# Patient Record
Sex: Male | Born: 1935 | Race: Black or African American | Hispanic: No | Smoking: Former smoker
Health system: Southern US, Community
[De-identification: ages and names within clinical notes are randomized; demographics above are authoritative.]

## PROBLEM LIST (undated history)

## (undated) DIAGNOSIS — I5022 Chronic systolic (congestive) heart failure: Secondary | ICD-10-CM

## (undated) DIAGNOSIS — K279 Peptic ulcer, site unspecified, unspecified as acute or chronic, without hemorrhage or perforation: Secondary | ICD-10-CM

## (undated) DIAGNOSIS — I2510 Atherosclerotic heart disease of native coronary artery without angina pectoris: Secondary | ICD-10-CM

## (undated) DIAGNOSIS — I10 Essential (primary) hypertension: Secondary | ICD-10-CM

## (undated) DIAGNOSIS — E785 Hyperlipidemia, unspecified: Secondary | ICD-10-CM

## (undated) DIAGNOSIS — I509 Heart failure, unspecified: Secondary | ICD-10-CM

## (undated) DIAGNOSIS — T827XXA Infection and inflammatory reaction due to other cardiac and vascular devices, implants and grafts, initial encounter: Secondary | ICD-10-CM

## (undated) DIAGNOSIS — N400 Enlarged prostate without lower urinary tract symptoms: Secondary | ICD-10-CM

## (undated) DIAGNOSIS — N189 Chronic kidney disease, unspecified: Secondary | ICD-10-CM

## (undated) DIAGNOSIS — K922 Gastrointestinal hemorrhage, unspecified: Secondary | ICD-10-CM

## (undated) DIAGNOSIS — I428 Other cardiomyopathies: Secondary | ICD-10-CM

## (undated) DIAGNOSIS — I219 Acute myocardial infarction, unspecified: Secondary | ICD-10-CM

## (undated) DIAGNOSIS — E119 Type 2 diabetes mellitus without complications: Secondary | ICD-10-CM

## (undated) DIAGNOSIS — R0602 Shortness of breath: Secondary | ICD-10-CM

## (undated) HISTORY — DX: Other cardiomyopathies: I42.8

## (undated) HISTORY — PX: ADENOIDECTOMY: SHX5191

## (undated) HISTORY — PX: DOPPLER ECHOCARDIOGRAPHY: SHX263

## (undated) HISTORY — PX: EYE SURGERY: SHX253

## (undated) HISTORY — DX: Hyperlipidemia, unspecified: E78.5

## (undated) HISTORY — PX: LACERATION REPAIR: SHX5168

## (undated) HISTORY — DX: Benign prostatic hyperplasia without lower urinary tract symptoms: N40.0

## (undated) HISTORY — DX: Type 2 diabetes mellitus without complications: E11.9

## (undated) HISTORY — PX: CATARACT EXTRACTION: SUR2

## (undated) HISTORY — PX: TONSILLECTOMY: SUR1361

## (undated) HISTORY — DX: Infection and inflammatory reaction due to other cardiac and vascular devices, implants and grafts, initial encounter: T82.7XXA

## (undated) HISTORY — DX: Chronic systolic (congestive) heart failure: I50.22

---

## 1988-09-20 DIAGNOSIS — I219 Acute myocardial infarction, unspecified: Secondary | ICD-10-CM

## 1988-09-20 HISTORY — PX: CERVICAL DISC SURGERY: SHX588

## 1988-09-20 HISTORY — DX: Acute myocardial infarction, unspecified: I21.9

## 1997-08-30 ENCOUNTER — Ambulatory Visit (HOSPITAL_COMMUNITY): Admission: RE | Admit: 1997-08-30 | Discharge: 1997-08-30 | Payer: Self-pay

## 1999-04-12 ENCOUNTER — Ambulatory Visit (HOSPITAL_COMMUNITY): Admission: RE | Admit: 1999-04-12 | Discharge: 1999-04-12 | Payer: Self-pay | Admitting: Gastroenterology

## 1999-04-12 ENCOUNTER — Encounter (INDEPENDENT_AMBULATORY_CARE_PROVIDER_SITE_OTHER): Payer: Self-pay | Admitting: Specialist

## 1999-11-07 ENCOUNTER — Inpatient Hospital Stay (HOSPITAL_COMMUNITY): Admission: EM | Admit: 1999-11-07 | Discharge: 1999-11-08 | Payer: Self-pay | Admitting: Emergency Medicine

## 1999-11-07 ENCOUNTER — Encounter: Payer: Self-pay | Admitting: Emergency Medicine

## 1999-11-08 ENCOUNTER — Encounter: Payer: Self-pay | Admitting: Internal Medicine

## 2001-02-17 ENCOUNTER — Encounter: Payer: Self-pay | Admitting: Family Medicine

## 2001-02-17 ENCOUNTER — Ambulatory Visit (HOSPITAL_COMMUNITY): Admission: RE | Admit: 2001-02-17 | Discharge: 2001-02-17 | Payer: Self-pay | Admitting: Family Medicine

## 2001-02-19 ENCOUNTER — Encounter: Payer: Self-pay | Admitting: Internal Medicine

## 2001-02-19 ENCOUNTER — Inpatient Hospital Stay (HOSPITAL_COMMUNITY): Admission: EM | Admit: 2001-02-19 | Discharge: 2001-03-01 | Payer: Self-pay | Admitting: Internal Medicine

## 2001-02-20 ENCOUNTER — Encounter: Payer: Self-pay | Admitting: Internal Medicine

## 2001-02-21 ENCOUNTER — Encounter: Payer: Self-pay | Admitting: Internal Medicine

## 2001-02-22 ENCOUNTER — Encounter: Payer: Self-pay | Admitting: Pulmonary Disease

## 2001-02-23 ENCOUNTER — Encounter: Payer: Self-pay | Admitting: Internal Medicine

## 2001-02-24 ENCOUNTER — Encounter: Payer: Self-pay | Admitting: Critical Care Medicine

## 2001-02-25 ENCOUNTER — Encounter: Payer: Self-pay | Admitting: Critical Care Medicine

## 2001-10-15 ENCOUNTER — Ambulatory Visit (HOSPITAL_BASED_OUTPATIENT_CLINIC_OR_DEPARTMENT_OTHER): Admission: RE | Admit: 2001-10-15 | Discharge: 2001-10-15 | Payer: Self-pay | Admitting: Critical Care Medicine

## 2001-10-16 ENCOUNTER — Encounter: Payer: Self-pay | Admitting: Pulmonary Disease

## 2002-01-31 ENCOUNTER — Encounter: Payer: Self-pay | Admitting: Emergency Medicine

## 2002-01-31 ENCOUNTER — Inpatient Hospital Stay (HOSPITAL_COMMUNITY): Admission: EM | Admit: 2002-01-31 | Discharge: 2002-02-03 | Payer: Self-pay | Admitting: Emergency Medicine

## 2002-05-12 ENCOUNTER — Emergency Department (HOSPITAL_COMMUNITY): Admission: EM | Admit: 2002-05-12 | Discharge: 2002-05-12 | Payer: Self-pay | Admitting: Emergency Medicine

## 2002-05-12 ENCOUNTER — Encounter: Payer: Self-pay | Admitting: Emergency Medicine

## 2002-05-15 ENCOUNTER — Encounter: Payer: Self-pay | Admitting: Emergency Medicine

## 2002-05-15 ENCOUNTER — Inpatient Hospital Stay (HOSPITAL_COMMUNITY): Admission: EM | Admit: 2002-05-15 | Discharge: 2002-05-16 | Payer: Self-pay | Admitting: Emergency Medicine

## 2002-05-16 ENCOUNTER — Encounter: Payer: Self-pay | Admitting: *Deleted

## 2002-06-22 ENCOUNTER — Ambulatory Visit (HOSPITAL_COMMUNITY): Admission: RE | Admit: 2002-06-22 | Discharge: 2002-06-22 | Payer: Self-pay | Admitting: Gastroenterology

## 2002-06-22 ENCOUNTER — Encounter (INDEPENDENT_AMBULATORY_CARE_PROVIDER_SITE_OTHER): Payer: Self-pay | Admitting: Specialist

## 2002-10-02 ENCOUNTER — Encounter: Payer: Self-pay | Admitting: Pulmonary Disease

## 2003-02-27 ENCOUNTER — Inpatient Hospital Stay (HOSPITAL_COMMUNITY): Admission: EM | Admit: 2003-02-27 | Discharge: 2003-03-02 | Payer: Self-pay | Admitting: Emergency Medicine

## 2003-07-17 ENCOUNTER — Inpatient Hospital Stay (HOSPITAL_COMMUNITY): Admission: EM | Admit: 2003-07-17 | Discharge: 2003-07-22 | Payer: Self-pay | Admitting: Emergency Medicine

## 2003-07-18 ENCOUNTER — Encounter (INDEPENDENT_AMBULATORY_CARE_PROVIDER_SITE_OTHER): Payer: Self-pay | Admitting: Interventional Cardiology

## 2003-10-10 ENCOUNTER — Ambulatory Visit: Payer: Self-pay | Admitting: Nurse Practitioner

## 2003-10-11 ENCOUNTER — Ambulatory Visit: Payer: Self-pay | Admitting: *Deleted

## 2003-11-28 ENCOUNTER — Ambulatory Visit: Payer: Self-pay | Admitting: Nurse Practitioner

## 2003-11-29 ENCOUNTER — Encounter (INDEPENDENT_AMBULATORY_CARE_PROVIDER_SITE_OTHER): Payer: Self-pay | Admitting: Interventional Cardiology

## 2003-11-30 ENCOUNTER — Inpatient Hospital Stay (HOSPITAL_COMMUNITY): Admission: EM | Admit: 2003-11-30 | Discharge: 2003-12-02 | Payer: Self-pay | Admitting: Emergency Medicine

## 2003-12-06 ENCOUNTER — Ambulatory Visit: Payer: Self-pay | Admitting: Nurse Practitioner

## 2003-12-20 ENCOUNTER — Ambulatory Visit: Payer: Self-pay | Admitting: Internal Medicine

## 2003-12-29 ENCOUNTER — Ambulatory Visit: Payer: Self-pay | Admitting: Internal Medicine

## 2004-01-04 ENCOUNTER — Ambulatory Visit: Payer: Self-pay | Admitting: Internal Medicine

## 2004-01-05 ENCOUNTER — Inpatient Hospital Stay (HOSPITAL_COMMUNITY): Admission: RE | Admit: 2004-01-05 | Discharge: 2004-01-08 | Payer: Self-pay | Admitting: Internal Medicine

## 2004-01-10 ENCOUNTER — Ambulatory Visit: Payer: Self-pay | Admitting: Internal Medicine

## 2004-01-17 ENCOUNTER — Ambulatory Visit: Payer: Self-pay | Admitting: Internal Medicine

## 2004-01-17 ENCOUNTER — Ambulatory Visit: Payer: Self-pay

## 2004-01-18 ENCOUNTER — Ambulatory Visit: Payer: Self-pay | Admitting: Nurse Practitioner

## 2004-05-27 ENCOUNTER — Ambulatory Visit: Payer: Self-pay | Admitting: Internal Medicine

## 2004-05-28 IMAGING — CR DG CHEST 2V
2 series · 2 of 2 positions shown · non-contrast
Comparison: none

CLINICAL DATA: Cough, shortness of breath.
 CHEST TWO VIEW
 Comparison 02/27/03.  
 There has been some increase in central peribronchial thickening, with increasing in perihilar interstitial infiltrates or edema.  There is no effusion.  Heart size mildly enlarged, stable.
 IMPRESSION 
 Increase in peribronchial disease and perihilar interstitial infiltrates or edema.

[view not recorded (1 of 2)]
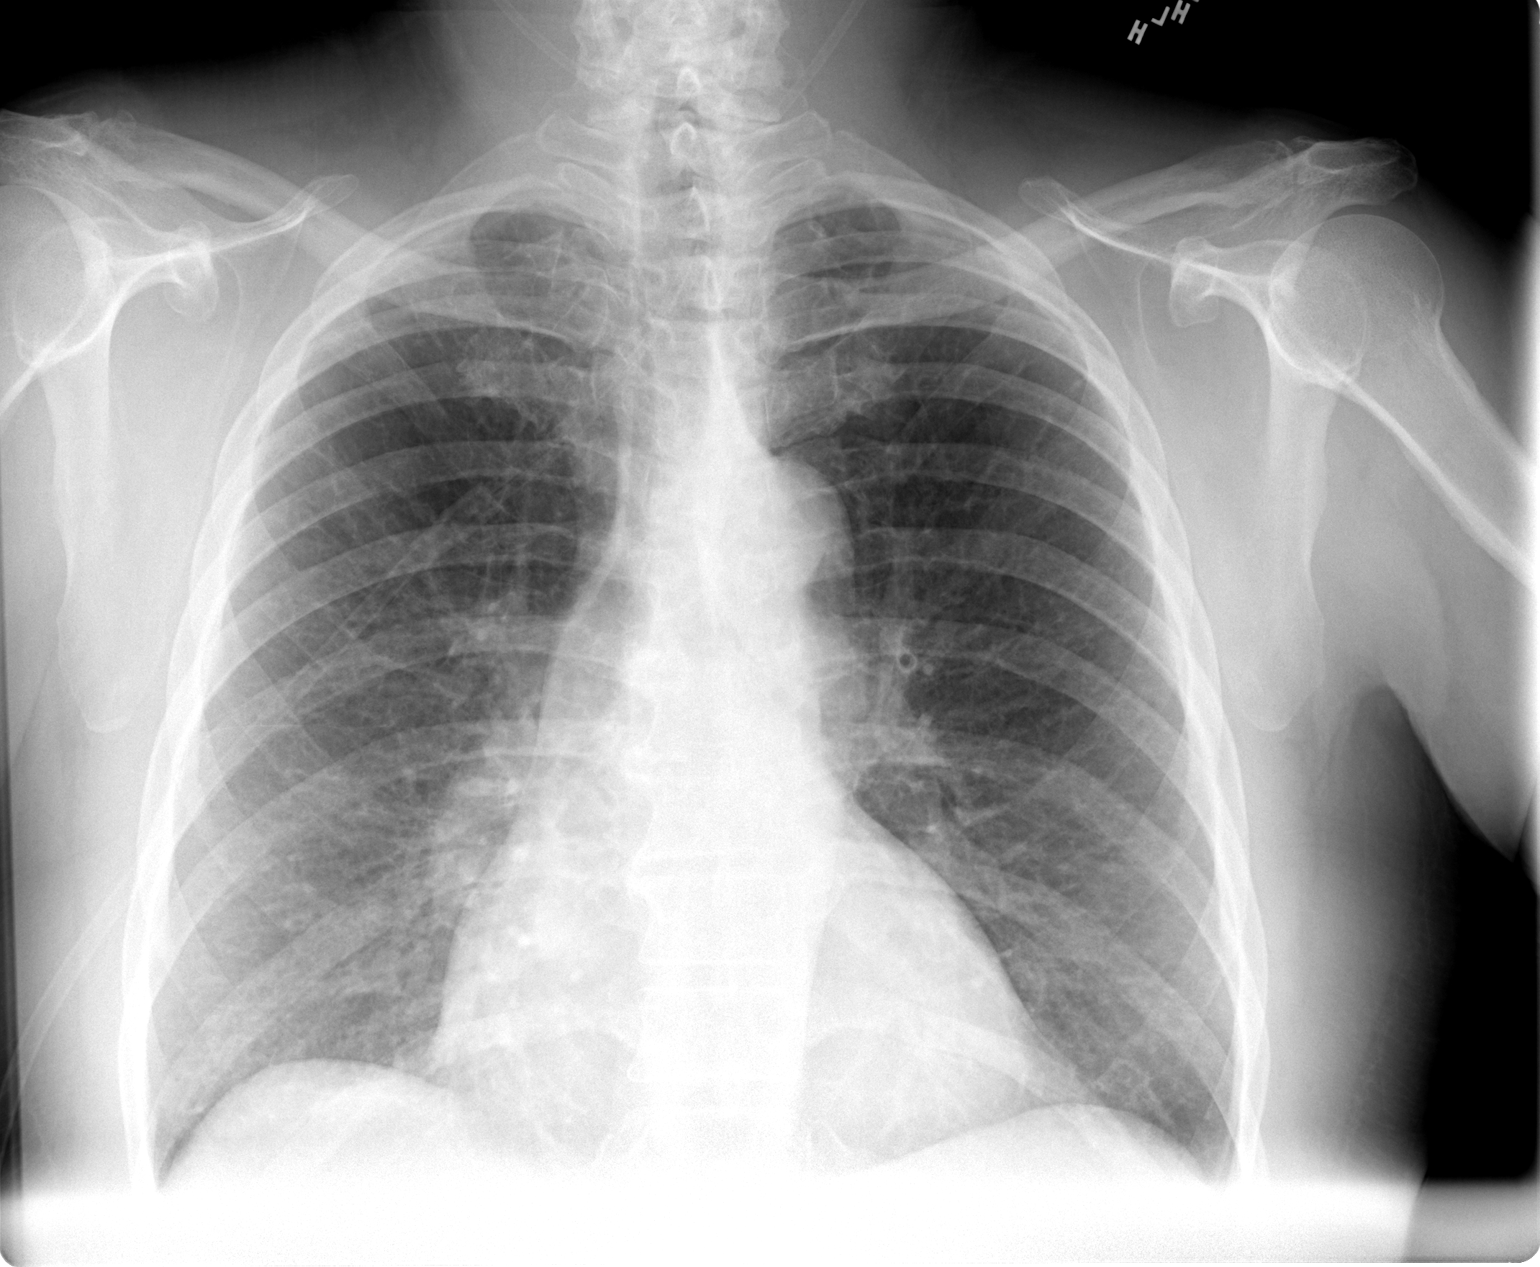

[view not recorded (2 of 2)]
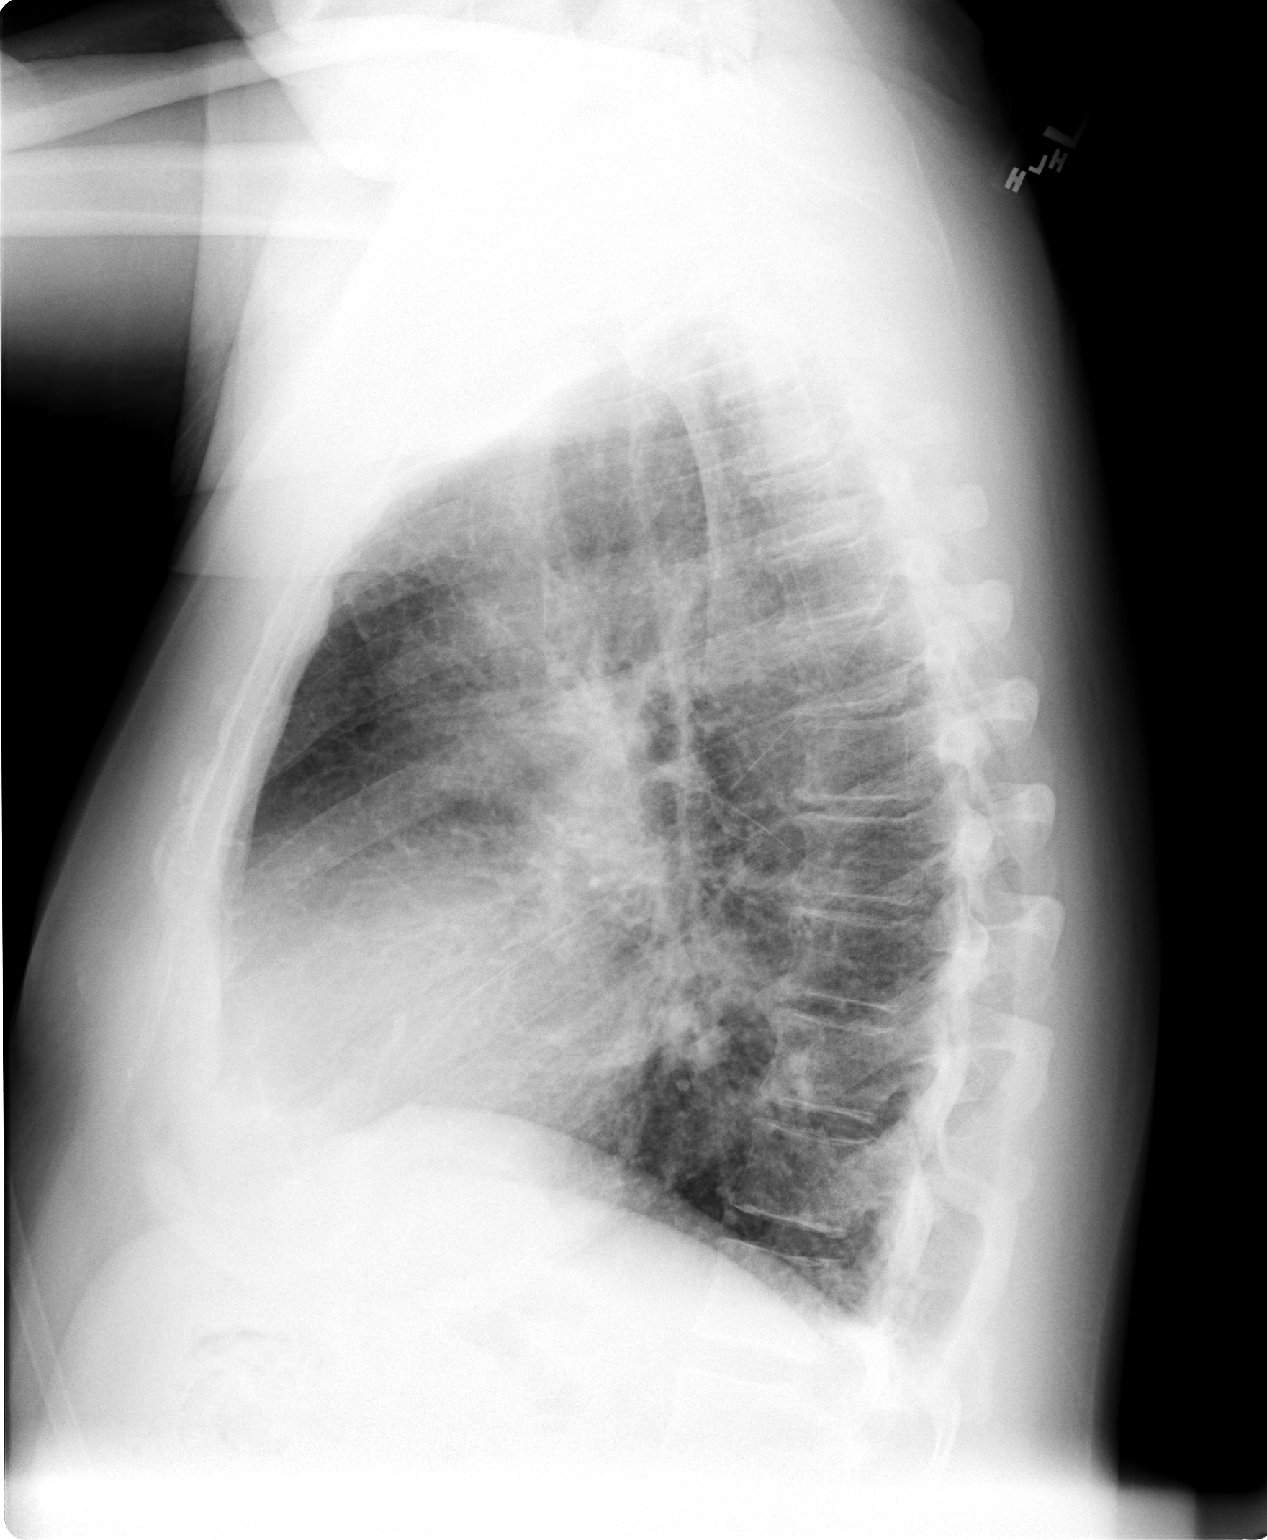

[2 of 2 positions shown; findings below may reference images not displayed]

## 2004-12-10 ENCOUNTER — Inpatient Hospital Stay (HOSPITAL_COMMUNITY): Admission: EM | Admit: 2004-12-10 | Discharge: 2004-12-14 | Payer: Self-pay | Admitting: Emergency Medicine

## 2005-03-21 ENCOUNTER — Ambulatory Visit: Payer: Self-pay | Admitting: Internal Medicine

## 2005-06-03 ENCOUNTER — Emergency Department (HOSPITAL_COMMUNITY): Admission: EM | Admit: 2005-06-03 | Discharge: 2005-06-03 | Payer: Self-pay | Admitting: Emergency Medicine

## 2005-09-11 ENCOUNTER — Ambulatory Visit: Payer: Self-pay | Admitting: Internal Medicine

## 2005-10-02 ENCOUNTER — Ambulatory Visit: Payer: Self-pay

## 2005-12-11 ENCOUNTER — Ambulatory Visit: Payer: Self-pay | Admitting: Internal Medicine

## 2006-02-13 ENCOUNTER — Ambulatory Visit: Payer: Self-pay | Admitting: Internal Medicine

## 2006-03-12 ENCOUNTER — Ambulatory Visit: Payer: Self-pay | Admitting: Internal Medicine

## 2006-06-11 ENCOUNTER — Ambulatory Visit: Payer: Self-pay | Admitting: Internal Medicine

## 2006-10-20 ENCOUNTER — Ambulatory Visit: Payer: Self-pay | Admitting: Internal Medicine

## 2007-01-28 ENCOUNTER — Ambulatory Visit: Payer: Self-pay | Admitting: Internal Medicine

## 2007-02-22 ENCOUNTER — Ambulatory Visit: Payer: Self-pay | Admitting: Internal Medicine

## 2007-05-10 ENCOUNTER — Ambulatory Visit: Payer: Self-pay | Admitting: Internal Medicine

## 2007-06-24 ENCOUNTER — Ambulatory Visit: Payer: Self-pay | Admitting: Internal Medicine

## 2007-06-29 ENCOUNTER — Ambulatory Visit: Payer: Self-pay | Admitting: Internal Medicine

## 2007-07-26 ENCOUNTER — Ambulatory Visit: Payer: Self-pay | Admitting: Internal Medicine

## 2007-07-26 LAB — CONVERTED CEMR LAB
BUN: 15 mg/dL (ref 6–23)
Basophils Absolute: 0 10*3/uL (ref 0.0–0.1)
Basophils Relative: 0.1 % (ref 0.0–1.0)
CO2: 33 meq/L — ABNORMAL HIGH (ref 19–32)
Calcium: 8.2 mg/dL — ABNORMAL LOW (ref 8.4–10.5)
Chloride: 104 meq/L (ref 96–112)
Creatinine, Ser: 1.3 mg/dL (ref 0.4–1.5)
Eosinophils Absolute: 0.2 10*3/uL (ref 0.0–0.7)
Eosinophils Relative: 2.6 % (ref 0.0–5.0)
GFR calc Af Amer: 69 mL/min
GFR calc non Af Amer: 57 mL/min
Glucose, Bld: 204 mg/dL — ABNORMAL HIGH (ref 70–99)
HCT: 34.1 % — ABNORMAL LOW (ref 39.0–52.0)
Hemoglobin: 11.3 g/dL — ABNORMAL LOW (ref 13.0–17.0)
INR: 1.7 — ABNORMAL HIGH (ref 0.8–1.0)
Lymphocytes Relative: 15 % (ref 12.0–46.0)
MCHC: 33.2 g/dL (ref 30.0–36.0)
MCV: 80 fL (ref 78.0–100.0)
Monocytes Absolute: 0.5 10*3/uL (ref 0.1–1.0)
Monocytes Relative: 6.8 % (ref 3.0–12.0)
Neutro Abs: 5.5 10*3/uL (ref 1.4–7.7)
Neutrophils Relative %: 75.5 % (ref 43.0–77.0)
Platelets: 225 10*3/uL (ref 150–400)
Potassium: 4.1 meq/L (ref 3.5–5.1)
Prothrombin Time: 18.7 s — ABNORMAL HIGH (ref 10.9–13.3)
RBC: 4.26 M/uL (ref 4.22–5.81)
RDW: 19.5 % — ABNORMAL HIGH (ref 11.5–14.6)
Sodium: 143 meq/L (ref 135–145)
WBC: 7.3 10*3/uL (ref 4.5–10.5)
aPTT: 33.6 s — ABNORMAL HIGH (ref 21.7–29.8)

## 2007-08-02 ENCOUNTER — Inpatient Hospital Stay (HOSPITAL_COMMUNITY): Admission: RE | Admit: 2007-08-02 | Discharge: 2007-08-02 | Payer: Self-pay | Admitting: Internal Medicine

## 2007-08-02 ENCOUNTER — Ambulatory Visit: Payer: Self-pay | Admitting: Internal Medicine

## 2007-08-18 ENCOUNTER — Ambulatory Visit: Payer: Self-pay

## 2007-08-24 ENCOUNTER — Ambulatory Visit: Payer: Self-pay | Admitting: Internal Medicine

## 2007-09-01 ENCOUNTER — Ambulatory Visit: Payer: Self-pay | Admitting: Internal Medicine

## 2007-10-26 ENCOUNTER — Ambulatory Visit: Payer: Self-pay | Admitting: Internal Medicine

## 2007-11-02 ENCOUNTER — Ambulatory Visit (HOSPITAL_COMMUNITY): Admission: RE | Admit: 2007-11-02 | Discharge: 2007-11-02 | Payer: Self-pay | Admitting: Gastroenterology

## 2008-01-24 ENCOUNTER — Ambulatory Visit: Payer: Self-pay | Admitting: Internal Medicine

## 2008-04-04 ENCOUNTER — Encounter: Payer: Self-pay | Admitting: Internal Medicine

## 2008-04-24 ENCOUNTER — Ambulatory Visit: Payer: Self-pay | Admitting: Internal Medicine

## 2008-06-07 DIAGNOSIS — I5022 Chronic systolic (congestive) heart failure: Secondary | ICD-10-CM

## 2008-06-07 DIAGNOSIS — I429 Cardiomyopathy, unspecified: Secondary | ICD-10-CM | POA: Insufficient documentation

## 2008-06-07 DIAGNOSIS — J449 Chronic obstructive pulmonary disease, unspecified: Secondary | ICD-10-CM

## 2008-06-07 DIAGNOSIS — J4489 Other specified chronic obstructive pulmonary disease: Secondary | ICD-10-CM | POA: Insufficient documentation

## 2008-09-19 ENCOUNTER — Ambulatory Visit: Payer: Self-pay | Admitting: Internal Medicine

## 2008-12-17 ENCOUNTER — Encounter: Payer: Self-pay | Admitting: Internal Medicine

## 2008-12-18 ENCOUNTER — Ambulatory Visit: Payer: Self-pay | Admitting: Internal Medicine

## 2008-12-27 ENCOUNTER — Encounter: Payer: Self-pay | Admitting: Internal Medicine

## 2009-01-12 ENCOUNTER — Emergency Department (HOSPITAL_COMMUNITY): Admission: EM | Admit: 2009-01-12 | Discharge: 2009-01-12 | Payer: Self-pay | Admitting: Emergency Medicine

## 2009-02-28 ENCOUNTER — Inpatient Hospital Stay (HOSPITAL_COMMUNITY): Admission: EM | Admit: 2009-02-28 | Discharge: 2009-03-02 | Payer: Self-pay | Admitting: Emergency Medicine

## 2009-03-01 ENCOUNTER — Encounter (INDEPENDENT_AMBULATORY_CARE_PROVIDER_SITE_OTHER): Payer: Self-pay | Admitting: Internal Medicine

## 2009-03-19 ENCOUNTER — Ambulatory Visit: Payer: Self-pay | Admitting: Internal Medicine

## 2009-03-27 ENCOUNTER — Encounter: Payer: Self-pay | Admitting: Internal Medicine

## 2009-06-18 ENCOUNTER — Ambulatory Visit: Payer: Self-pay | Admitting: Internal Medicine

## 2009-07-19 ENCOUNTER — Encounter: Payer: Self-pay | Admitting: Internal Medicine

## 2009-10-09 ENCOUNTER — Ambulatory Visit: Payer: Self-pay | Admitting: Internal Medicine

## 2009-10-25 ENCOUNTER — Telehealth: Payer: Self-pay | Admitting: Pulmonary Disease

## 2009-11-19 ENCOUNTER — Ambulatory Visit: Payer: Self-pay | Admitting: Pulmonary Disease

## 2009-11-19 DIAGNOSIS — J45909 Unspecified asthma, uncomplicated: Secondary | ICD-10-CM | POA: Insufficient documentation

## 2009-11-19 DIAGNOSIS — J439 Emphysema, unspecified: Secondary | ICD-10-CM

## 2009-11-19 DIAGNOSIS — J9610 Chronic respiratory failure, unspecified whether with hypoxia or hypercapnia: Secondary | ICD-10-CM | POA: Insufficient documentation

## 2009-11-19 DIAGNOSIS — E785 Hyperlipidemia, unspecified: Secondary | ICD-10-CM | POA: Insufficient documentation

## 2009-11-19 DIAGNOSIS — I6789 Other cerebrovascular disease: Secondary | ICD-10-CM

## 2009-11-29 ENCOUNTER — Encounter: Payer: Self-pay | Admitting: Pulmonary Disease

## 2009-12-25 ENCOUNTER — Encounter (HOSPITAL_COMMUNITY)
Admission: RE | Admit: 2009-12-25 | Discharge: 2010-02-19 | Payer: Self-pay | Source: Home / Self Care | Attending: Pulmonary Disease | Admitting: Pulmonary Disease

## 2010-01-10 ENCOUNTER — Ambulatory Visit: Payer: Self-pay | Admitting: Internal Medicine

## 2010-01-11 ENCOUNTER — Encounter: Payer: Self-pay | Admitting: Internal Medicine

## 2010-02-17 ENCOUNTER — Encounter (INDEPENDENT_AMBULATORY_CARE_PROVIDER_SITE_OTHER): Payer: Self-pay | Admitting: *Deleted

## 2010-02-18 ENCOUNTER — Ambulatory Visit
Admission: RE | Admit: 2010-02-18 | Discharge: 2010-02-18 | Payer: Self-pay | Source: Home / Self Care | Attending: Pulmonary Disease | Admitting: Pulmonary Disease

## 2010-02-21 ENCOUNTER — Encounter (HOSPITAL_COMMUNITY): Payer: Medicare Other | Attending: Pulmonary Disease

## 2010-02-21 DIAGNOSIS — J449 Chronic obstructive pulmonary disease, unspecified: Secondary | ICD-10-CM | POA: Insufficient documentation

## 2010-02-21 DIAGNOSIS — Z8679 Personal history of other diseases of the circulatory system: Secondary | ICD-10-CM | POA: Insufficient documentation

## 2010-02-21 DIAGNOSIS — I252 Old myocardial infarction: Secondary | ICD-10-CM | POA: Insufficient documentation

## 2010-02-21 DIAGNOSIS — J438 Other emphysema: Secondary | ICD-10-CM | POA: Insufficient documentation

## 2010-02-21 DIAGNOSIS — E785 Hyperlipidemia, unspecified: Secondary | ICD-10-CM | POA: Insufficient documentation

## 2010-02-21 DIAGNOSIS — Z5189 Encounter for other specified aftercare: Secondary | ICD-10-CM | POA: Insufficient documentation

## 2010-02-21 DIAGNOSIS — I5022 Chronic systolic (congestive) heart failure: Secondary | ICD-10-CM | POA: Insufficient documentation

## 2010-02-21 DIAGNOSIS — Z95810 Presence of automatic (implantable) cardiac defibrillator: Secondary | ICD-10-CM | POA: Insufficient documentation

## 2010-02-21 DIAGNOSIS — I429 Cardiomyopathy, unspecified: Secondary | ICD-10-CM | POA: Insufficient documentation

## 2010-02-21 DIAGNOSIS — J9610 Chronic respiratory failure, unspecified whether with hypoxia or hypercapnia: Secondary | ICD-10-CM | POA: Insufficient documentation

## 2010-02-21 DIAGNOSIS — J4489 Other specified chronic obstructive pulmonary disease: Secondary | ICD-10-CM | POA: Insufficient documentation

## 2010-02-21 NOTE — Cardiovascular Report (Signed)
Summary: Office Visit Remote   Office Visit Remote   Imported By: Roderic Ovens 07/20/2009 16:30:06  _____________________________________________________________________  External Attachment:    Type:   Image     Comment:   External Document

## 2010-02-21 NOTE — Miscellaneous (Signed)
  Clinical Lists Changes  Orders: Added new Referral order of DME Referral (DME) - Signed ONO on 3 lpm shows sats less than 88% for 4 1/2 hours.  Will need to have oxygen increased at hs

## 2010-02-21 NOTE — Assessment & Plan Note (Signed)
Summary: [facility]   Visit Type:  Follow-up  CC:  Pt did not bring current med list and is  not sure what medications he is on.  History of Present Illness: Robert Davies is seen in followup for CHF in the setting of nonischemic cardiomyopathy;  He is s/p CRT-D implantation.  He also has O2 dependent COPD  He is havig increasing shornesss of breath    Allergies (verified): No Known Drug Allergies  Past History:  Past Medical History: Last updated: 06/07/2008 ICD - IN SITU (ICD-V45.02) SYSTOLIC HEART FAILURE, CHRONIC (ICD-428.22) CARDIOMYOPATHY, SECONDARY (ICD-425.9) COPD (ICD-496)    Vital Signs:  Patient profile:   77 year old male Height:      67 inches Weight:      200 pounds BMI:     31.44 O2 Sat:      90 % Pulse rate:   90 / minute BP sitting:   128 / 70  (left arm)  Vitals Entered By: Laurance Flatten CMA (October 09, 2009 12:32 PM) CC: Pt did not bring current med list and is  not sure what medications he is on   Physical Exam  General:  The patient was alert and oriented in mod respiratory distress using O2 HEENT Normal.  Neck veins were flat, carotids were brisk.  Lungs markedly decresed beeaths sounds Heart sounds were regular without murmurs or gallops.  Abdomen was soft with active bowel sounds. There is no clubbing cyanosis or edema. Skin Warm and dry     ICD Specifications Following MD:  Sherryl Manges, MD     ICD Vendor:  [employer] Jude     ICD Model Number:  [id number]     ICD Serial Number:  [id number] ICD DOI:  08/02/2007     ICD Implanting MD:  Sherryl Manges, MD  Lead 1:    Location: RA     DOI: 08/02/2007     Model #: [id number]     Serial #: [id number]     Status: active Lead 2:    Location: RV     DOI: 08/02/2007     Model #: [id number]     Serial #: [id number]     Status: active Lead 3:    Location: LV     DOI: 08/02/2007     Model #: [id number]     Serial #: [id number]     Status: active  Indications::  NICM, CHF   ICD Follow Up Battery Voltage:  3.11 V     Charge Time:  11.6  seconds     Battery Est. Longevity:  4.2-5.1 YRS Underlying rhythm:  SR ICD Dependent:  No       ICD Device Measurements Atrium:  Amplitude: 5.0 mV, Impedance: 490 ohms, Threshold: 0.75 V at 0.5 msec Right Ventricle:  Amplitude: 11.0 mV, Impedance: 410 ohms, Threshold: 1.0 V at 0.5 msec Left Ventricle:  Impedance: 840 ohms, Threshold: 0.75 V at 0.40 msec  Episodes MS Episodes:  0     Coumadin:  Yes Shock:  0     ATP:  0     Nonsustained:  0     Atrial Therapies:  0 Atrial Pacing:  <1%     Ventricular Pacing:  99%  Brady Parameters Mode DDD     Lower Rate Limit:  40     Upper Rate Limit 110 PAV 90     Sensed AV Delay:  90  Tachy Zones VF:  240     VT:  200  VT1:  181     Next Remote Date:  01/10/2010     Next Cardiology Appt Due:  09/23/2010 Tech Comments:  NORMAL DEVICE FUNCTION.  NO EPISODES SINCE LAST CHECK.  TURNED RATE RESPONSE ON DURING MODE SWITCH.  CHANGED RV OUTPUT FROM 2.0 TO 2.5 AND LV PULSE WIDTH FROM 0.8 TO 1.72ms.  MERLIN TRANSMISSION 01-10-10. ROV IN 12 MTHS W/SK.  Vella Kohler  October 09, 2009 12:43 PM  Impression & Recommendations:  Problem # 1:  COPD (ICD-496) Have made a refreral to Pulm for their expertise His updated medication list for this problem includes:    Advair Diskus 250-50 Mcg/dose Aepb (Fluticasone-salmeterol) .Marland Kitchen... 2 puffs two times a day    Spiriva Handihaler 18 Mcg Caps (Tiotropium bromide monohydrate) ..... Once a day    Albuterol Sulfate (2.5 Mg/53ml) 0.083% Nebu (Albuterol sulfate) .Marland Kitchen..Marland Kitchen Two times a day  Problem # 2:  SYSTOLIC HEART FAILURE, CHRONIC (ICD-428.22) stable on meds which are inferredd but not confirmed His updated medication list for this problem includes:    Aspirin 81 Mg Tbec (Aspirin) .Marland Kitchen... Take one tablet by mouth daily    Furosemide 40 Mg Tabs (Furosemide) .Marland Kitchen... Take one tablet by mouth daily.    Metoprolol Succinate 50 Mg Xr24h-tab (Metoprolol succinate) .Marland Kitchen... Take one tablet by mouth daily    Enalapril Maleate 10 Mg  Tabs (Enalapril maleate) .Marland Kitchen... Take one tablet by mouth daily    Warfarin Sodium 5 Mg Tabs (Warfarin sodium) ..... Use as directed by anticoagulation clinic  Problem # 3:  ICD-CRT ST J (ICD-V45.02)  Device parameters and data were reviewed and no changes were made  Problem # 4:  CARDIOMYOPATHY, SECONDARY (ICD-425.9) continue current meds   Followup with Dr Gwendolyn Fill as scheduled His updated medication list for this problem includes:    Aspirin 81 Mg Tbec (Aspirin) .Marland Kitchen... Take one tablet by mouth daily    Furosemide 40 Mg Tabs (Furosemide) .Marland Kitchen... Take one tablet by mouth daily.    Metoprolol Succinate 50 Mg Xr24h-tab (Metoprolol succinate) .Marland Kitchen... Take one tablet by mouth daily    Enalapril Maleate 10 Mg Tabs (Enalapril maleate) .Marland Kitchen... Take one tablet by mouth daily    Warfarin Sodium 5 Mg Tabs (Warfarin sodium) ..... Use as directed by anticoagulation clinic  Patient Instructions: 1)  Your physician recommends that you continue on your current medications as directed. Please refer to the Current Medication list given to you today. 2)  Your physician wants you to follow-up in: 1 year  You will receive a reminder letter in the mail two months in advance. If you don't receive a letter, please call our office to schedule the follow-up appointment. 3)  You have been referred to Pulmonary for COPD.

## 2010-02-21 NOTE — Assessment & Plan Note (Signed)
Summary: consult for dyspnea   Copy to:  Jarrett Soho Primary Yonah Tangeman/Referring Kylani Wires:  Georgann Housekeeper MD  CC:  pt states he gets short winded with any actiivity and at rest. pt c/o cough with clear phlem.Marland Kitchen  History of Present Illness: the pt is a 77y/o male who I have been asked to see for dyspnea.  He has a long h/o smoking, and currently is smoking a ppd.  He had pfts in 2004 which showed an FEV1 of 1.32 with ratio of 54%, c/w moderat airflow obstruction.  He also had significant airtrapping and a DLCO of 51% at that time.  He currently describes less than one block doe at a moderate pace on flat ground, and some days gets winded just walking thru his house.  He has cough with white foamy mucus, but is better when he smokes less.  He has been having LE edema.  He currently uses advair and spiriva, and oxygen as needed.    Preventive Screening-Counseling & Management  Alcohol-Tobacco     Smoking Status: current  Current Medications (verified): 1)  Simvastatin 20 Mg Tabs (Simvastatin) .... Take One Tablet By Mouth Daily At Bedtime 2)  Potassium Chloride Cr 10 Meq Cr-Caps (Potassium Chloride) .... Take One Tablet By Mouth Twice A Day 3)  Furosemide 40 Mg Tabs (Furosemide) .... Take One Tablet By Mouth Daily. 4)  Metoprolol Succinate 50 Mg Xr24h-Tab (Metoprolol Succinate) .... Take One Tablet By Mouth Daily 5)  Glimepiride 2 Mg Tabs (Glimepiride) .... Take 1 Tablet By Mouth Two Times A Day 6)  Warfarin Sodium 5 Mg Tabs (Warfarin Sodium) .... Use As Directed By Anticoagulation Clinic 7)  Advair Diskus 250-50 Mcg/dose Aepb (Fluticasone-Salmeterol) .... One Puff  Two Times A Day 8)  Spiriva Handihaler 18 Mcg Caps (Tiotropium Bromide Monohydrate) .... Once A Day 9)  Albuterol Sulfate (2.5 Mg/97ml) 0.083% Nebu (Albuterol Sulfate) .... Two Times A Day 10)  Dorzolamide Hcl 2 % Soln (Dorzolamide Hcl) .... One Drop Each Eye Two Times A Day 11)  Oxygen 2.5 Litters .... As Needed 12)  Chantix 1  Mg Tabs (Varenicline Tartrate) .... One Tablet Two Times A Day 13)  Ferrex 150 150 Mg Caps (Polysaccharide Iron Complex) .... Once Daily 14)  Bisacodyl Ec 5 Mg Tbec (Bisacodyl) .... Once Daily 15)  Pepcid 20 Mg Tabs (Famotidine) .... Once Daily At Bedtime 16)  Flonase 50 Mcg/act Susp (Fluticasone Propionate) .Marland Kitchen.. 1 Spray Each Nostrile Once Daily 17)  Flomax 0.4 Mg Caps (Tamsulosin Hcl) .... Once Daily  Allergies (verified): 1)  ! Adhesive Tape  Past History:  Past Medical History: Emphysema C V A / STROKE (ICD-436) HYPERLIPIDEMIA (ICD-272.4 Hx of MYOCARDIAL INFARCTION (ICD-410.90) ICD-CRT ST J (ICD-V45.02) SYSTOLIC HEART FAILURE, CHRONIC (ICD-428.22) CARDIOMYOPATHY, SECONDARY (ICD-425.9)     Past Surgical History: Cervical diskectomy Tonsillectomy  adenoidectomy pacemaker x2  Family History: Reviewed history from 06/07/2008 and no changes required. Family History of Cancer: mother, father, daughter  Social History: Reviewed history from 06/07/2008 and no changes required. Divorced  occupation--retired truck driver Tobacco Use - Yes.  Alcohol Use - no  started age 68. 1-2 1/2 ppd Patient is a current smoker.   Review of Systems       The patient complains of shortness of breath with activity, shortness of breath at rest, productive cough, chest pain, difficulty swallowing, sore throat, tooth/dental problems, headaches, nasal congestion/difficulty breathing through nose, hand/feet swelling, joint stiffness or pain, and rash.  The patient denies non-productive cough, coughing up blood,  irregular heartbeats, acid heartburn, indigestion, loss of appetite, weight change, abdominal pain, sneezing, itching, ear ache, anxiety, depression, change in color of mucus, and fever.    Vital Signs:  Patient profile:   77 year old male Height:      67 inches Weight:      204.38 pounds O2 Sat:      87 % on 2.5 L/min pulsed Temp:     98.1 degrees F oral Pulse rate:   85 / minute BP  sitting:   122 / 68  (left arm) Cuff size:   regular  Vitals Entered By: Carver Fila (November 19, 2009 2:32 PM)  O2 Flow:  2.5 L/min pulsed CC: pt states he gets short winded with any actiivity and at rest. pt c/o cough with clear phlem. Comments pt went up to 91% 2.5 liters pulsed and heart rate 85 meds and allergies updated Phone number updated Carver Fila  November 19, 2009 2:33 PM    Physical Exam  General:  obese male in nad Eyes:  PERRLA and EOMI.   Nose:  patent without discharge Mouth:  clear, no exudates or lesions Neck:  no jvd, tmg, LN Lungs:  decreased bs throughout, no wheezing or rhonchi Heart:  rrr, no mrg Abdomen:  soft and nontender, bs+ Extremities:  2+ edema, no cyanosis Neurologic:  alert and oriented, moves all 4.   Impression & Recommendations:  Problem # 1:  EMPHYSEMA (ICD-492.8) The pt has known moderate emphysema from pfts 2004, and likely is much worse with his ongoing smoking since that time.  He also has a severe CM with contributes to his signfiicant doe.  He is on an excellent bronchodilator regimen, and I have stressed to him the need to totally quit smoking.  That will help him more than any other medication or intervention.  He is on chantix and trying.  He will benefit from adjustments to his oxygen, and will also refer to pulmonary rehab to help with his endurance.    Problem # 2:  CHRONIC RESPIRATORY FAILURE (ICD-518.83) secondary to his emphysema and known cardiomyopathy  Medications Added to Medication List This Visit: 1)  Advair Diskus 250-50 Mcg/dose Aepb (Fluticasone-salmeterol) .... One puff  two times a day 2)  Oxygen 2.5 Litters  .... As needed 3)  Chantix 1 Mg Tabs (Varenicline tartrate) .... One tablet two times a day 4)  Ferrex 150 150 Mg Caps (Polysaccharide iron complex) .... Once daily 5)  Bisacodyl Ec 5 Mg Tbec (Bisacodyl) .... Once daily 6)  Pepcid 20 Mg Tabs (Famotidine) .... Once daily at bedtime 7)  Flonase 50 Mcg/act  Susp (Fluticasone propionate) .Marland Kitchen.. 1 spray each nostrile once daily 8)  Flomax 0.4 Mg Caps (Tamsulosin hcl) .... Once daily  Other Orders: Consultation Level IV ([address]) DME Referral (DME) Rehabilitation Referral (Rehab) T-2 View CXR (71020TC)  Patient Instructions: 1)  make sure you only take ONE puff of advair two times a day 2)  stay on spiriva one each am 3)  continue with chantix and stop smoking. 4)  increase oxygen to 3 liters when walking around rather than your current 2-2.5 liters. 5)  will check cxr today, and call you with results. 6)  will check oxygen level overnight to make sure you are getting enough.  Will call with results. 7)  will refer you to pulmonary rehab at [facility] 8)  followup with me in 3mos.   Immunization History:  Influenza Immunization History:    Influenza:  historical (  09/20/2009)  

## 2010-02-21 NOTE — Cardiovascular Report (Signed)
Summary: Office Visit Remote   Office Visit Remote   Imported By: Roderic Ovens 03/28/2009 11:08:25  _____________________________________________________________________  External Attachment:    Type:   Image     Comment:   External Document

## 2010-02-21 NOTE — Cardiovascular Report (Signed)
Summary: Office Visit   Office Visit   Imported By: Roderic Ovens 10/16/2009 15:08:32  _____________________________________________________________________  External Attachment:    Type:   Image     Comment:   External Document

## 2010-02-21 NOTE — Letter (Signed)
Summary: Remote Device Check  [facility], Main Office  [address]   [place], [place] [address]   Phone: [phone]  Fax: [phone]     July 19, 2009 MRN: [id number]   [facility] [address] [place], [place]  [address]   Dear Mr. PFEFFERLE,   Your remote transmission was recieved and reviewed by your physician.  All diagnostics were within normal limits for you.   ___X___Your next office visit is scheduled for:   September 2011 with Dr Graciela Husbands.                    Please call our office to schedule an appointment.    Sincerely,  Vella Kohler

## 2010-02-21 NOTE — Progress Notes (Signed)
Summary: [facility]   Imported By: Sherian Rein 11/21/2009 07:06:45  _____________________________________________________________________  External Attachment:    Type:   Image     Comment:   External Document

## 2010-02-21 NOTE — Progress Notes (Signed)
Summary: nos appt  Phone Note Call from Patient   Caller: [email or web address]  Call For: clance Summary of Call: Rsc nos from 10/5 to 10/31. Initial call taken by: Darletta Moll,  October 25, 2009 9:11 AM

## 2010-02-21 NOTE — Letter (Signed)
Summary: Remote Device Check  [facility], Main Office  [address]   [place], [place] [address]   Phone: [phone]  Fax: [phone]     March 27, 2009 MRN: [id number]   [facility] [address] [place], [place]  [address]   Dear Mr. DOCKEN,   Your remote transmission was recieved and reviewed by your physician.  All diagnostics were within normal limits for you.  __X___Your next transmission is scheduled for: Jun 18, 2009.  Please transmit at any time this day.  If you have a wireless device your transmission will be sent automatically.     Sincerely,  Proofreader

## 2010-02-26 ENCOUNTER — Encounter (HOSPITAL_COMMUNITY): Payer: Medicare Other

## 2010-02-27 NOTE — Cardiovascular Report (Signed)
Summary: Office Visit Remote   Office Visit Remote   Imported By: Roderic Ovens 02/18/2010 12:08:58  _____________________________________________________________________  External Attachment:    Type:   Image     Comment:   External Document

## 2010-02-27 NOTE — Letter (Signed)
Summary: Remote Device Check  [facility], Main Office  [address]   [place], [place] [address]   Phone: [phone]  Fax: [phone]     February 17, 2010 MRN: [id number]   [facility] [address] [place], [place]  [address]   Dear Mr. SEHGAL,   Your remote transmission was recieved and reviewed by your physician.  All diagnostics were within normal limits for you.  __X___Your next transmission is scheduled for: 04-11-2010.  Please transmit at any time this day.  If you have a wireless device your transmission will be sent automatically.    Sincerely,  Vella Kohler

## 2010-02-28 ENCOUNTER — Encounter (HOSPITAL_COMMUNITY): Payer: Medicare Other

## 2010-03-04 ENCOUNTER — Telehealth: Payer: Self-pay | Admitting: Pulmonary Disease

## 2010-03-05 ENCOUNTER — Encounter (HOSPITAL_COMMUNITY): Payer: Medicare Other

## 2010-03-07 ENCOUNTER — Encounter (HOSPITAL_COMMUNITY): Payer: Medicare Other

## 2010-03-07 NOTE — Assessment & Plan Note (Signed)
Summary: rov for emphysema   Copy to:  Jarrett Soho Primary Provider/Referring Provider:  Georgann Housekeeper MD  CC:  3 month f/u appt Emphysema.  Pt states he has quit smoking.  Pt c/o sob with exertion and a non-productive cough.  Pt states he is going to [employer]. Marland Kitchen  History of Present Illness: the pt comes in today for f/u of his known emphysema.  He has quit smoking since the last visit, and is participating in pulmonary rehab.  He is staying compliant with his bronchodilator regimen.  He has persistent doe as expected with his underlying medical issues, but feels he is improving.  He has a mild dry cough with minimal nonpurulent mucus.  Current Medications (verified): 1)  Simvastatin 20 Mg Tabs (Simvastatin) .... Take One Tablet By Mouth Daily At Bedtime 2)  Potassium Chloride Cr 10 Meq Cr-Caps (Potassium Chloride) .... Take One Tablet By Mouth Twice A Day 3)  Furosemide 40 Mg Tabs (Furosemide) .... Take One Tablet By Mouth Daily. 4)  Metoprolol Succinate 50 Mg Xr24h-Tab (Metoprolol Succinate) .... Take One Tablet By Mouth Daily 5)  Glimepiride 2 Mg Tabs (Glimepiride) .... Take 1 Tablet By Mouth Two Times A Day 6)  Warfarin Sodium 5 Mg Tabs (Warfarin Sodium) .... Use As Directed By Anticoagulation Clinic 7)  Advair Diskus 250-50 Mcg/dose Aepb (Fluticasone-Salmeterol) .... One Puff  Two Times A Day 8)  Spiriva Handihaler 18 Mcg Caps (Tiotropium Bromide Monohydrate) .... Once A Day 9)  Albuterol Sulfate (2.5 Mg/51ml) 0.083% Nebu (Albuterol Sulfate) .... Two Times A Day 10)  Dorzolamide Hcl 2 % Soln (Dorzolamide Hcl) .... One Drop Each Eye Two Times A Day 11)  Oxygen 12)  Ferrex 150 150 Mg Caps (Polysaccharide Iron Complex) .... Once Daily 13)  Bisacodyl Ec 5 Mg Tbec (Bisacodyl) .... Once Daily 14)  Pepcid 20 Mg Tabs (Famotidine) .... Once Daily At Bedtime 15)  Flonase 50 Mcg/act Susp (Fluticasone Propionate) .Marland Kitchen.. 1 Spray Each Nostrile Once Daily As Needed 16)  Flomax 0.4 Mg Caps  (Tamsulosin Hcl) .... Once Daily  Allergies (verified): 1)  ! Adhesive Tape  Social History: Divorced  occupation--retired truck driver Tobacco Use - Yes.  Alcohol Use - no  started age 77. 1-2 1/2 ppd.  Quit smoking Nov 2011.  Patient is a Former smoker.   Review of Systems       The patient complains of shortness of breath with activity, non-productive cough, loss of appetite, weight change, abdominal pain, nasal congestion/difficulty breathing through nose, sneezing, and itching.  The patient denies shortness of breath at rest, productive cough, coughing up blood, chest pain, irregular heartbeats, acid heartburn, indigestion, difficulty swallowing, sore throat, tooth/dental problems, headaches, ear ache, anxiety, depression, hand/feet swelling, joint stiffness or pain, rash, change in color of mucus, and fever.    Vital Signs:  Patient profile:   77 year old male Height:      67 inches Weight:      201.13 pounds BMI:     31.62 O2 Sat:      91 % on 4 LPM pulsed Temp:     97.5 degrees F oral Pulse rate:   93 / minute BP sitting:   126 / 68  (left arm) Cuff size:   regular  Vitals Entered By: Arman Filter LPN (February 18, 2010 9:29 AM)  O2 Flow:  4 LPM pulsed CC: 3 month f/u appt Emphysema.  Pt states he has quit smoking.  Pt c/o sob with exertion and a  non-productive cough.  Pt states he is going to [employer].  Comments Medications reviewed with patient Arman Filter LPN  February 18, 2010 9:29 AM    Physical Exam  General:  ow male in nad Nose:  no purulence or discharge noted.  Lungs:  very decreased bs throughout, no wheezing or rhonchi Heart:  sounds regular, but distant Extremities:  no significant edema noted no cyanosis  Neurologic:  alert, oriented, moves all 4.   Impression & Recommendations:  Problem # 1:  EMPHYSEMA (ICD-492.8) the pt is doing well on his current regimen.  He has not had a recent acute exacerbation or pulmonary infection by his  history.  He has quit smoking and is participating in pulmonary rehab.  I have asked him to continue with this, and to work on weight loss  Problem # 2:  CHRONIC RESPIRATORY FAILURE (ICD-518.83) due to his emphysema and known CM.  The pt is not weighing daily, and I have stressed to him the importance of this, with feedback to his cardiologist if it is increasing.  We need to work on optimizing his oxygen therapy.  He clearly needs 4 lpm while sleeping based on his ONO, and will probably do better with continuous oxygen while ambulating.  Will change his helios over to the marathon, and get advanced to test his levels with various activities.  Medications Added to Medication List This Visit: 1)  Oxygen  2)  Flonase 50 Mcg/act Susp (Fluticasone propionate) .Marland Kitchen.. 1 spray each nostrile once daily as needed  Other Orders: Est. Patient Level III ([address]) DME Referral (DME)  Patient Instructions: 1)  congratulations on quitting smoking and attending rehab!! 2)  will have advanced change your portable oxygen to the "marathon", and check your oxygen levels at various liter flows to get you adjusted. 3)  no change in medications 4)  remember to weight yourself daily, and to let you cardiologist know if your weight is going up. 5)  followup with me in 4mos.

## 2010-03-12 ENCOUNTER — Encounter (HOSPITAL_COMMUNITY): Payer: Medicare Other

## 2010-03-13 NOTE — Progress Notes (Signed)
Summary: out of albuterol>rx sent to [facility]  Phone Note Call from Patient Call back at [facility] Phone [phone]   Caller: Patient Call For: Ashanty Coltrane Summary of Call: pt needs rx for albuterol- he is out. adv home care.  Initial call taken by: Tivis Ringer, CNA,  March 04, 2010 10:26 AM  Follow-up for Phone Call        The pt receives his albuterol neb solution through [facility] and a new RX needs to be faxed to them. The pt is out of this medication. RX printed and given to Aundra Millet so KC may sign.Michel Bickers [facility]  March 04, 2010 2:34 PM  Additional Follow-up for Phone Call Additional follow up Details #1::        signed and put in triage box. Additional Follow-up by: Barbaraann Share MD,  March 04, 2010 4:11 PM    Additional Follow-up for Phone Call Additional follow up Details #2::    rx faxed to [facility]. pt aware.Carron Curie CMA  March 04, 2010 4:17 PM   Prescriptions: ALBUTEROL SULFATE (2.5 MG/3ML) 0.083% NEBU (ALBUTEROL SULFATE) two times a day  #60 vials x 6   Entered by:   Michel Bickers CMA   Authorized by:   Barbaraann Share MD   Signed by:   Michel Bickers CMA on 03/04/2010   Method used:   Print then Give to Patient   RxID:   [id number]

## 2010-03-14 ENCOUNTER — Encounter (HOSPITAL_COMMUNITY): Payer: Medicare Other

## 2010-03-19 ENCOUNTER — Encounter (HOSPITAL_COMMUNITY): Payer: Medicare Other

## 2010-03-21 ENCOUNTER — Encounter (HOSPITAL_COMMUNITY): Payer: Medicare Other | Attending: Pulmonary Disease

## 2010-03-21 DIAGNOSIS — I5022 Chronic systolic (congestive) heart failure: Secondary | ICD-10-CM | POA: Insufficient documentation

## 2010-03-21 DIAGNOSIS — E785 Hyperlipidemia, unspecified: Secondary | ICD-10-CM | POA: Insufficient documentation

## 2010-03-21 DIAGNOSIS — Z95810 Presence of automatic (implantable) cardiac defibrillator: Secondary | ICD-10-CM | POA: Insufficient documentation

## 2010-03-21 DIAGNOSIS — I429 Cardiomyopathy, unspecified: Secondary | ICD-10-CM | POA: Insufficient documentation

## 2010-03-21 DIAGNOSIS — J4489 Other specified chronic obstructive pulmonary disease: Secondary | ICD-10-CM | POA: Insufficient documentation

## 2010-03-21 DIAGNOSIS — Z8679 Personal history of other diseases of the circulatory system: Secondary | ICD-10-CM | POA: Insufficient documentation

## 2010-03-21 DIAGNOSIS — J449 Chronic obstructive pulmonary disease, unspecified: Secondary | ICD-10-CM | POA: Insufficient documentation

## 2010-03-21 DIAGNOSIS — I252 Old myocardial infarction: Secondary | ICD-10-CM | POA: Insufficient documentation

## 2010-03-21 DIAGNOSIS — J9610 Chronic respiratory failure, unspecified whether with hypoxia or hypercapnia: Secondary | ICD-10-CM | POA: Insufficient documentation

## 2010-03-21 DIAGNOSIS — J438 Other emphysema: Secondary | ICD-10-CM | POA: Insufficient documentation

## 2010-03-21 DIAGNOSIS — Z5189 Encounter for other specified aftercare: Secondary | ICD-10-CM | POA: Insufficient documentation

## 2010-03-26 ENCOUNTER — Encounter (HOSPITAL_COMMUNITY): Payer: Medicare Other

## 2010-03-28 ENCOUNTER — Encounter (HOSPITAL_COMMUNITY): Payer: Medicare Other

## 2010-04-01 LAB — GLUCOSE, CAPILLARY
Glucose-Capillary: 159 mg/dL — ABNORMAL HIGH (ref 70–99)
Glucose-Capillary: 103 mg/dL — ABNORMAL HIGH (ref 70–99)
Glucose-Capillary: 145 mg/dL — ABNORMAL HIGH (ref 70–99)

## 2010-04-02 ENCOUNTER — Encounter (HOSPITAL_COMMUNITY): Payer: Medicare Other

## 2010-04-04 ENCOUNTER — Encounter (HOSPITAL_COMMUNITY): Payer: Medicare Other

## 2010-04-09 ENCOUNTER — Encounter (HOSPITAL_COMMUNITY): Payer: Medicare Other

## 2010-04-10 LAB — PREPARE RBC (CROSSMATCH)

## 2010-04-10 LAB — COMPREHENSIVE METABOLIC PANEL
ALT: 22 U/L (ref 0–53)
AST: 22 U/L (ref 0–37)
Albumin: 3.3 g/dL — ABNORMAL LOW (ref 3.5–5.2)
Alkaline Phosphatase: 49 U/L (ref 39–117)
BUN: 37 mg/dL — ABNORMAL HIGH (ref 6–23)
CO2: 26 mEq/L (ref 19–32)
Calcium: 8.2 mg/dL — ABNORMAL LOW (ref 8.4–10.5)
Chloride: 104 mEq/L (ref 96–112)
Creatinine, Ser: 1.45 mg/dL (ref 0.4–1.5)
GFR calc Af Amer: 57 mL/min — ABNORMAL LOW (ref >60)
GFR calc non Af Amer: 47 mL/min — ABNORMAL LOW (ref >60)
Glucose, Bld: 157 mg/dL — ABNORMAL HIGH (ref 70–99)
Potassium: 4.8 mEq/L (ref 3.5–5.1)
Sodium: 135 mEq/L (ref 135–145)
Total Bilirubin: 0.3 mg/dL (ref 0.3–1.2)
Total Protein: 6.3 g/dL (ref 6.0–8.3)

## 2010-04-10 LAB — URINALYSIS, MICROSCOPIC ONLY
Bilirubin Urine: NEGATIVE
Glucose, UA: NEGATIVE mg/dL
Ketones, ur: NEGATIVE mg/dL
pH: 7.5 (ref 5.0–8.0)

## 2010-04-10 LAB — CROSSMATCH

## 2010-04-10 LAB — DIFFERENTIAL
Basophils Absolute: 0.1 10*3/uL (ref 0.0–0.1)
Basophils Relative: 0 % (ref 0–1)
Eosinophils Absolute: 0.3 10*3/uL (ref 0.0–0.7)
Eosinophils Relative: 2 % (ref 0–5)
Lymphocytes Relative: 10 % — ABNORMAL LOW (ref 12–46)
Lymphs Abs: 1.3 10*3/uL (ref 0.7–4.0)
Monocytes Absolute: 1 10*3/uL (ref 0.1–1.0)
Monocytes Relative: 7 % (ref 3–12)
Neutro Abs: 11.1 10*3/uL — ABNORMAL HIGH (ref 1.7–7.7)
Neutrophils Relative %: 81 % — ABNORMAL HIGH (ref 43–77)

## 2010-04-10 LAB — CBC
HCT: 19.4 % — ABNORMAL LOW (ref 39.0–52.0)
HCT: 21.9 % — ABNORMAL LOW (ref 39.0–52.0)
HCT: 27.7 % — ABNORMAL LOW (ref 39.0–52.0)
Hemoglobin: 7 g/dL — ABNORMAL LOW (ref 13.0–17.0)
Hemoglobin: 9.4 g/dL — ABNORMAL LOW (ref 13.0–17.0)
MCHC: 32.1 g/dL (ref 30.0–36.0)
MCHC: 33.3 g/dL (ref 30.0–36.0)
MCV: 76 fL — ABNORMAL LOW (ref 78.0–100.0)
MCV: 78.4 fL (ref 78.0–100.0)
Platelets: 158 10*3/uL (ref 150–400)
Platelets: 242 10*3/uL (ref 150–400)
RBC: 2.48 MIL/uL — ABNORMAL LOW (ref 4.22–5.81)
RBC: 2.88 MIL/uL — ABNORMAL LOW (ref 4.22–5.81)
RBC: 3.29 MIL/uL — ABNORMAL LOW (ref 4.22–5.81)
RDW: 20.1 % — ABNORMAL HIGH (ref 11.5–15.5)
WBC: 10.4 10*3/uL (ref 4.0–10.5)
WBC: 13.7 10*3/uL — ABNORMAL HIGH (ref 4.0–10.5)
WBC: 7.5 10*3/uL (ref 4.0–10.5)

## 2010-04-10 LAB — GLUCOSE, CAPILLARY
Glucose-Capillary: 101 mg/dL — ABNORMAL HIGH (ref 70–99)
Glucose-Capillary: 111 mg/dL — ABNORMAL HIGH (ref 70–99)
Glucose-Capillary: 131 mg/dL — ABNORMAL HIGH (ref 70–99)
Glucose-Capillary: 140 mg/dL — ABNORMAL HIGH (ref 70–99)
Glucose-Capillary: 153 mg/dL — ABNORMAL HIGH (ref 70–99)

## 2010-04-10 LAB — URINE CULTURE

## 2010-04-10 LAB — PROTIME-INR
INR: 8.68 (ref 0.00–1.49)
Prothrombin Time: 70.9 seconds — ABNORMAL HIGH (ref 11.6–15.2)

## 2010-04-10 LAB — HEMOGLOBIN AND HEMATOCRIT, BLOOD: Hemoglobin: 9 g/dL — ABNORMAL LOW (ref 13.0–17.0)

## 2010-04-10 LAB — BASIC METABOLIC PANEL
BUN: 29 mg/dL — ABNORMAL HIGH (ref 6–23)
CO2: 27 mEq/L (ref 19–32)
Calcium: 8.7 mg/dL (ref 8.4–10.5)
Chloride: 106 mEq/L (ref 96–112)
Creatinine, Ser: 1.37 mg/dL (ref 0.4–1.5)
GFR calc Af Amer: >60 mL/min (ref >60)
GFR calc Af Amer: >60 mL/min (ref >60)
GFR calc non Af Amer: 50 mL/min — ABNORMAL LOW (ref >60)
Potassium: 4.3 mEq/L (ref 3.5–5.1)
Sodium: 135 mEq/L (ref 135–145)

## 2010-04-10 LAB — POCT I-STAT, CHEM 8
Chloride: 107 mEq/L (ref 96–112)
Creatinine, Ser: 1.6 mg/dL — ABNORMAL HIGH (ref 0.4–1.5)
Glucose, Bld: 154 mg/dL — ABNORMAL HIGH (ref 70–99)
Hemoglobin: 7.8 g/dL — ABNORMAL LOW (ref 13.0–17.0)
Potassium: 4.8 mEq/L (ref 3.5–5.1)

## 2010-04-10 LAB — PREPARE FRESH FROZEN PLASMA

## 2010-04-10 LAB — HEMOGLOBIN A1C: Hgb A1c MFr Bld: 7.2 % — ABNORMAL HIGH (ref 4.6–6.1)

## 2010-04-10 LAB — LIPASE, BLOOD: Lipase: 26 U/L (ref 11–59)

## 2010-04-10 LAB — POCT CARDIAC MARKERS
CKMB, poc: <1 ng/mL — ABNORMAL LOW (ref 1.0–8.0)
Troponin i, poc: <0.05 ng/mL (ref 0.00–0.09)

## 2010-04-10 LAB — TSH: TSH: 0.134 u[IU]/mL — ABNORMAL LOW (ref 0.350–4.500)

## 2010-04-10 LAB — HEMOCCULT GUIAC POC 1CARD (OFFICE): Fecal Occult Bld: POSITIVE

## 2010-04-11 ENCOUNTER — Ambulatory Visit (INDEPENDENT_AMBULATORY_CARE_PROVIDER_SITE_OTHER): Payer: Medicare Other | Admitting: *Deleted

## 2010-04-11 ENCOUNTER — Encounter (HOSPITAL_COMMUNITY): Payer: Medicare Other

## 2010-04-11 DIAGNOSIS — I428 Other cardiomyopathies: Secondary | ICD-10-CM

## 2010-04-11 DIAGNOSIS — I429 Cardiomyopathy, unspecified: Secondary | ICD-10-CM

## 2010-04-12 ENCOUNTER — Other Ambulatory Visit: Payer: Self-pay

## 2010-04-16 ENCOUNTER — Encounter (HOSPITAL_COMMUNITY): Payer: Medicare Other

## 2010-04-17 NOTE — Progress Notes (Signed)
Remote ICD check  

## 2010-04-18 ENCOUNTER — Encounter (HOSPITAL_COMMUNITY): Payer: Medicare Other

## 2010-04-21 ENCOUNTER — Encounter: Payer: Self-pay | Admitting: *Deleted

## 2010-04-23 ENCOUNTER — Encounter (HOSPITAL_COMMUNITY): Payer: Medicare Other | Attending: Pulmonary Disease

## 2010-04-23 DIAGNOSIS — I429 Cardiomyopathy, unspecified: Secondary | ICD-10-CM | POA: Insufficient documentation

## 2010-04-23 DIAGNOSIS — Z5189 Encounter for other specified aftercare: Secondary | ICD-10-CM | POA: Insufficient documentation

## 2010-04-23 DIAGNOSIS — J4489 Other specified chronic obstructive pulmonary disease: Secondary | ICD-10-CM | POA: Insufficient documentation

## 2010-04-23 DIAGNOSIS — Z95810 Presence of automatic (implantable) cardiac defibrillator: Secondary | ICD-10-CM | POA: Insufficient documentation

## 2010-04-23 DIAGNOSIS — J9610 Chronic respiratory failure, unspecified whether with hypoxia or hypercapnia: Secondary | ICD-10-CM | POA: Insufficient documentation

## 2010-04-23 DIAGNOSIS — J438 Other emphysema: Secondary | ICD-10-CM | POA: Insufficient documentation

## 2010-04-23 DIAGNOSIS — Z8679 Personal history of other diseases of the circulatory system: Secondary | ICD-10-CM | POA: Insufficient documentation

## 2010-04-23 DIAGNOSIS — E785 Hyperlipidemia, unspecified: Secondary | ICD-10-CM | POA: Insufficient documentation

## 2010-04-23 DIAGNOSIS — I252 Old myocardial infarction: Secondary | ICD-10-CM | POA: Insufficient documentation

## 2010-04-23 DIAGNOSIS — I5022 Chronic systolic (congestive) heart failure: Secondary | ICD-10-CM | POA: Insufficient documentation

## 2010-04-23 DIAGNOSIS — J449 Chronic obstructive pulmonary disease, unspecified: Secondary | ICD-10-CM | POA: Insufficient documentation

## 2010-04-25 ENCOUNTER — Encounter (HOSPITAL_COMMUNITY): Payer: Medicare Other

## 2010-04-30 ENCOUNTER — Encounter (HOSPITAL_COMMUNITY): Payer: Medicare Other

## 2010-05-02 ENCOUNTER — Encounter (HOSPITAL_COMMUNITY): Payer: Medicare Other

## 2010-05-07 ENCOUNTER — Encounter (HOSPITAL_COMMUNITY): Payer: Medicare Other

## 2010-05-09 ENCOUNTER — Encounter (HOSPITAL_COMMUNITY): Payer: Medicare Other

## 2010-05-14 ENCOUNTER — Encounter (HOSPITAL_COMMUNITY): Payer: Medicare Other

## 2010-05-16 ENCOUNTER — Encounter (HOSPITAL_COMMUNITY): Payer: Medicare Other

## 2010-05-21 ENCOUNTER — Encounter (HOSPITAL_COMMUNITY): Payer: Medicare Other

## 2010-05-23 ENCOUNTER — Encounter (HOSPITAL_COMMUNITY): Payer: Medicare Other

## 2010-05-28 ENCOUNTER — Encounter (HOSPITAL_COMMUNITY): Payer: Medicare Other

## 2010-05-30 ENCOUNTER — Encounter (HOSPITAL_COMMUNITY): Payer: Medicare Other

## 2010-06-04 ENCOUNTER — Encounter (HOSPITAL_COMMUNITY): Payer: Medicare Other

## 2010-06-04 NOTE — Letter (Signed)
October 20, 2006    Lyn Records, M.D.  [address]  [address]  [place] [address]   RE:  Robert, SINNING  MRN:  [id number]  /  DOB:  07/31/1929   Dear Erskine Emery,   Robert Davies is seen following CRT implantation for congestive heart  failure in the setting of nonischemic heart disease.  He does pretty  well from a breathing point of view, but he is extremely limited because  of back arthritis which sounds like spinal stenosis.  He is unable to  stand and walk for more than a couple of minutes at a time without  bending over or hyperextending his back.   He has also put on significant weight, probably upwards of 30 pounds in  the last couple of years since stopping smoking.   His current medications include Advair, glimepiride for his new  diagnosis of diabetes, Coumadin, furosemide, Toprol 50, aspirin 81,  enalapril 10, and Zocor.   EXAMINATION:  His blood pressure is 122/60, and his pulse is 76.  His  weight was 220 about 10% in the last 18 months.  LUNGS:  Clear.  His heart sounds were regular.  ABDOMEN:  Protuberant but soft.  EXTREMITIES:  Trace to 1+ edema.   Interrogation of his Guidant Contac H177 ICD demonstrates a P wave of 6  with impedance of 397, a threshold of 0.6. at 0.5.  The R wave was 8.8  with impedance of 417 and threshold of 0.8 at 0.5.  The L wave was 25  with impedance of 787, threshold of 1.2 at 0.5 variable to his 2.5.  A  heart rate excursion was adequate.   IMPRESSION:  1. Nonischemic cardiomyopathy.  2. Congestive heart failure - class III now class II.  3. Status post CRTD for the above.  4. Significant obesity.  5. Major limitations in exercise tolerance because of back problems.  6. New diagnosis of diabetes.   Robert Davies, Robert Davies, is doing okay from an arrhythmia point of view.  I  have tried to encourage him to work on weight loss.  At his residential  facility, there is a bike, and he thinks he may be able to exercise on a  bike, as he is able to sit.  I have encouraged him to start at 5 minutes  2-3 times a day and then try to add 5 minutes to that weekly.   He will follow up with you as previously scheduled.  We will monitor his  device via Latitude, and I will see him again in 1 year's time.    Sincerely,      Duke Salvia, MD, [facility]  Electronically Signed    SCK/MedQ  DD: 10/20/2006  DT: 10/20/2006  Job #: [id number]   CC:    Georgann Housekeeper, MD

## 2010-06-04 NOTE — Assessment & Plan Note (Signed)
[facility]                         ELECTROPHYSIOLOGY OFFICE NOTE   CLEMONS, SALVUCCI                     MRN:          [id number]  DATE:10/26/2007                            DOB:          07/31/1929    [name] is seen in followup for congestive heart failure in the  setting of nonischemic myopathy and congestive heart failure.  He also  has oxygen-dependent COPD.   He underwent device generator replacement in July.  He saw Dr. Ladona Ridgel in  August and me shortly thereafter for a hematoma, which seems now largely  to have resolved.  His breathing is better.   MEDICATIONS:  His medication list currently includes;  1. Zocor.  2. Enalapril.  3. Potassium.  4. Furosemide 40.  5. Toprol.  6. Coumadin.  7. Atenolol b.i.d.  8. Glimepiride 2 b.i.d.  9. Meloxicam.  10.Spiriva.   PHYSICAL EXAMINATION:  VITAL SIGNS:  His blood pressure is 117/70 with a  pulse of 58, weight is 216, which is stable.  LUNGS:  Clear.  NECK:  Surprisingly, his neck veins were 7-8 cm.  HEART:  Sounds were regular with 2/6 murmur heard at the apex.  EXTREMITIES:  Without edema.   Interrogation of St. Jude ICD demonstrates a P-wave of 5 with impedance  of 440, a threshold of 0.5 at 0.5, the R-wave was 11.6 with impedance of  450, a threshold of 0.75 at 0.5 and the LV impedance was 780 with  threshold of 1 volt at 0.5.  QuickOpt was done without any recommended  reprogrammings.   IMPRESSION:  1. Nonischemic cardiomyopathy.  2. Chronic systolic heart failure.  3. Chronic obstructive pulmonary disease - oxygen dependent.  4. Previously implanted CRT-D   Mr. Colavito is doing pretty well from a congestive heart failure point  of view.  He appears to be euvolemic.  He is to follow up with Dr. Katrinka Blazing  as previously scheduled.   I have also taken the liberty of scheduling him to come back and see Dr.  Shan Levans, whom he has not seen in the last 5 years, for  further  assistance with his oxygen-dependent COPD.   I will see him again as scheduled.     Duke Salvia, MD, [facility]  Electronically Signed    SCK/MedQ  DD: 10/26/2007  DT: 10/27/2007  Job #: [id number]   cc:   Lyn Records, M.D.

## 2010-06-04 NOTE — Assessment & Plan Note (Signed)
[facility]                         ELECTROPHYSIOLOGY OFFICE NOTE   DELTA, DESHMUKH                     MRN:          [id number]  DATE:08/24/2007                            DOB:          07/31/1929    HISTORY OF PRESENT ILLNESS:  [name] returns today for followup.  He  is a very pleasant male with a history of ischemic cardiomyopathy and  congestive heart failure, status post BiV ICD insertion.  His ICD was  changed out 2 weeks ago, and the patient was initially stable but has  developed pocket hematoma.  He has had no drainage, no fevers, and no  chills.  No particular tenderness.  The patient is here today because of  increased pocket swelling.  He has recently started back on his Coumadin  therapy.  He has had no fevers, chills, or other feelings of fatigue or  malaise.   MEDICATIONS:  Include:  1. Zocor 20 a day.  2. Enalapril 10 a day.  3. Aspirin 81 a day.  4. Potassium 10 twice daily  5. Toprol 50 a day.  6. Furosemide 40 a day.  7. Coumadin as directed.  8. Glyburide 2 twice a day.   PHYSICAL EXAMINATION:  GENERAL:  On exam, he is a pleasant well-  appearing man in no distress.  VITAL SIGNS:  Blood pressure is 95/62, the pulse 83 and regular,  respirations are 18, and weight is 217 pounds.  NECK:  Revealed no jugular venous distention.  LUNGS:  Clear bilaterally to auscultation.  CARDIOVASCULAR:  Reveals a regular rate and rhythm.  Normal S1 and S2.  ICD insertion site revealed a well-healing incision.  There is a  moderate-sized hematoma, which was not particularly tender with no  rounding erythema or drainage noted.  EXTREMITIES:  Demonstrated no edema.  He is well appearing.   IMPRESSION:  1. Ischemic cardiomyopathy.  2. Status post biventricular implantable cardioverter defibrillator      insertion.  3. Status post recent implantable cardioverter defibrillator generator      change with a pocket hematoma now  present.   DISCUSSION:  [name] is presently stable.  His defibrillator pocket  does have a hematoma, but there is no obvious evidence of infection this  time.  I have asked that the patient take half of  his usual dose of Coumadin and will have him see Dr. Graciela Husbands back in the  office in followup.  His INR today in the office was 1.0 despite having  recently started back on Coumadin.     Doylene Canning. Ladona Ridgel, MD  Electronically Signed    GWT/MedQ  DD: 08/24/2007  DT: 08/25/2007  Job #: [id number]

## 2010-06-04 NOTE — Discharge Summary (Signed)
NAME:  Robert Davies, Robert Davies NO.:  [id number]   MEDICAL RECORD NO.:  [id number]          PATIENT TYPE:  INP   LOCATION:  2899                         FACILITY:  [facility]   PHYSICIAN:  Duke Salvia, MD, FACCDATE OF BIRTH:  07/31/1929   DATE OF ADMISSION:  08/02/2007  DATE OF DISCHARGE:  08/02/2007                               DISCHARGE SUMMARY   PRIMARY CARE PHYSICIAN:  Georgann Housekeeper, MD   CARDIOLOGIST:  Duke Salvia, MD, [facility],  [facility].   FINAL DIAGNOSES:  1. Discharging the day of implantable cardioverter-defibrillator      generator change.      a.     Guidant model H177 explanted.      b.     St. Jude Promote RF dual-chamber cardioverter-defibrillator       implanted, Dr. Sherryl Manges.  2. His current cardioverter-defibrillator was ERI.   SECONDARY DIAGNOSES:  1. New York Heart Association Class III congestive heart failure.  2. Non-ischemic cardiomyopathy, ejection fraction 10-15% at left heart      catheterization, November 2005.  3. Large inferior scar.  4. Chronic obstructive pulmonary disease, home oxygen dependent.  5. Diabetes.  6. Hypertension.  7. Left bundle-branch block.  8. Obstructive sleep apnea.  9. Guidant Contak Renewal CRT-D implant, December 2005.  10.Glaucoma.   ALLERGIES:  This patient has allergy, but only to ADHESIVE TAPE.  No  known drug allergies.   PROCEDURES:  On August 02, 2007, explantation of existing Guidant [employer] cardioverter-defibrillator with left ventricular cardiac  resynchronization lead and implant of the St. Jude Promote RF CRT-D  system, Dr. Sherryl Manges.   HISTORY:  Robert Davies is a 77 year old man.  His defibrillator with left  cardiac resynchronization therapy is a ERI.  He has problems with  progressive congestive heart failure.  He has increasing dyspnea on  exertion.  He has peripheral edema and possibly nocturnal dyspnea.  He  has not been having chest pain.  At office  visit on June 29, 2007, the  patient's Lasix was increased from 40 mg daily to 40 mg b.i.d.  He is  scheduled for an elective outpatient procedure at [facility] for device  replacement.   HOSPITAL COURSE:  The patient presents electively on August 02, 2007.  He  has been chronically short of breath.  This is not an acute attack on  his presentation today.  He is in deed oxygen dependent and currently  uses 3 liters of nasal cannula.  He had a very strange device generator  change without any complications, discharging the day of the procedure.  He is asked to remove his bandage on the morning of Tuesday,  August 03, 2007, and leave the incision open to the air.  He is to keep the  incision dry for the next 7 days and to sponge bathe until Monday, August 09, 2007.   MEDICATIONS AT DISCHARGE:  1. Zocor 20 mg daily at bedtime.  2. Furosemide 40 mg daily.  3. Enteric-coated aspirin 81 mg daily.  4. Coumadin 5 mg daily.  5. Glimepiride 2 mg in the morning and 2 mg in the evening.  6. Enalapril 10 mg daily.  7. Metoprolol succinate 50 mg daily.  8. Dorzolamide (Cosopt) to both eyes twice daily.  9. Triamcinolone 0.1 mg twice daily.  10.Meloxicam 7.5 mg daily.  11.Advair 50/250 as needed.   He is following up with the [facility], [place], [address] on Wednesday, August 18, 2007 at 9:40.   LABORATORY STUDIES:  Pertinent to this admission were drawn on July 26, 2007.  White cells 7.3, hemoglobin 11.3, hematocrit 34.1, and platelets  are 225.  Protime 18.7 and INR 1.7.  Sodium 143, potassium 4.1, chloride  104, carbonate is 33, glucose 204, BUN is 15, and creatinine 1.3.   TIME FOR THIS DICTATION AND EXAM:  Greater than 25 minutes.      Maple Mirza, [place]      Duke Salvia, MD, [facility]  Electronically Signed    GM/MEDQ  D:  08/02/2007  T:  08/02/2007  Job:  [id number]   cc:   Georgann Housekeeper, MD  Lyn Records, M.D.  Duke Salvia, MD, [facility]

## 2010-06-04 NOTE — Op Note (Signed)
NAME:  Robert Davies, COWDREY NO.:  [id number]   MEDICAL RECORD NO.:  [id number]          PATIENT TYPE:  AMB   LOCATION:  ENDO                         FACILITY:  [facility]   PHYSICIAN:  John C. Madilyn Fireman, M.D.    DATE OF BIRTH:  07/31/1929   DATE OF PROCEDURE:  11/02/2007  DATE OF DISCHARGE:                               OPERATIVE REPORT   INDICATIONS FOR PROCEDURE:  History of adenomatous colon polyps.   PROCEDURE:  The patient was placed in the left lateral decubitus  position and placed on the pulse monitor with continuous low-flow oxygen  delivered by nasal cannula.  He was sedated with 55 mcg IV fentanyl and  4 mg IV Versed.  The Olympus video colonoscope was inserted into the  rectum and advanced as far as possible.  There was a lot of resistance  and looping and despite multiple position changes and abdominal pressure  with scope inserted its entire distance, I could not reach the cecum.  The point of furthest insertion was not certain but it was felt to be  somewhere probably in the proximal transverse colon.  The prep was good.  The most proximal portions of the visualized colon appeared normal as  did the descending, sigmoid and rectum with no masses, polyps,  diverticula or other mucosal abnormalities.  The rectum likewise  appeared normal.  Retroflexed view of the anus revealed no obvious  internal hemorrhoids.  The scope was then withdrawn and the patient  returned to the recovery room in stable condition.  He tolerated the  procedure well and there were no immediate complications.   IMPRESSION:  Normal study though incomplete to approximately the hepatic  flexure.   PLAN:  Will proceed with barium enema later today.           ______________________________  Everardo All. Madilyn Fireman, M.D.     JCH/MEDQ  D:  11/02/2007  T:  11/02/2007  Job:  [id number]   cc:   Georgann Housekeeper, MD  Fax: [phone]
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
## 2010-06-07 NOTE — Discharge Summary (Signed)
[facility]  Patient:    Robert Davies, Robert Davies Visit Number: [id number] MRN: [id number]          Service Type: MED Location: 3W 0345 01 Attending Physician:  Anastasio Auerbach Dictated by:   Anastasio Auerbach, M.D. Admit Date:  02/19/2001 Discharge Date: 03/01/2001   CC:         Duke Salvia, M.D., [facility]  Charlcie Cradle. Delford Field, M.D. [facility]   Discharge Summary  DATE OF BIRTH:  July 31, 1929  DISCHARGE DIAGNOSES:  77. Right upper lobe community-acquired pneumonia.     a. Respiratory failure.     b. Mechanical ventilation February 1 through February 5.     c. PPD negative, AFB smear negative, sputum culture normal flora.  2. Severe chronic obstructive pulmonary disease exacerbation secondary to #1.     a. No history of intubation prior to this.     b. Ongoing tobacco use.     c. Baseline PCO2 retention (54).  3. Right hilar and mediastinal adenopathy by CT.     a. Likely reactive but recommend followup in four months.  4. Steroid-induced hyperglycemia, resolved.     a. Glycohemoglobin 6.2% (upper limit of normal 6.5%).  5. Deconditioning, improved.     a. Home physical and occupational therapy ordered.  6. Constipation, resolved.  7. Benign prostatic hypertrophy.  8. Hypertension.  9. Osteoarthritis. 10. Glaucoma. 11. Questionable lactose intolerance. 12. History of herniated cervical disk.     a. Surgery in 1988. 13. No known drug allergies.  DISCHARGE MEDICATIONS: (The patient was on Combivent MDI, a blood pressure medicine, antibiotic, and eyedrops prior to admission).  1. Albuterol nebulizer 2.5 mg q.i.d.  2. Atrovent nebulizer 0.5 mg q.i.d.  3. Multivitamin daily.  4. Colace 100 mg p.o. b.i.d. to prevent constipation.  5. Pepcid 20 mg p.o. b.i.d.  6. Pulmicort nebulizer 0.062 twice a day.  7. Timolol 2 drops to the eyes b.i.d.  8. Prednisone 10 mg take 3 pills on February 11; take 2 pills x4 days, then 1     pill x4 days, then stop.  9.  Tylenol 325 one to two q.6h. p.r.n. pain.  *The patient was told to take only medications on this list.  CONDITION UPON DISCHARGE:  Stable.  Blood pressure 116/73, heart rate 71, respiratory rate 20, oxygen saturations 92% on room air, a.m. CBG 107, discharge weight 176 pounds.  DISPOSITION:  Home.  RECOMMENDED ACTIVITY:  As tolerated.  No strenuous.  Home physical and occupational therapy will come out to assess safety.  RECOMMENDED DIET:  As tolerated.  Drink plenty of water.  SPECIAL INSTRUCTIONS:  1. DO NOT SMOKE.  2. No Motrin or ibuprofen or aspirin-containing products.  They can aggravate     your stomach.  FOLLOWUP:  1. The patient has an appointment at [facility] with Dr. Duke Salvia     on Wednesday, February 12, at 10:15.  I spoke with her personally about     his condition and she will be happy to see him in followup.  2. The patient is to contact pulmonologist, Dr. Shan Levans, at [phone],     to schedule an appointment to be seen in 3-4 weeks.  He will be evaluating     pulmonary status and recommending chronic medications.  CONSULTANTS:  Dr. Mellody Dance Clance/Dr. Shan Levans, critical care medicine.  PROCEDURES:  1. Chest x-ray (January 31):  Limited due to the patients breathing motion.     Chronic obstructive pulmonary  disease.  No acute abnormality.  2. Spiral chest x-ray (January 31):  No pleural effusion.  No deep venous     thrombosis.  Right upper lobe pneumonia/air space disease.  Right hilar     and mediastinal adenopathy.  3. Chest x-ray (February 1):  Endotracheal tube in good position.     Atelectasis at the right base.  4. Chest x-ray (February 4):  No change.  5. Chest x-ray (February 6):  Stable bibasilar atelectasis.  No overt edema.     Heart size normal.  Stable.  6. Endotracheal intubation (February 1 through February 5).  HOSPITAL COURSE: #1 - RIGHT UPPER LOBE PNEUMONIA WITH RESPIRATORY FAILURE:  Mr. Mcgibbon is a 77 year old  gentleman with underlying COPD and ongoing tobacco use who presents with a one-week history of cough, fevers, and progressive shortness of breath.  His x-ray was unremarkable and, given the severity of his symptoms and an admission blood gas on room air showing a pH of 7.32, PCO2 51, and PO2 of 56, I opted to obtain a spiral chest CT to rule out pulmonary embolus, as well as look for potential occult infiltrate.  In deed, there was no evidence of PE or DVT but he did demonstrate a left lower lobe pneumonia.  This certainly accounted for the severity of his symptoms.  He was admitted to a telemetry bed with continuous pulse oximetry.  I started him on high-dose steroids as well as antibiotics, nebulizers, and Humibid.  He was moved to [facility] because there were no beds available at [facility].  On day #2 of his admission, he was symptomatically feeling better earlier in the day but subsequently decompensated.  He was intubated on February 1.  Pulmonary critical care was consulted to help with ventilator management.  He was ultimately extubated on February 5.  He stabilized and was transferred to the floor and I took over on February 8.  He completed a 10-day course of Tequin. At the time of discharge, he was stable on room air.  We will proceed with a slow prednisone taper over approximately 8-10 days.  He was instructed to stop smoking.  He will follow up with his primary care physician at [facility], Dr. Duke Salvia, and he will also follow up with Dr. Shan Levans, pulmonologist.  I suspect the adenopathy was reactive and I will let Dr. Delford Field decide about repeating CT scans as the radiologist recommended in his report.  Again, based on the symptoms, certainly seems reactive.  #2 - DECONDITIONING:  We did keep Mr. Noreen a couple extra days to work on conditioning.  He does live alone.  At the time of discharge, he was doing  much better and he will be seen at home by physical  and occupational therapy.  #3 - STEROID-INDUCED HYPERGLYCEMIA:  As we decreased his steroids, his CBGs normalized.  He did have a glycohemoglobin which was at the upper limits of normal.  His was 6.2% with the upper limits being 6.5%.  I would continue to watch him over the next several months and years for the development of diabetes.  #4 - CONSTIPATION:  We did work on this when he was in the hospital.  He will go home on Colace.  #5 - BENIGN PROSTATIC HYPERTROPHY:  PSA this admission was 3.33.  We did place him on some Flomax while he was in the hospital, as the patient had Foley catheters and did have some irritation and difficulty urinating.  At the  time of discharge, he was not on any alpha-blockers and he was urinating without difficulty.  #6 - HISTORY OF HYPERTENSION:  His blood pressures were actually quite low this admission and we did not send him home on any antihypertensives.  We will let Dr. Emeline Darling follow this up.  DISCHARGE LABORATORY DATA:  Hemoglobin 15.9, WBC 13,000 (steroid effect), platelet count 169.  Sodium 135, potassium 3.7, chloride 102, bicarb 30, BUN 21, creatinine 1.1, glucose 114.  Other pertinent labs this admission include respiratory culture normal flora, urine culture negative.  Acid-fast smear negative, final culture result pending.  I spent greater than 30 minutes arranging discharge and dictating. Dictated by:   Anastasio Auerbach, M.D. Attending Physician:  Anastasio Auerbach DD:  03/05/01 TD:  03/05/01 Job: [id number]

## 2010-06-07 NOTE — H&P (Signed)
NAME:  Robert Davies, Robert Davies NO.:  [id number]   MEDICAL RECORD NO.:  [id number]          PATIENT TYPE:  EMS   LOCATION:  MAJO                         FACILITY:  [facility]   PHYSICIAN:  Theressa Millard, M.D.    DATE OF BIRTH:  07/31/1929   DATE OF ADMISSION:  12/10/2004  DATE OF DISCHARGE:                                HISTORY & PHYSICAL   CHIEF COMPLAINT:  Robert Davies is a very nice 77 year old black male admitted  with chronic obstructive pulmonary disease exacerbation.   HISTORY OF PRESENT ILLNESS:  He has a history of oxygen-dependent chronic  obstructive pulmonary disease; however, after an AICD was placed in December  2005, oxygen was apparently stopped.  He is chronically on Spiriva one  inhaled b.i.d., Combivent very rarely, furosemide 40 mg daily, Toprol XL 50  mg daily, Zocor 20 mg daily, enalapril 10 mg daily and Coumadin 5 mg daily.   He was fine until last week when he began to feel poorly in a rather  nonspecific way.  He became very short of breath and began to cough a lot at  approximately 4 p.m. two days ago.  Over the next 30 hours he has had lots  of cough and shortness of breath.  He finally called me at approximately  midnight last night and was dragged into the emergency department.  Upon  arrival, oxygen saturation was 79% on room air with a respiratory rate of  26, with very little air movement.  He was given albuterol nebulizer and a  continuous albuterol and ipratropium nebulizer for one hour.  He was also  given Solu-Medrol 125 mg IV.  He has improved slightly and has some air  movement, but he is certainly not improved anywhere close to the point where  he could be discharged home.   He has congestive heart failure with an ejection fraction of 15% on  echocardiogram in December 2005.  His beta natriuretic peptide from this  admission was 210.   PAST MEDICAL HISTORY:  1.  Chronic obstructive pulmonary disease.  2.  Congestive heart failure.  3.  Hypertension.  4.  Dyslipidemia.  5.  Gastroesophageal reflux disease.  6.  Benign prostatic hypertrophy.   PAST SURGICAL HISTORY:  1.  Cervical diskectomy.  2.  Tonsillectomy and adenoidectomy.   ALLERGIES:  No known drug allergies.   MEDICATIONS:  See above.   SOCIAL HISTORY:  He is divorced twice.  He lives alone.  He continues to  smoke about one pack per day, but does use a nicotine patch intermittently.   FAMILY HISTORY:  The mother died of throat cancer.  A brother has had lung  cancer and his daughter has had lung cancer.   REVIEW OF SYSTEMS:  All other systems are negative.   PHYSICAL EXAMINATION:  GENERAL:  He is mildly dyspneic and coughs  intermittently.  VITAL SIGNS:  Blood pressure 108/61, pulse 91, respirations 25, oxygen  saturation 96% on 2 liters.  HEENT:  Eyes:  Pupils round, reactive to light.  Extraocular eye movements  are intact.  Ears/Nose/Throat:  Unremarkable.  NECK:  Supple.  Thyroid is not enlarged or tender.  CHEST:  Decreased breath sounds with moderate wheezing throughout.  HEART:  A normal S1 and S2 without an S3, S4 murmur, rub or click.  ABDOMEN:  Soft, nontender with normal bowel sounds without  hepatosplenomegaly or masses.  EXTREMITIES:  Without clubbing, cyanosis or edema.   A chest x-ray shows cardiomegaly with mild vascular congestion.   LABORATORY DATA:  Beta natriuretic peptide 210.  CK and troponin negative.  Creatinine 1.1, BUN 13, sodium 142, potassium 4.2, chloride 110, bicarbonate  27.  Hemoglobin 15.9, white count 12,400.   IMPRESSION:  1.  Chronic obstructive pulmonary disease exacerbation.  2.  Chronic obstructive pulmonary disease in a patient who continues to      smoke.  3.  Congestive heart failure, status post automatic implantable cardioverter      defibrillator.  4.  Hypertension.  5.  Dyslipidemia.   PLAN:  The patient will be admitted.  We will be giving him antibiotics,  steroids and  nebulizers.      Theressa Millard, M.D.  Electronically Signed     JO/MEDQ  D:  12/10/2004  T:  12/10/2004  Job:  [id number]

## 2010-06-07 NOTE — H&P (Signed)
NAME:  Robert Davies, Robert Davies                        ACCOUNT NO.:  [id number]   MEDICAL RECORD NO.:  [id number]                   PATIENT TYPE:  INP   LOCATION:  0473                                 FACILITY:  [facility]   PHYSICIAN:  Armstead Peaks, M.D.                   DATE OF BIRTH:  07/31/1929   DATE OF ADMISSION:  05/15/2002  DATE OF DISCHARGE:  05/16/2002                                HISTORY & PHYSICAL   CHIEF COMPLAINT:  Shortness of breath and chest tightness.   HISTORY OF PRESENT ILLNESS:  [age]-year-old black male with past medical  history significant for hypertension, COPD, and history of respiratory  failure in 2003.  He states he has had increasing shortness of breath over  his baseline dyspnea for the last several days with associated chest  tightness.  He presented to the emergency department on April 22nd with  similar complaints as well as a fluttering sensation in his chest.  Chest x-  ray at that time showed bronchitis.  An EKG showed normal sinus rhythm with  a left bundle branch block, not significantly different from January 2004.  The patient was released.  He states that he has had to use his nebulizer  and MDI treatments more frequently over the last couple of days.  The chest  tightness is associated with shortness of breath and lasts approximately 15  minutes.  It is not associated with exertional activity.  The shortness of  breath tends to be worst sometimes at  night when he lies down.  In the  emergency department, the patient required bronchodilator treatments, I.V.  steroids, and had an O2 saturation in the mid 80s on room air.  He is  admitted for further care.   PAST MEDICAL HISTORY:  1. COPD.  2. Hypertension.  3. Benign prostatic hypertrophy.  4. Osteoarthritis.  5. Glaucoma.  6. Pneumonia.  7. Tobacco abuse.   MEDICATIONS:  1. Combivent metered dose inhaler as needed.  2. Eye drops, (unknown dose or name).  3. Unknown blood pressure pill.   ALLERGIES:  No known drug allergies.   FAMILY HISTORY:  Family history is noncontributory.   SURGICAL HISTORY:  Cervical disk surgery.   SOCIAL HISTORY:  Single.  He smokes 2 packs per day.  No alcohol.   REVIEW OF SYSTEMS:  Per history of present illness, otherwise all of the  review of systems is negative.   PHYSICAL EXAMINATION:  VITAL SIGNS:  Blood pressure 116/75.  Pulse 88.  Respirations 28.  Saturations 94% on 3 L of nasal cannula with a low of 85%  saturations on room air.  Temperature 96.9.  GENERAL APPEARANCE:  Well-developed, well-nourished male in no acute  distress, alert and pleasant demeanor.  HEAD, EARS, EYES, NOSE AND THROAT:  Pupils are equally responsive and  reactive to light.  Nares are clear.  Oral cavity and oropharynx is  pertinent  for poor dentition.  No masses or lesions.  NECK:  Supple.  No  increased jugular venous distention.  No adenopathy or thyromegaly.  No  carotid bruit.  CARDIOVASCULAR:  Regular rate and rhythm, S1 and S2 without murmur, rub, or  gallop appreciated.  LUNGS:  Poor effort.  No wheezes appreciated.  No rales appreciated.  Fair  air movement.  ABDOMEN:  Bowel sounds are present, soft, nontender,  nondistended.  No hepatosplenomegaly or palpable masses appreciated.  RECTAL:  Deferred.  EXTREMITIES:  No clubbing, cyanosis, or edema.  Peripheral pulses 2+.  NEUROLOGIC:  Grossly intact.   LABORATORY DATA:  White blood cell count 9.5.  Hemoglobin 15.1.  Platelets  246.  Segs 71%.  Lymphocytes 17%.  Sodium 140.  Potassium 4.2.  Chloride  108.  Bicarbonate 26.  BUN 15.  Creatinine 1.2.  Glucose 120.  Calcium 9.5.  Chest x-ray shows bronchitic changes.  No acute infiltrates.   IMPRESSION:  [age]-year-old black male who was admitted with several day  history of increasing shortness of breath and chest tightness, likely  consistent with chronic obstructive pulmonary disease exacerbation.  Most  likely it is a cardiac source since he has no  history exertional chest  discomfort, although he generally has multiple risk factors of coronary  artery disease.  Upon further review of his old records, it appears that the  left bundle branch block noted on his electrocardiogram on April 22nd was  not new and is comparable to an electrocardiogram several months ago.   PLAN:  1. The patient will be admitted to a regular bed.  2. Pulmonary:  I.V. Solu-Medrol 60 mg q.6h.  Albuterol and Atrovent     nebulizers q.4h. scheduled and as needed.  Two to four liters of O2 nasal     cannula as needed.  Repeat chest x-ray in the morning.  3. Cardiovascular:  Electrocardiogram on admission.  Serial cardiac enzymes     q.8h.  4. DVT prophylaxis with subcu heparin.  5. Confirmation will be needed of his outpatient medications.                                               Armstead Peaks, M.D.    BJ/MEDQ  D:  05/15/2002  T:  05/17/2002  Job:  [id number]

## 2010-06-07 NOTE — H&P (Signed)
NAME:  Robert, Davies NO.:  [id number]   MEDICAL RECORD NO.:  [id number]                   PATIENT TYPE:  INP   LOCATION:  2011                                 FACILITY:  [facility]   PHYSICIAN:  Candyce Churn, M.D.          DATE OF BIRTH:  07/31/1929   DATE OF ADMISSION:  07/17/2003  DATE OF DISCHARGE:                                HISTORY & PHYSICAL   CHIEF COMPLAINT:  Chest pain.   HISTORY OF PRESENT ILLNESS:  Robert Davies is a pleasant, 77 year old  male with a history of:  1. Oxygen-dependent chronic obstructive pulmonary disease and asthma.  2. Hypertension.  3. Gastroesophageal reflux disease.  4. Benign prostatic hypertrophy.  5. Chronic tobacco use.   He presents with approximately 24 hours of chest pain with severe pressure  over his sternum like, three men sitting on my chest.  The pain radiated  to his left axilla and arm.  With this, he has had some shortness of breath,  but no diaphoresis and no nausea or vomiting.  The patient can usually go  off oxygen periodically, but over the last 24 hours, has had to wear it  continuously to not feel short of breath.  His chest pain is worse with  lying down apparently.  It had been apparently continuous over the last 24  hours.  He came to the emergency room and was given two sublingual  nitroglycerin with full relief of pain.  He is admitted now to rule out  unstable angina.   MEDICATIONS:  1. Recently stopped Lotrel secondary to low blood pressures.  2. Albuterol nebulizers.  3. Oxygen per nasal cannula, two liters.  4. Aspirin in the past but not taking currently.  5. Arthritis pill that has been given to him by Dr[facility] Duke Salvia at [facility] that he does not know the name of.  6. He used to take Protonix for reflux, but not currently.   OTHER MEDICATIONS:  Other medications in his regimen from note from February 27, 2003, are:  1. Clarinex.  2. Lotrel.  3. Cosopt, one  drop in each eye b.i.d.   PAST SURGICAL HISTORY:  1. Disk surgery in 1998 to his posterior neck.  2. T&A.   FAMILY HISTORY:  Cancer of the throat in his mother, and lung cancer in a  brother and daughter.   SOCIAL HISTORY:  The patient is divorced twice.  He has two daughters who  are living.  He lives alone in [place].  Habits:  He smokes one pack of tobacco daily for many years, but no alcohol  use.   REVIEW OF SYSTEMS:  He denies pleuritic chest pain or diaphoresis, denies  nausea or vomiting, denies abdominal pain.   PHYSICAL EXAMINATION:  GENERAL:  An elderly male without chest pain  currently.  VITAL SIGNS:  Some shortness of breath with lying flat.  Blood  pressure  101/63, O2 saturations 91% on two liters.  Pulse 60 and regular.  He is  afebrile.  HEENT:  Exam reveals him to be edentulous.  He has some question of mild  right facial droop, but not definite.  NECK:  Supple, no JVD.  CARDIAC:  Regular rhythm without murmur.  CHEST:  Diffusely-decreased breath sounds on auscultation, but no wheezes.  ABDOMEN:  Soft, nontender.  Bowel sounds are normal.  No obvious masses.  He  is obese.  GU:  Exam is grossly normal.  RECTAL:  Exam is not performed.  EXTREMITIES:  Trace pedal edema, but are warm distally with good capillary  refill.  NEUROLOGIC:  Reveals him to be oriented x3, nonfocal exam, questionable very  mild right facial droop, but this is difficult to distinguish.  SKIN:  Without rashes.  CHEST:  X-ray reveals cardiomegaly with vascular congestion and mild  pulmonary edema.   EKG:  Normal sinus rhythm with frequent PAC's.  Rate is approximately 80.  He has a left bundle-branch block.   White blood cell count 8900, hemoglobin 13.4, platelet count 241,000.  CK-MB  x3 in the emergency room is 1.7, 1.2 and 1.4 respectively over a three-  hours.  Sodium 139, potassium 3.7, chloride 108, bicarbonate 26, BUN 7,  creatinine 1.0.  Glucose is 84.   ASSESSMENT:  A  77 year old male with chronic obstructive pulmonary disease  which is O2 dependent with a history of gastroesophageal reflux disease with  chest pain relieved with sublingual nitroglycerin.  This pain is consistent  with either gastroesophageal reflux disease with esophageal spasm or  unstable angina.  His chest x-ray show a question of congestive heart  failure which is probable.   PLAN:  1. Event monitor.  2. Check serial cardiac enzymes.  3. Treat with Lovenox, nitroglycerin patch and O2.  4. Discontinue Lopressor secondary to severe chronic obstructive pulmonary     disease.  5. Resume Protonix.  6. Cardiology consultation if symptoms consistent with myocardial ischemia     instead of gastroesophageal reflux disease.  7. Check BNP and given one dose of Lasix IV, and schedule a 2-D     echocardiogram.                                                Candyce Churn, M.D.    RNG/MEDQ  D:  07/17/2003  T:  07/17/2003  Job:  [id number]   cc:   Lilla Shook, M.D.  [address], [address]  [place]  [place] [address]  Fax: [phone]   Shan Levans, M.D. [facility]

## 2010-06-07 NOTE — Cardiovascular Report (Signed)
NAME:  Robert Davies, Robert Davies NO.:  [id number]   MEDICAL RECORD NO.:  [id number]          PATIENT TYPE:  INP   LOCATION:  3701                         FACILITY:  [facility]   PHYSICIAN:  Lyn Records III, M.D.DATE OF BIRTH:  07/31/1929   DATE OF PROCEDURE:  DATE OF DISCHARGE:  12/02/2003                              CARDIAC CATHETERIZATION   INDICATION FOR THE PROCEDURE:  The patient was admitted on November 29, 2003  with sudden onset of chest pressure followed by shortness of breath.  Enzymes have been negative for infarction.  Previous Cardiolite study  demonstrated a possible large inferior infarction.  Echocardiogram suggests  the possibility of significantly more wall motion abnormality of the  inferobasal region than in other cardiac segments.  This study is being done  to investigate the possibility of coronary artery disease.   PROCEDURES PERFORMED:  1.  Left heart catheterization.  2  Selective coronary angiography.  1.  Left ventriculography.   DESCRIPTION:  After informed consent, a 6-French sheath was placed in the  right femoral artery using the modified Seldinger technique.  The 6-French A-  2 multipurpose catheter was then used for his complete study including  hemodynamic recordings, left ventriculography by power injection, and  selective left and right coronary angiography.  The patient tolerated the  procedure without complications, and following angiogram, a sheathogram was  performed in the right femoral demonstrating adequate sheath entry.  AngioSeal arteriotomy closure was then performed with good hemostasis.   RESULTS:  1.  Hemodynamic Data:      1.  Left ventricular pressure:  The left ventricle is 120/18.      2.  Aortic pressure:  119/65.  2.  Coronary angiography:  The patient has normal coronary arteries.      1.  Left main coronary:  Normal.      2.  Left anterior descending coronary:  The LAD is a large vessel that          gives origin  to a large trifurcating diagonal.  Minimal luminal          irregularities are noted in the diagonal and LAD vessel but no          significant obstructive lesions are seen.      3.  Circumflex artery:  Two obtuse marginal branches arise from it.  The          AV groove circumflex tapers to fine small branches in the AV groove.          No significant obstruction is noted in the circumflex territory.      4.  Right coronary:  The right coronary artery is a large vessel with a          large PDA and bifurcating left ventricular branches.  This is a          normal system.   CONCLUSIONS:  1.  Normal coronary arteries.  2.  Severe left ventricular enlargement with severe LV hypokinesis, EF in      the 10-15% range.  No mitral regurgitation is noted.  PLAN:  Aggressive heart failure therapy including beta blocker therapy,  consider BiDil therapy in this African-American gentleman.  Consider ACE  inhibitor therapy, aldosterone blockade, and will have EP evaluation for  consideration of biventricular resynchronization given the new development  of left bundle branch block on the patient's EKG during this admission.  His  QRS duration is 170 msec.       HWS/MEDQ  D:  12/01/2003  T:  12/02/2003  Job:  [id number]   cc:   Georgann Housekeeper, MD  [address]  [place]  [place] [address]  Fax: [phone]

## 2010-06-07 NOTE — Discharge Summary (Signed)
NAME:  CODI, KERTZ NO.:  [id number]   MEDICAL RECORD NO.:  [id number]          PATIENT TYPE:  INP   LOCATION:  6739                         FACILITY:  [facility]   PHYSICIAN:  Georgann Housekeeper, MD      DATE OF BIRTH:  07/31/1929   DATE OF ADMISSION:  12/10/2004  DATE OF DISCHARGE:  12/14/2004                                 DISCHARGE SUMMARY   DISCHARGE DIAGNOSIS:  1.  Chronic obstructive pulmonary disease exacerbation, bronchitis.  2.  History of congestive heart failure and coronary artery disease.   DISCHARGE MEDICATIONS:  Zocor 20 mg daily, Enalapril 2 mg daily, potassium  10 mEq 2 tablets daily, Lasix 40 mg daily, Toprol XL 50 mg daily, Coumadin 5  mg daily, aspirin 81 mg daily, Spiriva inhaler once daily, Atrovent and  Albuterol nebulizer t.i.d., prednisone taper from 60 mg down to 10 mg,  Avelox 400 mg daily for seven days, eyedrops for his glaucoma, Tussionex  cough syrup 1 tsp b.i.d. p.r.n., oxygen 2 liters nasal cannula, nicotine  patch.   DISCHARGE INSTRUCTIONS:  Follow up with Dr. Eula Listen in one week and Coumadin  clinic Monday.  He is encouraged to stop tobacco.   HOSPITAL COURSE:  The patient is a 77 year old male with a history of CHF,  COPD, tobacco use, BPH, dyslipidemia, admitted with shortness of breath and  wheezing.   Problem 1:  COPD exacerbation.  Chest x-ray did not show any infiltrate,  some mild cardiomegaly.  The patient was started on Solu-Medrol and IV  antibiotic, Rocephin, nebulizers, for treatment of bronchitis and COPD  exacerbation.  He continues to improve, as far as his steroid, was changed  to prednisone and antibiotic changed to Avelox p.o.  He had long-standing  history of tobacco use, started on nicotine patch.   Problem 2:  Coronary artery disease, CHF, EF 20%.  He ruled out for MI and  his BNP was less than 10.  Continues on current medications of Lasix and his  Coumadin.  He had AICD and he has remained in a paced  rhythm.   Problem 3:  Hypertension remained stable.   He is stable on Coumadin with INR therapeutic at 2.3 at the time of  discharge.      Georgann Housekeeper, MD  Electronically Signed     KH/MEDQ  D:  01/31/2005  T:  01/31/2005  Job:  [id number]

## 2010-06-07 NOTE — Consult Note (Signed)
NAME:  Robert, Davies NO.:  [id number]   MEDICAL RECORD NO.:  [id number]          PATIENT TYPE:  INP   LOCATION:  3701                         FACILITY:  [facility]   PHYSICIAN:  Robert Davies, M.D.   DATE OF BIRTH:  07/31/1929   DATE OF CONSULTATION:  DATE OF DISCHARGE:                                   CONSULTATION   PHYSICIANS:  Primary care physician:  Robert Housekeeper, MD  Pulmonologist:  Robert Davies, M.D.  Cardiologist:  Robert Davies, M.D.   ALLERGIES:  no known drug allergies.   PRESENTING CIRCUMSTANCE:  Consult requested to assess for ICD/CRT.   HISTORY OF PRESENT ILLNESS:  Robert Davies is a 77 year old male who  presented to [facility] on November 9 with acute onset of dyspnea  and chest pressures.  He was hospitalized back in June 2005 with  exacerbation of CHF. Ejection fraction at that time by echocardiogram was 10  to 20%.  His current chest pressure did not radiate and was not positional.  There was no nausea or vomiting, no diaphoresis.  He is also oxygen  dependent at times with concomitant COPD secondary to lifelong and ongoing  tobacco habituation, about one pack per day.  It was the need for increased  oxygen support and chest pressure which brought the patient to the hospital.  On admission, the patient was found to have new left bundle branch block on  electrocardiogram.  The electrocardiogram of June 2005 showed normal sinus  rhythm with QRS of 96 msec. QRS now is slightly greater than 166 msec.  The  patient on admission was ruled out for myocardial infarction by serial  cardiac enzymes.  He was started on IV heparin.  He has been diuresed to a  euvolemic status.   He had left heart catheterization today which showed global hypokinesis,  ejection fraction of less than 15%.  The patient states that he walks slowly  only one to two blocks before getting totally exhausted and having to rest.  The patient appears to have class  III/IV congestive failure symptoms.  Thromboembolic risk factors include hypertension, cardiomyopathy, congestive  heart failure.  He had adenosine Cardiolite study in July 2005 with ejection  fraction 21% and global hypokinesis with a fixed defect of the inferior wall  and septum.  Echocardiogram was done November 9 which shows the left  ventricle is dilated.  Ejection fraction 10 to 20%.  Diffuse left  ventricular hypokinesis.  No wall motion abnormalities.  Mild mitral  regurgitation.  No mitral vascular vegetation.  In the setting of severely  decreased left ventricular ejection fraction, wide QRS, and class III/IV  congestive heart failure symptoms, we are asked to consult.   MEDICATIONS:  1.  Enteric-coated aspirin 81 mg daily.  2.  Vasotec 10 mg daily.  3.  Toprol XL 50 mg daily.  4.  Zocor 20 mg daily at bedtime.  5.  Furosemide 20 mg daily.  6.  Combivent metered dose inhaler 2 puffs q.6h.  7.  Coumadin, dose to be determined.   SOCIAL HISTORY:  The  patient lives in [place] alone.  He continues to  smoke one pack per day, although he is enrolled in a smoking cessation  class.  He does not partake of alcoholic beverages.   FAMILY HISTORY:  Significant for mother dying of throat cancer, one brother  with lung cancer.   REVIEW OF SYSTEMS:  The patient is not declaring any fevers, chills, night  sweats, weight change, or adenopathy.  HEENT:  No epistaxis, No hoarseness,  no vertigo, no photophobia. INTEGUMENT:  No rashes, nonhealing lesions, or  ulcerations.  CARDIOPULMONARY:  The patient does have chest pain which  presents as chest tightness or heaviness.  He is short of breath more than  his baseline.  He does have dyspnea on exertion as a chronic feature.  He is  able to sleep on one pillow.  He does not have paroxysmal nocturnal dyspnea,  notes no edema.  Has had no history of syncope, palpitations.  URINARY/GENITAL:  Noncontributory.  NEUROPSYCHIATRIC:  No anxiety,   depression, or numbness. GI:  History of chronic heartburn.  ENDOCRINE:  Thyroid status currently unknown.  The patient has no prior history of  diabetes.   PHYSICAL EXAMINATION:  VITAL SIGNS:  Temperature 98.2, pulse 56 and regular,  blood pressure 102/70, respirations 20, oxygen saturation 95% on 3 liters.  GENERAL:  Alert and oriented x 3.  Speech is thick, sometimes hard to  understand.  HEENT:  Normocephalic and atraumatic.  Eyes:  Pupils equal, round, and  reactive to light.  Extraocular movements intact.  Sclerae clear.  Nares  without discharge.  NECK:  Supple.  No carotid bruits auscultated.  There was 2+ jugular venous  distention.  No cervical lymphadenopathy.  HEART:  Regular rate and rhythm with S1 and S2 auscultated, possible S4,  without murmur.  LUNGS:  Congested expiratory wheezes and distant bilaterally.  ABDOMEN:  Soft, nondistended.  Bowel sounds are present.  No  hepatosplenomegaly.  The abdominal aorta is non-pulsatile.  EXTREMITIES:  No evidence of clubbing, cyanosis, or edema.  He has 4/4  palpable dorsalis pedis pulses, 4/4 radial pulses.  MUSCULOSKELETAL:  No joint deformity or effusions.  NEUROLOGIC:  Alert and oriented x 3.  Cranial nerves II-XII grossly intact.  No focal neurologic deficit noted.   LABORATORY AND X-RAY DATA:  Chest x-ray taken November 8 shows cardiomegaly,  density in the right apex, likely vascular.  Radiology asking for followup  PA and lateral study.   Electrocardiogram July 18, 2003, rate 66, sinus rhythm, PR internal 163, QRS  96, QTC 433.  Electrocardiogram November 28, 2003, rate 87, sinus rhythm, new  left bundle branch block, PR 136, QRS 166, QTC 486.   Laboratory studies:  PT 16, INR 1.4.  Serum electrolytes:  Sodium 140,  potassium 4.7, chloride 106, carbonate 26, BUN 15, creatinine 1, glucose 85,  BNP 190 on November 9.  Complete blood count:  White cells 7.9, hemoglobin 14.1, hematocrit 41, platelets 225.  Cardiac enzymes:   Serial troponin  studies less than 0.5, then 0.01, then less than 0.01, then less than 0.01,  then less than 0.01.   IMPRESSION:  1.  Acute decompensated congestive heart failure.  2.  New left bundle branch block with QRS about 166 msec.  3.  Nonischemic cardiomyopathy with ejection fraction 10 to 20% by left      heart catheterization November 11.  4.  Chronic obstructive pulmonary disease with home oxygen which is      intermittent.  5.  Ongoing tobacco habituation.  6.  Benign prostatic hypertrophy.  7.  Hypertension.  8.  Gastroesophageal reflux disease.  9.  Osteoarthritis.   PAST SURGICAL HISTORY:  1.  Cervical disk surgery.  2.  Status post tonsillectomy and adenoidectomy.   PLAN:  The patient is seen in [facility] by Drs. Graciela Husbands and Ladona Ridgel,  both of whom are out of town.  Basically took granddaughter's and patient's  telephone numbers, discussed criteria for ICD/CRT (which seem valid here)  based on COMPANION study.  Will FAX all relevant materials to our office and  will call the patient with appointment at the earliest convenience.       GM/MEDQ  D:  12/01/2003  T:  12/01/2003  Job:  [id number]   cc:   Robert Housekeeper, MD  [address]  [place]  [place] [address]  Fax: [phone]   Robert Davies, M.D. [facility]   Robert Davies III, M.D.  [address]  [place]  [place] [address]  Fax: [phone]

## 2010-06-07 NOTE — Discharge Summary (Signed)
NAME:  Robert Davies, Robert Davies                        ACCOUNT NO.:  [id number]   MEDICAL RECORD NO.:  [id number]                   PATIENT TYPE:  INP   LOCATION:  0375                                 FACILITY:  [facility]   PHYSICIAN:  Sherin Quarry, MD                   DATE OF BIRTH:  07/31/1929   DATE OF ADMISSION:  01/31/2002  DATE OF DISCHARGE:                                 DISCHARGE SUMMARY   HISTORY OF PRESENT ILLNESS:  The patient is a 77 year old man with a long-  standing history of COPD who has been admitted on several occasions with  acute exacerbations of chronic bronchitis.  He presented to the [facility]  Emergency Room on January 13 with a five day history of cough productive of  greenish phlegm associated with fever, generalized chest congestion, and  body aches.  He was having difficulty sleeping secondary to wheezing and  breathing difficulty.  There was no associated nausea, vomiting, abdominal  pain, headache, or dysuria.  He has a history of cigarette smoking.   PHYSICAL EXAMINATION:  As described by Hortencia Pilar, M.D.  VITAL SIGNS:  Temperature 99.4, blood pressure 143/78, O2 saturation 94%,  pulse 94, respirations 20.  HEENT:  Within normal limits.  CHEST:  Diffuse rhonchi, particularly at the right base.  Mild diffuse  wheezing was noted.  CARDIOVASCULAR:  Normal S1 and S2 without rubs, murmurs, or gallops.  ABDOMEN:  Benign.  NEUROLOGIC:  Normal.  EXTREMITIES:  Normal.   LABORATORIES:  CBC revealed a white count of 8900, hemoglobin 13.9.  Glucose  144, sodium 142, potassium 3.6.  Blood cultures were obtained which were  negative.  Chest x-ray showed evidence of chronic interstitial and  peribronchial changes with active peribronchial infiltrates.  EKG showed  normal sinus rhythm with left axis deviation, nonspecific interventricular  conduction delay was noted.   On admission the patient was placed on Zithromax 500 mg IV daily and  Rocephin 1 g IV daily,  Solu-Medrol 120 mg IV q.6h. as instituted.  Nebulizer  treatments were given with albuterol and Atrovent q.4h.  The patient  experienced a mild increase in blood sugar, presumably secondary to steroid  therapy.  By January 15, although the patient did continue to have a  productive cough, wheezing and chest congestion were much improved.  It was  felt reasonable to discharge the patient at that time.  At the time of  discharge I had an extended discussion with the patient in which I told him  that I thought the key for the future in reducing the number of  hospitalizations was for him to have some type of ongoing medical care.  He  does have adequate insurance coverage.  I suggested Lilla Shook, M.D.  would be a good choice but I emphasized to him that all the doctors at [facility]  take [employer].  He indicated that he  would see Lilla Shook, M.D.  and we made an appointment for him.   DISCHARGE MEDICATIONS:  1. A tapering schedule of prednisone, i.e. 40 x4, 30 x4, 20 x4, 10 x4, then     stop.  2. Combivent metered dose inhaler three puffs q.i.d.  3. Zithromax 250 mg daily x5 days.  4. Ceftin 500 mg b.i.d. x5 days.  5. He has a nebulizer at home which he will continue to use p.r.n.   I advised him to return to see Lilla Shook, M.D. in seven days.  It  will be important at that time to reassess the patient's blood sugar which I  think is going to come down as prednisone is tapered but if this does not  occur he may require treatment.   DISCHARGE DIAGNOSES:  Acute exacerbation of chronic bronchitis in patient  with chronic obstructive pulmonary disease.   CONDITION ON DISCHARGE:  Fair.                                               Sherin Quarry, MD    SY/MEDQ  D:  02/03/2002  T:  02/03/2002  Job:  [id number]   cc:   Lilla Shook, M.D.  [address]  [place]  [place]  [address]  Fax: [phone]

## 2010-06-07 NOTE — Op Note (Signed)
NAME:  Robert Davies, Robert Davies NO.:  [id number]   MEDICAL RECORD NO.:  [id number]          PATIENT TYPE:  INP   LOCATION:  3714                         FACILITY:  [facility]   PHYSICIAN:  Duke Salvia, M.D.  DATE OF BIRTH:  07/31/1929   DATE OF PROCEDURE:  01/05/2004  DATE OF DISCHARGE:                                 OPERATIVE REPORT   PREOPERATIVE DIAGNOSES:  1.  Nonischemic cardiomyopathy.  2.  Congestive heart failure.  3.  Left bundle branch block.  4.  Chronic obstructive pulmonary disease.   POSTOPERATIVE DIAGNOSES:  1.  Nonischemic cardiomyopathy.  2.  Congestive heart failure.  3.  Left bundle branch block.  4.  Chronic obstructive pulmonary disease.   PROCEDURE:  Dual chamber defibrillator implantation with cardiac  resynchronization therapy.   Following the obtainment of informed consent, the patient was brought to the  electrophysiology laboratory and placed on the fluoroscopic table in the  supine position.  After routine prep and drape, lidocaine was infiltrated in  the prepectoral subclavicular region.  An incision was made and carried down  to the layer of the prepectoral fashion using electrocautery and sharp  dissection.  A pocket was formed similarly.  Hemostasis was obtained.   Thereafter, attention was turned to gaining access to the extrathoracic left  subclavian vein which was accomplished without difficulty without the  aspiration of air or puncture of the artery.  Three separate venipunctures  were accomplished and the guide wires were placed and retained.   Over the middle guide wire was a 9 French tearaway introducer sheath with  stay sutures.  Then passed a Guidant [id number] dual coiled defibrillator  lead.  It was moved to the right ventricular apex but was subsequently moved  to the right ventricular septum for reasons that are outlined below.  In its  final location, the bipolar R-wave was 9.6 mV with a pacing impedance of  1000  ohms with a threshold of 1.9 volts and 0.5 msec with __________  threshold of 2.6 mA.   Initially having been secured and subsequently moved as noted, a 9.5 [place]  sheath was placed over the most caudal guide wire and using an extended hook  coronary sinus cannulation catheter the coronary sinus was cannulated in a  variety of steps.  The first was the inferior medial branch which was really  quite small.  The body of the coronary sinus was then cannulated.  Contrast  venograms were obtained that demonstrated two small lateral branches, both  taking off at about 90 degrees in the midbody of the coronary sinus.  Using  a variety of techniques including initially just the guide wire then the  rapid O cannulation catheter, the larger of these two veins was cannulated  on a couple of occasions.  However, we were unable to pass the lead past the  junction of the vein with the body of the coronary sinus despite good  pushing availability, etc.  Multiple attempts were also made to identify a  branch over the anterior aspect of the heart and wires were able to be  placed onto the lateral wall, but again the tortuosity and size of the more  proximal veins fails to permit transaccess of these veins by the lead.  We  then tried to withdraw the sheath to see if there was any more proximal vein  branches.  At this point, we noted that there were two inferior branches,  one the dominant one that we had seen and one that was even more associated  with middle cardiac vein that was quite large.  It turned out to be quite  difficult to cannulate the latter and so we tried working with the former  and ultimately found a location where we could pass the wire to the lateral  wall at the junction of the third and fourth quarters in a basal-apical  orientation.  In this location the V to V-timing difference was 75-80 msec.  It was elected, thus, to move the RV lead up into the septum which was  accomplished and  the LV/RV time differential then became 110-120 msec.  In  this location the bipolar L-wave was 30 mV with a pacing impedance of 1491  ohms with a threshold of 1.8 v at 0.5 msec.  Over the last retained guide  wire a 7 French sheath was placed through which was passed a Medtronic  [id number] cm active fixation atrial lead, serial #[id number].  The bipolar P-  wave was 3.5 mV in the right atrial appendage, the impedance was 747 and  limb threshold was 0.7 v at 0.5 msec.  __________ threshold was 1.1 mA.  With these successful parameters recorded, the leads were then attached to a  Guidant RENEWAL 3 high-energy defibrillator model H177, serial [id number].  Through the device the bipolar R-wave was 6.6 with an impedance of 614 and a  threshold of .2 and .5 with an L-wave of 25, impedance of 1234, a threshold  of 2 v at .5 and an RA amplitude of 2.4 with an impedance of 610 and a  threshold of .2 and .5.  I do not have the high voltage impedance which were  recorded, but they were normal.   At this point defibrillation threshold testing was undertaken.  Ventricular  fibrillation was induced via the T-wave shock.  After a total duration of 8  sec and 26 joules, shock was delivered through a measured resistance of 37  ohms, terminating ventricular fibrillation and restoring sinus rhythm.  After a wait of 5-6 minutes, a 21 joule shock was delivered through induced  ventricular fibrillation, again restoring sinus rhythm after a total  duration of about 7 seconds.  Again, the impedance is not recorded on my  worksheet, but it was in the mid 30s again.  At this point the system was  implanted.  The pocket was copiously irrigated with antibiotic containing  saline solution.  Hemostasis was assured and the leads and the pulse  generator were placed in the pocket and secured to the prepectoral fascia.  The wound was closed in three layers in the normal fashion.  The wound was washed, dried and a Benzoin and  Steri-Strip dressing was applied.  Needle  counts, sponge counts and instrument counts were correct at the end of the  procedure according to the staff.  The patient tolerated the procedure  without apparent complication.       SCK/MEDQ  D:  01/05/2004  T:  01/07/2004  Job:  [id number]   cc:   Lesleigh Noe, M.D.  [address]  [address]  [address]  [place]  [place] [address]  Fax: [phone]   Electrophysiology Laboratory

## 2010-06-07 NOTE — Discharge Summary (Signed)
NAME:  AMAN, BONET NO.:  [id number]   MEDICAL RECORD NO.:  [id number]          PATIENT TYPE:  INP   LOCATION:  3714                         FACILITY:  [facility]   PHYSICIAN:  Duke Salvia, M.D.  DATE OF BIRTH:  07/31/1929   DATE OF ADMISSION:  01/05/2004  DATE OF DISCHARGE:  01/06/2004                                 DISCHARGE SUMMARY   DISCHARGE DIAGNOSIS:  Discharging post-procedure day #1 status post  implantation of guidance Contak space 3 renewal HD, Model H177 ICD with bi-  ventricular implant. The patient had no complications following this  procedure.   SECONDARY DIAGNOSES:  1.  Multiple hospitalizations for decompensated congestive heart failure.  2.  Class III congestive heart failure.  3.  Left heart catheterization December 01, 2003 showing normal coronary      anatomy, severe left ventricular dysfunction, ejection fraction less      than 15%, a non-ischemic cardiomyopathy.  4.  Ongoing tobacco.  5.  Left bundle branch block with QS about 166 milliseconds.  6.  Chronic obstructive pulmonary disease with intermittent home oxygen use.  7.  Benign prostatic hypertrophy.  8.  Hypertension.  9.  Gastroesophageal reflux disease.  10. Osteoarthritis.  11. Dyslipidemia.   PROCEDURE:  January 05, 2004 - Implantation of the Guidant Contak renewal  ICD with biventricular implant. The left ventricular access was limited to  the lateral apical segment. Therefore, there was subsequent repositioning of  the right ventricular lead to mid septum. Dr. Sherryl Manges,  Electrophysiologist.   DISPOSITION:  The patient ready for discharge post-procedure day 1. Incision  was looking good without erythema, ecchymosis, swelling, or drainage. The  patient is not having pain that is uncontrollable at the incision. The chest  x-ray will be inspected for lead placement appropriately and also for lead  integrity as well as pneumothorax. The bandage will be removed and  painted  with Betadine once. The patient is to keep the incision dry for the next  week. The ICD will be interrogated post-procedure day 1. The patient will  restart Coumadin 5 mg daily. The patient will have mobility of the left  upper extremity described.   DISCHARGE MEDICATIONS:  1.  Enteric coated aspirin 81 mg daily.  2.  Vasotec 10 mg daily.  3.  Toprol XL 50 mg daily.  4.  Zocor 20 mg daily at bedtime.  5.  Furosemide 40 mg daily.  6.  Combivent meter dose inhaler 2 puffs every 6 hours.  7.  Coumadin 5 mg daily except for Friday 7.5 mg.  8.  Klor-con 10 meq twice daily.  9.  Tylenol 325 mg 1 to 2 tabs every 4 to 6 hours as needed for pain      management.   ACTIVITY:  Is explained in the mobility sheet.   DIET:  Low-sodium, low-cholesterol diet.   SPECIAL INSTRUCTIONS:  He is to keep the incision dry for the next week.   FOLLOW UP:  1.  With [facility] at [address]  2.  ICD Clinic Friday, January 19, 2004 at  9:00.  3.  To see Dr. Graciela Husbands on April 14, 2004 at 3:45.   HISTORY OF PRESENT ILLNESS:  Mr. 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 He had a history of cardiomyopathy which dates back at  least some months ago. He was found in June to have an ejection fraction of  20%. Over the next 6 months, he re-presented with heart failure at least  once or twice, the most latest in early November of this year. He had a QRS  lengthening to about 166 milliseconds with left bundle branch block. He has  moderate dyspnea on exertion. He is able to walk less than 100 yards without  shortness of breath. He is not able to climb stairs. He does not have  nocturnal dyspnea or orthopnea or peripheral edema. He has had no syncope or  significant palpitations. He had chest pain on visitation in early November.  He underwent left heart catheterization, which showed an ejection fraction  of 20% but no obstructive coronary artery disease. He has a  history of  chronic obstructive pulmonary disease and had been taking oxygen  intermittently.   PAST MEDICAL HISTORY:  Is notable in addition to gastroesophageal reflux  disease, benign prostatic hypertrophy, hypertension, and dyslipidemia. The  patient meets the criteria for cardiac resynchronization with implantable  cardioverter defibrillator. The risks and benefits have been discussed with  Mr. Livsey and he wishes to proceed. Mr. Woolum will present January 05, 2004 for elective implantation.   HOSPITAL COURSE:  Mr. Santana presented January 05, 2004. A Guidant ICD  with biventricular implant was successfully placed. The patient had no  complications post procedure. He goes home with a followup and medications  as dictated.       GM/MEDQ  D:  01/05/2004  T:  01/07/2004  Job:  [id number]   cc:   Duke Salvia, M.D.   Georgann Housekeeper, MD  [address]  [place]  [place] [address]  Fax: [phone]   Lyn Records III, M.D.  [address]  [place]  [place] [address]  Fax: [phone]

## 2010-06-07 NOTE — Discharge Summary (Signed)
NAME:  Robert Davies, Robert Davies NO.:  [id number]   MEDICAL RECORD NO.:  [id number]          PATIENT TYPE:  INP   LOCATION:  3714                         FACILITY:  [facility]   PHYSICIAN:  Robert Davies, M.D.  DATE OF BIRTH:  07/31/1929   DATE OF ADMISSION:  01/05/2004  DATE OF DISCHARGE:  01/08/2004                                 DISCHARGE SUMMARY   ADDENDUM:   PRIMARY CARE PHYSICIAN:  Robert Davies, M.D.   CARDIOLOGIST:  Robert Davies, M.D.   [name] was slated for discharge on January 06, 2004, post procedure  day #1 after implantation of Guidant ICD with biventricular upgrade.  His  discharge was held for mild ecchymosis and swelling at the ICD pocket.  This  was watched carefully over the next 48 hours.  However, on January 08, 2004, the pocket seemed stable.  [name] INR had been slowly advancing  over the weekend.  On January 07, 2004, the INR was 1.1 and on January 08, 2004, 1.8.  The patient was discharged on January 08, 2004, with  instructions for Dr. Graciela Davies to hold his Coumadin until Friday, January 12, 2004, and then to restart it.  He will also apply to Dr. Michaelle Davies office to  make an appointment to get his Coumadin checked on January 19, 2004.  He  goes home on the medications and followup as dictated on January 05, 2004.       GM/MEDQ  D:  01/08/2004  T:  01/08/2004  Job:  [id number]   cc:   Robert Housekeeper, MD  [address]  [place]  [place] [address]  Fax: [phone]   Robert Davies III, M.D.  [address]  [place]  [place] [address]  Fax: [phone]

## 2010-06-07 NOTE — Consult Note (Signed)
NAME:  Robert Davies, Robert Davies NO.:  [id number]   MEDICAL RECORD NO.:  [id number]                   PATIENT TYPE:  INP   LOCATION:  2011                                 FACILITY:  [facility]   PHYSICIAN:  Lesleigh Noe, M.D.            DATE OF BIRTH:  07/31/1929   DATE OF CONSULTATION:  07/18/2003  DATE OF DISCHARGE:                                   CONSULTATION   CONCLUSIONS:  1. Significant dyspnea on admission secondary to a combination of congestive     heart failure and #2 blow.  2. Severe O2 requiring chronic obstructive pulmonary disease.  3. History of hypertension.  Hypertension medications recently stopped     (Lotrel).  4. Benign prostatic hypertrophy.  5. Continued cigarette abuse.  6. Esophageal reflux.   RECOMMENDATIONS:  1. 2-D Doppler echocardiogram to assess LV function.  2. Diuresis.  3. May need ischemic evaluation once more stable.  4. Encouraged to stop smoking.  5. Antithrombotic therapy, especially if we determine that the patient is in     CHF.  This should be continued while he is sedentary.   COMMENTS:  The patient is 77 and has a history of COPD.  He is followed by  Dr. Mayra Neer.  He was admitted to the hospital on 07/17/2003 with  shortness of breath and sharp left axillary chest pain.  Upon admission he  was found to have congestion on chest x-ray.  Cardiac markers were negative  for evidence of myocardial infarction, but the BNP was elevated consistent  with congestive heart failure.  He was admitted for further therapy and  evaluation.   ALLERGIES:  NONE KNOWN.   MEDICATIONS ON ADMISSION:  1. He used to be on Lotrel.  2. He takes Cosopt for his eyes.  3. Clarinex for seasonal allergies.  4. Albuterol nebulizer.  5. Home O2.  6. Protonix for reflux.   FAMILY HISTORY:  No history of heart disease.   REVIEW OF SYSTEMS:  Denies history of heart failure, myocardial infarction,  arrhythmias.   HABITS:  He  continues to smoke.  Denies ethanol consumption.   PHYSICAL EXAMINATION:  GENERAL:  The patient is laying comfortably in bed.  He has received some diuretic.  VITAL SIGNS:  His current blood pressure is 115/70, heart rate 70,  respiratory rate 24.  O2 saturation is around 93% on 3 L.  SKIN:  Warm and dry.  NECK:  There is mild JVD.  No carotid bruits.  LUNGS:  Decreased breath sounds and possibly faint rales at the bases  bilaterally.  Expiratory wheezes are heard.  CARDIAC:  Difficult because of wheezing.  No obvious gallop is heard.  ABDOMEN:  Soft.  No ascites.  Liver edge not palpable.  EXTREMITIES:  No edema.  NEUROLOGIC:  Unremarkable.   LABORATORY DATA:  Chest x-ray reveals increase in vascular congestion and  interstitial edema when compared to an  earlier x-ray in February.   BUN and creatinine are 7 and 1.0.  Potassium 3.7.  Cardiac enzymes negative  x2.  Hemoglobin 13.5.   DISCUSSION:  The patient recently had his blood pressure medications  discontinued.  He has no prior history of heart disease.  Chest x-ray and  BNP suggest the presence of CHF.  He needs to have diuresis to improve his  clinical condition.  2-D echocardiogram will help assess LV function.  We  may consider an ischemic evaluation depending upon LV function.                                               Lesleigh Noe, M.D.    HWS/MEDQ  D:  07/19/2003  T:  07/19/2003  Job:  [id number]   cc:   Lilla Shook, M.D.  [address], [address]  [place]  [place] [address]  Fax: [phone]

## 2010-06-07 NOTE — Discharge Summary (Signed)
NAME:  Robert Davies, Robert Davies                        ACCOUNT NO.:  [id number]   MEDICAL RECORD NO.:  [id number]                   PATIENT TYPE:  INP   LOCATION:  5725                                 FACILITY:  [facility]   PHYSICIAN:  Lilla Shook, M.D.            DATE OF BIRTH:  07/31/1929   DATE OF ADMISSION:  02/27/2003  DATE OF DISCHARGE:  03/02/2003                                 DISCHARGE SUMMARY   ADMISSION DIAGNOSES:  1. Chronic pulmonary obstructive disease exacerbation.  2. Hypertension.   DISCHARGE DIAGNOSES:  1. Chronic pulmonary obstructive disease exacerbation due to bronchitis.  2. Hypertension.   DISCHARGE MEDICATIONS:  1. Cefuroxime 500 mg b.i.d.  2. Zithromax 500 mg b.i.d. x4 days.  3. Prednisone taper in the following fashion:  40 mg daily x2 days, 30 mg     daily x2 days, 20 mg daily x2 days, 10 mg daily x 2 days, 5 mg daily x 2     days then stop.  4. Lotrel 5/10 mg daily.  5. Clarinex or Claritin daily.  6. Aspirin 325 mg daily.  7. Albuterol MDI 2 puffs q.4-6h p.r.n.  8. Tussionex 1 tsp q.h.s. for cough p.r.n.  9. Entex LA b.i.d. for cough.  10.      Use oxygen at 3 liters or more at h.s. and during the day if     needed.   DISCHARGE INSTRUCTIONS:  Robert Davies was discharged home where will follow a  low salt diet and use the above medication regimen.  He will call the office  with more coughing, shortness of breath, chest pain, etc.  He was to see me  within 4 days of discharge and actually had an appointment on March 07, 2003.   CHIEF COMPLAINT:  Cough.   HISTORY OF PRESENT ILLNESS:  Robert Davies is a 77 year old man who has  history of COPD and still actively smokes.  He had presented with a 24 hour history of a severe cough to the point that  he could not catch his breath.  He was producing clear to greenish sputum  that was associated with dyspnea on exertion and wheezing.  He continued to  smoke 1 pack per day. No fevers, chills or pain.   SIGNIFICANT EXAM:  VITAL SIGNS:  Temperature 95, blood pressure 117/71,  heart rate 96, respirations 32 with pulse oximetry 85% on room air.  LUNGS:  Were remarkable for decreased breath sounds bilaterally and  respiratory wheezes bilaterally as well.  HEART:  Regular with no murmurs, rubs, or gallops.   SIGNIFICANT DATA:  WBC:  13.7 thousand, hemoglobin 14.8, differential with  left shift.  ABG on 2.5 liters oxygen revealed pH 7.34, PCO2 51.6 and PO2  83.  Glucose 148.  Chest x-ray without infiltrate.   HOSPITAL COURSE:  Robert Davies was admitted and treated for his exacerbation  of chronic bronchitis with steroids, antibiotics and cough expectorants.  He  also received nebulizer treatments.  Initially he be discharged 2 days after  admission but he required one day longer of intravenous therapy.  He was  switched uneventfully from IV to oral therapy.  He required his usual  continuous oxygen but this was increased to 3 liters per minute instead of  2.5.  He was to go home with [facility], with [employer] supplying his  oxygen at 3 liters per minute.  He had decreased wheezes and a few rhonchi  on discharge.   DISCHARGE CONDITION:  Stable and improved.                                                Lilla Shook, M.D.    SEJ/MEDQ  D:  04/19/2003  T:  04/19/2003  Job:  [id number]

## 2010-06-07 NOTE — H&P (Signed)
NAME:  SAFWAN, TOMEI NO.:  [id number]   MEDICAL RECORD NO.:  [id number]          PATIENT TYPE:  EMS   LOCATION:  MAJO                         FACILITY:  [facility]   PHYSICIAN:  Quita Skye. Collman, MDDATE OF BIRTH:  07/31/1929   DATE OF ADMISSION:  11/28/2003  DATE OF DISCHARGE:                                HISTORY & PHYSICAL   Robert Davies is a 77 year old black man who was admitted to [facility] because of acute dyspnea.   The patient has a history of both chronic obstructive pulmonary disease and  congestive heart failure.  His chronic obstructive pulmonary disease is  oxygen dependent, and he continues to smoke cigarettes.  His congestive  heart failure was evaluated during an admission in June 2005.  At that time,  his ejection fraction was determined to be 21%.  Stress thallium  demonstrated no evidence of reversible ischemia.  Other medical problems  include hypertension, dyslipidemia, and gastroesophageal reflux.  The  patient presented to the emergency department after experiencing the acute  onset of dyspnea at approximately 7 p.m. this evening.  This was associated  with a heaviness and tightness in the center of his chest.  The chest  discomfort did not radiate.  It was not associated with nausea or  diaphoresis.  There were no exacerbating or ameliorating factors.  It  appeared not to be related to position, activity, meals, or respirations.  Upon arrival in the emergency department, he was found to be in marked  respiratory distress.  His respiratory rate was 32 with a pulse ox of 87%.  Treatment was initiated with oxygen, nitroglycerin, and albuterol.  He  reports that his dyspnea has significantly improved.  He was also treated  with medications for the hyperkalemia noted in the emergency department.   MEDICATIONS:  1.  Combivent inhaler.  2.  Furosemide 40 mg p.o. daily.  3.  Toprol XL 50 mg p.o. daily.  4.  Zocor 20 mg p.o.  daily.  5.  Enalapril 10 mg p.o. daily.  6.  Coumadin.   ALLERGIES:  None.   The patient lives alone.  He continues to smoke.  He does not drink alcohol.   PAST SURGICAL HISTORY:  1.  Cervical disk surgery.  2.  Tonsillectomy and adenoidectomy.   FAMILY HISTORY:  His mother died of cancer of the throat.  Both a brother  and daughter have lung cancer.   REVIEW OF SYSTEMS:  Reveals no new problems related to his head, eyes, ears,  nose, mouth, throat, gastrointestinal system, genitourinary system, or  extremities.  There is no history of neurologic or psychiatric disorder.  There is no history of fever, chills, or weight loss.   PHYSICAL EXAMINATION:  VITAL SIGNS:  Blood pressure 94/61, pulse 84 and  regular, respirations 25, temperature is 97.0.  Pulse ox 96% on 4 liters.  GENERAL:  The patient was an elderly black man in slight respiratory  discomfort.  He was alert and oriented, though not a good historian.  HEENT:  Unremarkable.  NECK:  Without thyromegaly or adenopathy.  Carotid pulses  were palpable  bilaterally without bruits.  CARDIAC:  Revealed a normal S1 and S2.  There was no S3, S4, murmur, rub, or  click.  Cardiac rhythm is regular.  No chest wall tenderness was noted.  LUNGS:  Revealed mild diffuse expiratory wheezes.  There were no rales.  ABDOMEN:  Soft and nontender.  There was no mass, hepatosplenomegaly, bruit,  distention, rebound, guarding, or rigidity.  Bowel sounds are normal.  RECTAL:  Not performed as they were not pertinent to the reason for acute  care hospitalization.  GENITAL:  Not performed as they were not pertinent to the reason for acute  care hospitalization.  EXTREMITIES:  Without edema, deviation, or deformity.  Radial and dorsalis  pedal pulses were palpable bilaterally.  NEURO:  Brief screening neurologic survey was unremarkable.   The electrocardiogram revealed a normal sinus rhythm, with a left bundle  branch block.   The chest  radiograph, according to the radiologist, demonstrated no evidence  of congestive heart failure.   The initial set of cardiac markers revealed a myoglobin of 95.3, CK-MB 1.6,  and troponin less than 0.05.  White count was 8.4 with a hemoglobin of 15.4  and hematocrit of 45.2.  INR was 1.4.  The initial biochemistry series  revealed a potassium of 6.2, BUN 11, and creatinine 1.3.  A repeat  biochemistry series later in his emergency department stay revealed a  potassium of 3.8 and BUN 11.  Remaining studies were pending at the time of  this dictation.   IMPRESSION:  1.  Chronic obstructive pulmonary disease exacerbation; oxygen dependent      chronic obstructive pulmonary disease.  2.  Chest pain rule out myocardial infarction, new left beta-blocker.  3.  Ischemic cardiomyopathy.  Ejection fraction 21% without reversible      ischemia by recent stress Cardiolite.  4.  Hypertension.  5.  Dyslipidemia.  6.  Gastroesophageal reflux.  7.  Hyperkalemia.   PLAN:  1.  Cardiac stepdown unit.  2.  Serial cardiac enzymes.  3.  Aspirin.  4.  Heparin IV (INR not therapeutic).  5.  Intravenous nitroglycerin.  6.  Oxygen.  7.  Nebulized mist treatments.  8.  Continue diuretics.  9.  Treat elevated potassium.  10. Discontinue smoking.  11. Daily weights and input and output.  12. Further measures per Dr. Katrinka Blazing.      [place]c   MSC/MEDQ  D:  11/29/2003  T:  11/29/2003  Job:  [id number]   cc:   Quita Skye. Waldon Reining, MD  [address]  [place], [place] [address]  Fax: [phone]   Lyn Records III, M.D.  [address]  [address]  [place]  [place] [address]  Fax: [phone]

## 2010-06-07 NOTE — Discharge Summary (Signed)
NAME:  Robert Davies, Robert Davies NO.:  [id number]   MEDICAL RECORD NO.:  [id number]                   PATIENT TYPE:  INP   LOCATION:  2011                                 FACILITY:  [facility]   PHYSICIAN:  Jackie Plum, M.D.             DATE OF BIRTH:  07/31/1929   DATE OF ADMISSION:  07/17/2003  DATE OF DISCHARGE:  07/22/2003                                 DISCHARGE SUMMARY   DISCHARGE DIAGNOSES:  1. Acute exacerbation of congestive heart failure, new-onset, resolved.  2. Chronic obstructive pulmonary disease exacerbation secondary to #1,     resolved.  3. History of severe chronic obstructive pulmonary disease on home oxygen.  4. History of hypertension.  5. History of benign prostatic hypertrophy.  6. History of cigarette smoking.  7. History of esophageal reflux.   DISCHARGE LABORATORY DATA:  WBC count 7.9, hemoglobin 13.2, hematocrit 39.4,  MCV 86.9, platelet count 240.  Sodium 139, potassium 3.9, chloride 102, CO2  31, glucose 107, BUN 14, creatinine 1.1, calcium 8.6.  Total cholesterol  166, triglycerides 116, HDL 36, LDL 107.   PERTINENT DIAGNOSTIC WORKUP IN THE HOSPITAL:  1. X-ray of the chest on admission:  Vascular congestion, interstitial     edema.  2. 2 D echo done on July 18, 2003:  Notable for severely reduced EF of 10-     20%.  Right atrial dilatation.  Inferior vena cava dilatation.  Left     ventricular dilatation.  3. Stress Cardiolite done July 21, 2003:  Notable for EF of 21%, no ischemia,     questionable inferior wall scar.   DISCHARGE MEDICATIONS:  1. Aspirin 81 mg p.o. daily.  2. Atenolol 25 mg p.o. daily.  3. Lotensin 10 mg p.o. daily.  4. Lasix 40 mg p.o. daily.  5. Combivent MDI two puffs q.i.d.  6. K-Dur 10 mEq p.o. daily.  7. Zocor 20 mg p.o. daily.   He is to continue his oxygen at 2.5 L/min. by nasal cannula as previously.  Activity as tolerated.  Diet should be cardiac diet.   SPECIAL INSTRUCTIONS:  The patient  is instructed to report to his primary  care physician if any problems, including fever, chills, shortness of  breath, or chest pain.  He is to follow up with Lilla Shook, M.D.,  patient's PCP, in 10-14 days.  He is to call for appointment.   CONSULTANTS:  Lyn Records, M.D., of [facility].   PROCEDURE:  Stress Cardiolite as noted above.   CONDITION ON DISCHARGE:  Improved and satisfactory.   REASON FOR HOSPITALIZATION:  Chest pain.  Please see admission H&P by Candyce Churn, M.D., for full details with regard to patient's presentation.  Basically, the patient is a 77 year old African-American gentleman with  history of oxygen-dependent COPD, hypertension, and chronic severe smoking.  He presented with a 24-hour history of severe pressure-like pain over his  sternum like  three men sitting on his chest.  Pain was radiating to his left  _________ and arm.  He also had some shortness of breath without  diaphoresis, nausea, or vomiting.  His pain was also noted to be worse with  lying down apparently.  At the ED the patient was given two sublingual  nitroglycerin for relief of his pain and the hospitalist service was asked  to evaluate for admission.   According to the H&P by Dr. Johnella Moloney, the patient was on aspirin at  home but has not been taking this medication and the other medicines he was  on at the time of admission was albuterol nebulizers and oxygen by nasal  cannula.   PHYSICAL EXAMINATION:  VITAL SIGNS:  Admitting vital signs notable for a BP  of 101/63, saturation of 91% on 2 L, pulse of 60.  He was said to be  afebrile.  NECK:  Supple without any JVD.  CARDIAC:  He had regular rhythm without any murmur.  CHEST:  He had diffusely decreased breath sounds on lung exam without any  wheezes.  ABDOMEN:  Said to be soft and nontender.  GENITOURINARY:  He did not have any GU abnormality.  EXTREMITIES:  Trace pedal edema was noted on exam.   Chest  x-ray was reviewed, cardiomegaly with vascular congestion as mentioned  above.  EKG revealed sinus rhythm with frequent PACs.  He had a left bundle  branch block.  CBC did not reveal any leukocytosis.  Point of care cardiac  enzymes were unremarkable.  He was therefore admitted to the hospitalist  service for management and evaluation of his chest pain.   HOSPITAL COURSE:  The patient was put on telemetry monitoring.  There were  no significant dysrhythmias.  Serial cardiac enzymes were obtained and were  negative for myocardial infarction.  He was continued on Lovenox with a  nitroglycerin patch and O2 supplementation as well as Protonix.  On rounds  by me this morning, the patient's examination revealed bilateral wheezes.  He was chest pain-free, and on review of the x-ray he had some edema and  therefore a BNP was obtained, which came out to be high at 893.5.  At this  time the patient was chest pain-free, and cardiac enzymes were negative.  Follow-up echocardiogram indicated severe systolic dysfunction.  The patient  already had been started on Lasix for diuresis.  We also started him on  nebulizations for his COPD exacerbation, the exacerbating factor being his  obvious CHF.  The patient was seen in consultation by Dr. Verdis Prime of  [facility] on July 18, 2003.  His impression was congestive heart  failure.  He agreed with a 2 D echo for assessment of his LV function and  noted that the patient should be evaluated with a stress test to rule out  any  ischemia after improvement of his pulmonary status.  Atenolol was added  to the patient's medication regimen without any deleterious pulmonary side  effects.  The patient tolerated his medication well.  He had a stress  Cardiolite as noted above and this showed an EF of 21% without any ischemia.  The patient was seen by Dr. Katrinka Blazing of cardiology yesterday, July 21, 2003. After review of the patient's Cardiolite, he recommended  optimization of the  patient's CHF with ACE inhibitors, a beta blocker, and diuretics and  monitoring of his electrolytes by his PCP at this time.   PHYSICAL EXAMINATION:  GENERAL:  On rounds  this morning, Mr. Wanke feels  fine, does not have any chest pain, and no shortness of breath, and he is  back to his baseline cardiopulmonary status, and he is ready for discharge.  VITAL SIGNS:  His telemetry monitoring did not reveal any dysrhythmias.  His  BP was 96/58, pulse rate of 70, temperature of 97.0 degrees Fahrenheit, O2  saturation of 94% on 2.5 L.  CHEST:  Lung exam notable for vesicular breath sounds without any wheezes.  CARDIAC:  Notable for regular rate and rhythm without any gallops or murmur.  ABDOMEN:  Soft, nontender.  Bowel sounds are present.  They were  normoactive.  EXTREMITIES:  Negative for any edema.  NEUROLOGIC:  He is alert and oriented x3.   I spent some time to discuss with patient the need to be compliant with all  his medications.  We discussed all the workup that was done for the patient  while he was in the hospital and the rationale behind this workup.  Also  explained to him his scheduled outpatient follow-up by his primary care  physician for continued optimization of his anti-ischemic, for continued CHF  treatment monitoring and optimization as deemed appropriate as well as  monitoring of his electrolytes.  The patient expressed understanding.  I  also took this opportunity to revisit the issue of cigarette smoking.  He  said that he wants to quit smoking but does not wish for any patches for  now.  After careful discussion with the patient, he understands that further  smoking will hurt his general health, including his cardiopulmonary status.  The patient was seen by smoking cessation on July 18, 2003, at which time he  was appropriately advised and educated.  We recommend continued outpatient  counseling and encouragement with his primary care  physician in this regard.   I spent more than 30 minutes preparing this patient for discharge today.                                                Jackie Plum, M.D.    GO/MEDQ  D:  07/22/2003  T:  07/22/2003  Job:  [id number]   cc:   Lilla Shook, M.D.  [address]  [place]  [place] [address]  Fax: [phone]   Lyn Records III, M.D.  [address]  [address]  [place]  [place] [address]  Fax: [phone]

## 2010-06-07 NOTE — H&P (Signed)
NAME:  Robert Davies, ALTHAUS NO.:  [id number]   MEDICAL RECORD NO.:  [id number]                   PATIENT TYPE:  EMS   LOCATION:  MAJO                                 FACILITY:  [facility]   PHYSICIAN:  Thora Lance, M.D.               DATE OF BIRTH:  07/31/1929   DATE OF ADMISSION:  02/27/2003  DATE OF DISCHARGE:                                HISTORY & PHYSICAL   CHIEF COMPLAINT:  Cough.   HISTORY OF PRESENT ILLNESS:  This is a 76 year old actively smoking black  male with a history of COPD and asthma who presents with a 24-hour history  of severe cough.  The cough was sudden in onset during the afternoon of  February 26, 2003.  It has been productive of clear, and now more greenish  sputum.  It has been associated with increasing shortness of breath on  exertion and no wheezing.  The patient continues to actively smoke 1 pack of  cigarettes per day.  He denies any fevers, chills, or chest pain.   PAST MEDICAL HISTORY:  1. COPD/asthma.  2. Hypertension.  3. GERD.  4. BPH.   SURGICAL HISTORY:  1. Tonsillectomy and adenoidectomy.  2. Cervical disk surgery in 1988.   ALLERGIES:  No known drug allergies.   CURRENT MEDICATIONS:  1. Lotrel 5/10 one p.o. daily.  2. Cosopt one OU b.i.d.  3. Aspirin 325 mg p.o. daily.  4. Protonix 40 mg p.o. daily.  5. Clarinex 5 mg p.o. daily.  6. Albuterol nebulizer 2.5 mg t.i.d.  7. Oxygen 2.5 liters nasal cannula p.m. only.   FAMILY HISTORY:  Mother died of throat cancer.  Father lived to late 47's.  Brother died of lung cancer.  Daughter died of lung cancer.   SOCIAL HISTORY:  The patient is divorced x2.  He has 2 daughters living.  He  lives alone in [place].  Actively smoking 1 pack of cigarettes a day.  No  alcohol.   REVIEW OF SYSTEMS:  As above.   PHYSICAL EXAMINATION:  GENERAL:  An elderly black male appearing comfortable  lying supine.  VITAL SIGNS:  Blood pressure 117/71, heart rate 96, respirations  32,  temperature 95.0.  Saturation 85% on room air.  HEENT:  Pupils equal, round and reactive to light.  Extraocular movements  intact.  Ears - TM's clear.  Oropharynx clear.  NECK:  Supple.  No lymphadenopathy.  No carotid bruits.  LUNGS:  Decreased breath sounds bilaterally with respiratory wheezes  bilaterally.  HEART:  Regular rate and rhythm without murmurs, gallops or rubs.  ABDOMEN:  Soft, nontender.  No masses or hepatosplenomegaly.  GU:  Bilaterally descended testicles without nodules.  RECTAL:  Deferred.  EXTREMITIES:  No edema.  Peripheral pulses normal.  NEUROLOGIC:  Nonfocal.   LABORATORY DATA:  CBC with WBC 13.7, hemoglobin 14.8, platelet count 307  with a left shift.  ABG on 2.5  liters revealed pH of 7.34, PCO2 of 51.6, PO2  83.0.  Chemistry - sodium 141, potassium 2.9, chloride 107, bicarb 28,  glucose 148, BUN 14, creatinine 1.2, calcium 8.8.  Chest x-ray shows no  infiltrate.   ASSESSMENT AND PLAN:  Chronic obstructive pulmonary disease exacerbation.  No evidence of definite pneumonia on chest x-ray.  The patient has mild  hypercapnia and hypoxemia.  Plan - admit to the medical floor.  Rocephin IV.  Solu-Medrol IV.  Albuterol and Atrovent nebulizers q.6h. and p.r.n.  Tussionex p.r.n.  IV maintenance fluids.  Follow blood sugars and  __________.                                                Thora Lance, M.D.    Delorse Limber  D:  02/27/2003  T:  02/28/2003  Job:  [id number]   cc:   Shan Levans, M.D. [facility]   Lilla Shook, M.D.  [address]  [place]  [place] [address]  Fax: [phone]

## 2010-06-07 NOTE — Op Note (Signed)
   NAME:  Robert Davies, Robert Davies                        ACCOUNT NO.:  [id number]   MEDICAL RECORD NO.:  [id number]                   PATIENT TYPE:  AMB   LOCATION:  ENDO                                 FACILITY:  [facility]   PHYSICIAN:  John C. Madilyn Fireman, M.D.                 DATE OF BIRTH:  07/31/1929   DATE OF PROCEDURE:  06/22/2002  DATE OF DISCHARGE:                                 OPERATIVE REPORT   PROCEDURE:  Colonoscopy with polypectomy.   INDICATIONS FOR PROCEDURE:  History of adenomatous colon polyps three years  ago.   DESCRIPTION OF PROCEDURE:  The patient was placed in the left lateral  decubitus position then placed on the pulse monitor with continuous low flow  oxygen delivered by nasal cannula. He was sedated with 62.5 mcg IV fentanyl  6 mg IV Versed. The Olympus video colonoscope was inserted into the rectum  and advanced to the cecum, confirmed by transillumination at McBurney's  point and visualization of the ileocecal valve and appendiceal orifice. The  prep was excellent. The cecum appeared normal with no masses, polyps,  diverticula or other mucosal abnormalities. Within the ascending colon,  there was seen a sessile 8 mm polyp which was fulgurated by hot biopsy. The  remainder of the ascending colon appeared normal. Within the transverse  colon, there was seen a small sessile polyp approximately 6 mm in diameter  and this was fulgurated by hot biopsy as well. The remainder of the  transverse, descending, sigmoid, and rectum appeared normal with no further  polyps, masses, diverticula or other mucosal abnormalities. Retroflexed view  of the anus revealed no obvious internal hemorrhoids.  The colonoscope was  then withdrawn and the patient returned to the recovery room in stable  condition. He tolerated the procedure well and there were no immediate  complications.   IMPRESSION:  Two small colon polyps.   PLAN:  Await histology to determine method and interval for future  colon  screening.                                               John C. Madilyn Fireman, M.D.    JCH/MEDQ  D:  06/22/2002  T:  06/22/2002  Job:  [id number]

## 2010-07-11 ENCOUNTER — Encounter: Payer: Medicare Other | Admitting: *Deleted

## 2010-07-11 ENCOUNTER — Ambulatory Visit (INDEPENDENT_AMBULATORY_CARE_PROVIDER_SITE_OTHER): Payer: Medicare Other | Admitting: *Deleted

## 2010-07-11 DIAGNOSIS — I429 Cardiomyopathy, unspecified: Secondary | ICD-10-CM

## 2010-07-15 ENCOUNTER — Other Ambulatory Visit: Payer: Self-pay

## 2010-07-23 NOTE — Progress Notes (Signed)
icd remote check  

## 2010-07-31 ENCOUNTER — Encounter: Payer: Self-pay | Admitting: *Deleted

## 2010-08-02 ENCOUNTER — Telehealth: Payer: Self-pay | Admitting: Internal Medicine

## 2010-08-02 NOTE — Telephone Encounter (Signed)
Patient calling C/O drainage from pacemaker site.

## 2010-08-02 NOTE — Telephone Encounter (Signed)
Pt calling stating pacer insertion site warm to touch,puffy, tender, and draining brown tinged fluid,no temperarure--advised to come in Monday to have nurse check--pt agrees--nt

## 2010-08-05 ENCOUNTER — Ambulatory Visit (INDEPENDENT_AMBULATORY_CARE_PROVIDER_SITE_OTHER): Payer: Medicare Other | Admitting: *Deleted

## 2010-08-05 DIAGNOSIS — B999 Unspecified infectious disease: Secondary | ICD-10-CM

## 2010-08-05 DIAGNOSIS — I428 Other cardiomyopathies: Secondary | ICD-10-CM

## 2010-08-05 MED ORDER — SULFAMETHOXAZOLE-TRIMETHOPRIM 800-160 MG PO TABS: 1.0000 | ORAL_TABLET | Freq: Two times a day (BID) | ORAL | Status: AC

## 2010-08-05 NOTE — Progress Notes (Signed)
Addended by: Alma Friendly on: 08/05/2010 12:23 PM   Modules accepted: Orders

## 2010-08-05 NOTE — Progress Notes (Signed)
Addended by: Vella Kohler on: 08/05/2010 12:12 PM   Modules accepted: Level of Service

## 2010-08-05 NOTE — Progress Notes (Signed)
Addended by: Alma Friendly on: 08/05/2010 12:10 PM   Modules accepted: Orders

## 2010-08-05 NOTE — Progress Notes (Signed)
Addended by: Alma Friendly on: 08/05/2010 12:07 PM   Modules accepted: Orders

## 2010-08-05 NOTE — Progress Notes (Signed)
Pt had wound check in device clinic/ pt having blood cultures x 2 as pre Dr. Johney Frame.

## 2010-08-06 LAB — CBC WITH DIFFERENTIAL/PLATELET
Basophils Absolute: 0 10*3/uL (ref 0.0–0.1)
Basophils Relative: 0 % (ref 0–1)
Eosinophils Relative: 4 % (ref 0–5)
HCT: 43.5 % (ref 39.0–52.0)
Lymphocytes Relative: 24 % (ref 12–46)
MCHC: 32 g/dL (ref 30.0–36.0)
MCV: 91.2 fL (ref 78.0–100.0)
Monocytes Absolute: 0.6 10*3/uL (ref 0.1–1.0)
Neutro Abs: 5.1 10*3/uL (ref 1.7–7.7)
Platelets: 154 10*3/uL (ref 150–400)
RDW: 15 % (ref 11.5–15.5)
WBC: 7.9 10*3/uL (ref 4.0–10.5)

## 2010-08-07 ENCOUNTER — Ambulatory Visit (HOSPITAL_BASED_OUTPATIENT_CLINIC_OR_DEPARTMENT_OTHER)
Admission: RE | Admit: 2010-08-07 | Discharge: 2010-08-07 | Disposition: A | Discharge status: Home / Self Care | Payer: Medicare Other | Source: Ambulatory Visit | Attending: Specialist | Admitting: Specialist

## 2010-08-07 DIAGNOSIS — Z01812 Encounter for preprocedural laboratory examination: Secondary | ICD-10-CM | POA: Insufficient documentation

## 2010-08-07 DIAGNOSIS — H269 Unspecified cataract: Secondary | ICD-10-CM | POA: Insufficient documentation

## 2010-08-07 LAB — POCT I-STAT 4, (NA,K, GLUC, HGB,HCT)
Hemoglobin: 15 g/dL (ref 13.0–17.0)
Potassium: 4 mEq/L (ref 3.5–5.1)

## 2010-08-07 LAB — GLUCOSE, CAPILLARY: Glucose-Capillary: 115 mg/dL — ABNORMAL HIGH (ref 70–99)

## 2010-08-09 ENCOUNTER — Encounter: Payer: Self-pay | Admitting: Internal Medicine

## 2010-08-11 LAB — CULTURE, BLOOD (SINGLE): Organism ID, Bacteria: NO GROWTH

## 2010-08-13 ENCOUNTER — Encounter: Payer: Self-pay | Admitting: Internal Medicine

## 2010-08-13 ENCOUNTER — Ambulatory Visit (INDEPENDENT_AMBULATORY_CARE_PROVIDER_SITE_OTHER): Payer: Medicare Other | Admitting: Internal Medicine

## 2010-08-13 DIAGNOSIS — T827XXA Infection and inflammatory reaction due to other cardiac and vascular devices, implants and grafts, initial encounter: Secondary | ICD-10-CM

## 2010-08-13 DIAGNOSIS — Z95810 Presence of automatic (implantable) cardiac defibrillator: Secondary | ICD-10-CM

## 2010-08-13 DIAGNOSIS — I5022 Chronic systolic (congestive) heart failure: Secondary | ICD-10-CM

## 2010-08-13 DIAGNOSIS — I429 Cardiomyopathy, unspecified: Secondary | ICD-10-CM

## 2010-08-13 DIAGNOSIS — I6789 Other cerebrovascular disease: Secondary | ICD-10-CM

## 2010-08-14 ENCOUNTER — Encounter: Payer: Self-pay | Admitting: Internal Medicine

## 2010-08-14 DIAGNOSIS — T827XXA Infection and inflammatory reaction due to other cardiac and vascular devices, implants and grafts, initial encounter: Secondary | ICD-10-CM | POA: Insufficient documentation

## 2010-08-14 DIAGNOSIS — Z95810 Presence of automatic (implantable) cardiac defibrillator: Secondary | ICD-10-CM | POA: Insufficient documentation

## 2010-08-14 NOTE — Telephone Encounter (Signed)
Pt was seen on yesterday, has question regarding appt on yesterday.

## 2010-08-14 NOTE — Assessment & Plan Note (Signed)
Stable

## 2010-08-14 NOTE — Assessment & Plan Note (Signed)
Issues are as outlined  Below

## 2010-08-14 NOTE — Progress Notes (Signed)
HPI  Robert Davies is a 77 y.o. male Seen in followup for CRT-D implanted for CHF and NICM  initially in     With generator replacement in 2009.   He presented a week or so ago with comlaints of drainage from  the lateral aspect of wound that was painless.  He was seen by Dr Johney Frame who put him on antibiotics. There has been no improvement.  The patient reports a prior ICD discharge. I cannot find a record of that in the available medical record  He has O2dependent COPD and stable chronic DOE;; no significant edema      Past Medical History  Diagnosis Date  . Emphysema   . Stroke   . HLD (hyperlipidemia)   . MI (myocardial infarction)     hx  . ICD (implantable cardiac defibrillator) in place     CRT St Jude; remote - yes  . Chronic systolic heart failure   . Cardiomyopathy secondary     Past Surgical History  Procedure Date  . Cervical discectomy   . Tonsillectomy   . Adenoidectomy   . Pacemaker insertion     x2    Current Outpatient Prescriptions  Medication Sig Dispense Refill  . albuterol (PROVENTIL) (2.5 MG/3ML) 0.083% nebulizer solution Take 2.5 mg by nebulization 2 (two) times daily.        . bisacodyl (DULCOLAX) 5 MG EC tablet Take 5 mg by mouth daily.        . dorzolamide (TRUSOPT) 2 % ophthalmic solution Place 1 drop into both eyes 2 (two) times daily.        . famotidine (PEPCID) 20 MG tablet Take 20 mg by mouth at bedtime.        . Fluticasone-Salmeterol (ADVAIR DISKUS) 250-50 MCG/DOSE AEPB Inhale 1 puff into the lungs 2 (two) times daily.        . furosemide (LASIX) 40 MG tablet Take 40 mg by mouth daily.        Marland Kitchen glimepiride (AMARYL) 2 MG tablet Take 2 mg by mouth 2 (two) times daily.        . iron polysaccharides (NIFEREX) 150 MG capsule Take 150 mg by mouth daily.        . metoprolol (TOPROL-XL) 50 MG 24 hr tablet Take 50 mg by mouth daily.        . NON FORMULARY Oxygen       . potassium chloride (KLOR-CON) 10 MEQ CR tablet Take 10 mEq by mouth 2  (two) times daily.        . simvastatin (ZOCOR) 20 MG tablet Take 20 mg by mouth at bedtime.        . Tamsulosin HCl (FLOMAX) 0.4 MG CAPS Take 0.4 mg by mouth daily.        Marland Kitchen tiotropium (SPIRIVA) 18 MCG inhalation capsule Place 18 mcg into inhaler and inhale daily.        Marland Kitchen warfarin (COUMADIN) 5 MG tablet Take 5 mg by mouth as directed.          Allergies not on file  Review of Systems negative except from HPI and PMH  Physical Exam Well developed and well nourished in no acute distress Wearing oxygen HENT normal Except poor dentition E scleral and icterus clear Neck Supple JVP flat; carotids brisk and full Clear to ausculation The lateral aspect of the device was explored. No obvious defibrillator contact was present. Dr. Ladona Ridgel looked at also. There is a small aspect of heme in  the center of the granulation pocket suggesting the site of the fistula leading from the pocket Regular rate and rhythm, no murmurs gallops or rub Soft with active bowel sounds No clubbing cyanosis and edema Alert and oriented, grossly normal motor and sensory function Skin Warm and Dry   Assessment and  Plan

## 2010-08-14 NOTE — Telephone Encounter (Signed)
I spoke with the patient. He states that he spoke with the eye doctor who was going to do his cataract surgery and they encouraged him to proceed with taking care of his device first. I explained I will forward this message to Dr. Ladona Ridgel and his nurse [name] and they should be in touch with him most likely by the end of the week about getting him set up for his device explant. He voices understanding.

## 2010-08-14 NOTE — Assessment & Plan Note (Addendum)
The patient has a draining sinus from the lateral aspect of the wound. Given the lack of erythema and warmth, it is almost certainly staph epi. The device will need to be removed. The patient was seen today in conjunction with Dr. Ladona Ridgel. We discussed this with the family. Dr. Ladona Ridgel will be in touch with them to schedule device extraction. The Patient will need subsequent implantation on the right side  The courtesy issue of his warfarin prior to explantation, specifically his indication and does he need bridging.

## 2010-08-16 ENCOUNTER — Telehealth: Payer: Self-pay | Admitting: Internal Medicine

## 2010-08-16 NOTE — Telephone Encounter (Signed)
Per [name] she needs to discuss w/Dr Ladona Ridgel when procedure can be done and he will not be in the office until Tue, she is looking at either week of 8/6 or 8/13, pt is aware and verbalized understanding that we will call him back on Tue after speaking w/Dr Ladona Ridgel

## 2010-08-16 NOTE — Op Note (Signed)
  NAME:  Robert Davies, Robert Davies NO.:  [id number]  MEDICAL RECORD NO.:  [id number]  LOCATION:                                 FACILITY:  PHYSICIAN:  Chucky May, M.D.  DATE OF BIRTH:  07/31/1929  DATE OF PROCEDURE: DATE OF DISCHARGE:                              OPERATIVE REPORT   PREOPERATIVE DIAGNOSIS:  Cataract, left eye.  POSTOPERATIVE DIAGNOSIS:  Cataract, left eye.  OPERATION PERFORMED:  Cataract extraction with intraocular lens implant, left eye.  SURGEON:  Chucky May, M.D.  ANESTHESIA:  MAC.  The outpatient setting is the appropriate setting for the procedure.  INDICATIONS FOR SURGERY:  The patient is a 77 year old male with painless progressive decrease in vision so he has difficulty seeing road signs and for watching television.  On examination, he was found to have a cataract consistent with decreasing visual acuity.  DESCRIPTION OF PROCEDURE:  The patient was brought to the main operating room and placed in the supine position.  Anesthesia was obtained by means of topical 4% lidocaine drops of tetracaine.  He was then prepped and draped in the usual manner.  A lid speculum was inserted and the cornea was entered with a 2.4 mm keratome temporally with an additional 1 mm port inferiorly.  Viscoat was instilled into the anterior chamber and an anterior capsulorrhexis was performed in a curvilinear fashion without difficulty.  The nucleus was hydrodissected with 1% nonpreserved lidocaine drops.  The nucleus was then mobilized and phacoemulsified without difficulty.  Residual cortical material was removed by irrigation-aspiration.  The posterior capsule was polished and a posterior chamber lens implant was placed in the bag without difficulty. Viscoelastic was removed and replaced with Balanced Salt Solution.  The wounds were hydrated with Balanced Salt Solution and checked for fluid leaks.  There was a small leak temporally despite hydration of  the wound, so a single 10-0 nylon stitch was placed over the wound temporally.  The wounds were again checked for fluid leaks and no leaks were noted.  The eye was dressed with topical Vigamox, Pred Forte and a Fox shield, and the patient was taken to the recovery area in excellent condition where he received written and verbal instructions for his postoperative care and was scheduled for followup in 24 hours.         ______________________________ Chucky May, M.D.    DJD/MEDQ  D:  08/07/2010  T:  08/07/2010  Job:  [id number]  Electronically Signed by Nelson Chimes M.D. on 08/16/2010 09:34:15 AM

## 2010-08-16 NOTE — Telephone Encounter (Signed)
Per pt call, pt wants to know if operations has been scheduled for pt pacemaker to be taken out. Please return call to pt to advise.

## 2010-08-16 NOTE — Telephone Encounter (Signed)
Pt has called again and has had no response from office regarding procedure.  He was told that he would be called today.  He is waiting on phone call.  [phone]

## 2010-08-16 NOTE — Telephone Encounter (Signed)
Patient was seen in Dr. Odessa Fleming clinic on 08/13/10. Dr. Graciela Husbands and Dr. Ladona Ridgel discussed with pt's family pt's pacemaker issue and   that Dr. Ladona Ridgel will be in touch with them to schedule device extraction. Pt states, it is urgent to have the pacemaker out. Patient is aware that Dr. Lubertha Basque nurse will in touch with Him soon, Pt would like to be call  today.

## 2010-08-20 ENCOUNTER — Ambulatory Visit (INDEPENDENT_AMBULATORY_CARE_PROVIDER_SITE_OTHER): Payer: Medicare Other | Admitting: *Deleted

## 2010-08-20 ENCOUNTER — Encounter: Payer: Self-pay | Admitting: *Deleted

## 2010-08-20 ENCOUNTER — Telehealth: Payer: Self-pay | Admitting: *Deleted

## 2010-08-20 DIAGNOSIS — T827XXA Infection and inflammatory reaction due to other cardiac and vascular devices, implants and grafts, initial encounter: Secondary | ICD-10-CM

## 2010-08-20 DIAGNOSIS — I6789 Other cerebrovascular disease: Secondary | ICD-10-CM

## 2010-08-20 DIAGNOSIS — I5022 Chronic systolic (congestive) heart failure: Secondary | ICD-10-CM

## 2010-08-20 DIAGNOSIS — E785 Hyperlipidemia, unspecified: Secondary | ICD-10-CM

## 2010-08-20 DIAGNOSIS — I429 Cardiomyopathy, unspecified: Secondary | ICD-10-CM

## 2010-08-20 LAB — PROTIME-INR: INR: 3.7 ratio — ABNORMAL HIGH (ref 0.8–1.0)

## 2010-08-20 LAB — CBC WITH DIFFERENTIAL/PLATELET
Basophils Absolute: 0.1 10*3/uL (ref 0.0–0.1)
Basophils Relative: 0.7 % (ref 0.0–3.0)
Eosinophils Relative: 2.8 % (ref 0.0–5.0)
HCT: 44 % (ref 39.0–52.0)
Hemoglobin: 14.1 g/dL (ref 13.0–17.0)
Lymphocytes Relative: 19.9 % (ref 12.0–46.0)
Lymphs Abs: 1.6 10*3/uL (ref 0.7–4.0)
Monocytes Relative: 8.5 % (ref 3.0–12.0)
Neutro Abs: 5.5 10*3/uL (ref 1.4–7.7)
RBC: 4.88 Mil/uL (ref 4.22–5.81)
WBC: 8.1 10*3/uL (ref 4.5–10.5)

## 2010-08-20 LAB — BASIC METABOLIC PANEL
Calcium: 9 mg/dL (ref 8.4–10.5)
GFR: 42.14 mL/min — ABNORMAL LOW (ref >60.00)
Potassium: 4.6 mEq/L (ref 3.5–5.1)
Sodium: 136 mEq/L (ref 135–145)

## 2010-08-20 NOTE — Telephone Encounter (Signed)
Patient will have device extracted 08/21/10 at 12:00  Patient aware

## 2010-08-20 NOTE — Telephone Encounter (Signed)
Spoke with patient and let him know that the lead extraction had been canceled and we will try and get this rescheduled on 09/05/10  Per Dr Ladona Ridgel we will have his labs done at the hospital

## 2010-09-03 NOTE — Telephone Encounter (Signed)
Pt procedure has been moved to 09/13/10  Will stop Coumadin 09/10/10

## 2010-09-10 ENCOUNTER — Encounter: Payer: Self-pay | Admitting: *Deleted

## 2010-09-13 ENCOUNTER — Inpatient Hospital Stay (HOSPITAL_COMMUNITY)
Admission: RE | Admit: 2010-09-13 | Discharge: 2010-09-15 | DRG: 261 | Disposition: A | Discharge status: Home / Self Care | Payer: Medicare Other | Source: Ambulatory Visit | Attending: Internal Medicine | Admitting: Internal Medicine

## 2010-09-13 ENCOUNTER — Ambulatory Visit (HOSPITAL_COMMUNITY): Payer: Medicare Other

## 2010-09-13 DIAGNOSIS — E119 Type 2 diabetes mellitus without complications: Secondary | ICD-10-CM | POA: Diagnosis present

## 2010-09-13 DIAGNOSIS — T82190A Other mechanical complication of cardiac electrode, initial encounter: Secondary | ICD-10-CM

## 2010-09-13 DIAGNOSIS — I428 Other cardiomyopathies: Secondary | ICD-10-CM | POA: Diagnosis present

## 2010-09-13 DIAGNOSIS — I509 Heart failure, unspecified: Secondary | ICD-10-CM | POA: Diagnosis present

## 2010-09-13 DIAGNOSIS — I5022 Chronic systolic (congestive) heart failure: Secondary | ICD-10-CM | POA: Diagnosis present

## 2010-09-13 DIAGNOSIS — B958 Unspecified staphylococcus as the cause of diseases classified elsewhere: Secondary | ICD-10-CM | POA: Diagnosis present

## 2010-09-13 DIAGNOSIS — Z9109 Other allergy status, other than to drugs and biological substances: Secondary | ICD-10-CM

## 2010-09-13 DIAGNOSIS — T827XXA Infection and inflammatory reaction due to other cardiac and vascular devices, implants and grafts, initial encounter: Principal | ICD-10-CM | POA: Diagnosis present

## 2010-09-13 DIAGNOSIS — Y838 Other surgical procedures as the cause of abnormal reaction of the patient, or of later complication, without mention of misadventure at the time of the procedure: Secondary | ICD-10-CM | POA: Diagnosis present

## 2010-09-13 DIAGNOSIS — E669 Obesity, unspecified: Secondary | ICD-10-CM | POA: Diagnosis present

## 2010-09-13 DIAGNOSIS — I252 Old myocardial infarction: Secondary | ICD-10-CM

## 2010-09-13 DIAGNOSIS — I10 Essential (primary) hypertension: Secondary | ICD-10-CM | POA: Diagnosis present

## 2010-09-13 DIAGNOSIS — J438 Other emphysema: Secondary | ICD-10-CM | POA: Diagnosis present

## 2010-09-13 DIAGNOSIS — Z7901 Long term (current) use of anticoagulants: Secondary | ICD-10-CM

## 2010-09-13 DIAGNOSIS — E785 Hyperlipidemia, unspecified: Secondary | ICD-10-CM | POA: Diagnosis present

## 2010-09-13 DIAGNOSIS — Z8673 Personal history of transient ischemic attack (TIA), and cerebral infarction without residual deficits: Secondary | ICD-10-CM

## 2010-09-13 LAB — BASIC METABOLIC PANEL
CO2: 28 mEq/L (ref 19–32)
Chloride: 104 mEq/L (ref 96–112)
Glucose, Bld: 128 mg/dL — ABNORMAL HIGH (ref 70–99)
Potassium: 4.3 mEq/L (ref 3.5–5.1)
Sodium: 139 mEq/L (ref 135–145)

## 2010-09-13 LAB — CBC
Hemoglobin: 14.5 g/dL (ref 13.0–17.0)
MCH: 29.6 pg (ref 26.0–34.0)
Platelets: 184 10*3/uL (ref 150–400)
RBC: 4.9 MIL/uL (ref 4.22–5.81)
WBC: 9.4 10*3/uL (ref 4.0–10.5)

## 2010-09-13 LAB — SURGICAL PCR SCREEN
MRSA, PCR: NEGATIVE
Staphylococcus aureus: NEGATIVE

## 2010-09-13 LAB — PROTIME-INR
INR: 1.56 — ABNORMAL HIGH (ref 0.00–1.49)
Prothrombin Time: 19 seconds — ABNORMAL HIGH (ref 11.6–15.2)

## 2010-09-14 ENCOUNTER — Inpatient Hospital Stay (HOSPITAL_COMMUNITY): Payer: Medicare Other

## 2010-09-14 DIAGNOSIS — I5022 Chronic systolic (congestive) heart failure: Secondary | ICD-10-CM

## 2010-09-15 LAB — CROSSMATCH
ABO/RH(D): O POS
Antibody Screen: NEGATIVE
Unit division: 0
Unit division: 0

## 2010-09-18 ENCOUNTER — Ambulatory Visit (INDEPENDENT_AMBULATORY_CARE_PROVIDER_SITE_OTHER): Payer: Medicare Other | Admitting: *Deleted

## 2010-09-18 DIAGNOSIS — I428 Other cardiomyopathies: Secondary | ICD-10-CM

## 2010-09-18 LAB — ICD DEVICE OBSERVATION: DEVICE MODEL ICD: 553295

## 2010-09-18 NOTE — Progress Notes (Signed)
Device explanted. Wound check

## 2010-09-20 ENCOUNTER — Ambulatory Visit (INDEPENDENT_AMBULATORY_CARE_PROVIDER_SITE_OTHER): Payer: Medicare Other | Admitting: *Deleted

## 2010-09-20 ENCOUNTER — Other Ambulatory Visit: Payer: Self-pay | Admitting: Internal Medicine

## 2010-09-20 DIAGNOSIS — R0989 Other specified symptoms and signs involving the circulatory and respiratory systems: Secondary | ICD-10-CM

## 2010-09-20 DIAGNOSIS — I428 Other cardiomyopathies: Secondary | ICD-10-CM

## 2010-09-20 LAB — ICD DEVICE OBSERVATION

## 2010-09-24 ENCOUNTER — Ambulatory Visit (INDEPENDENT_AMBULATORY_CARE_PROVIDER_SITE_OTHER): Payer: Medicare Other | Admitting: *Deleted

## 2010-09-24 ENCOUNTER — Other Ambulatory Visit: Payer: Self-pay | Admitting: Internal Medicine

## 2010-09-24 DIAGNOSIS — R0989 Other specified symptoms and signs involving the circulatory and respiratory systems: Secondary | ICD-10-CM

## 2010-09-24 DIAGNOSIS — I428 Other cardiomyopathies: Secondary | ICD-10-CM

## 2010-09-24 LAB — ICD DEVICE OBSERVATION

## 2010-09-25 ENCOUNTER — Other Ambulatory Visit: Payer: Self-pay | Admitting: Internal Medicine

## 2010-09-25 ENCOUNTER — Ambulatory Visit (INDEPENDENT_AMBULATORY_CARE_PROVIDER_SITE_OTHER): Payer: Medicare Other | Admitting: *Deleted

## 2010-09-25 DIAGNOSIS — I428 Other cardiomyopathies: Secondary | ICD-10-CM

## 2010-09-25 DIAGNOSIS — R0989 Other specified symptoms and signs involving the circulatory and respiratory systems: Secondary | ICD-10-CM

## 2010-09-30 ENCOUNTER — Encounter: Payer: Medicare Other | Admitting: *Deleted

## 2010-09-30 ENCOUNTER — Telehealth: Payer: Self-pay | Admitting: Internal Medicine

## 2010-09-30 NOTE — Telephone Encounter (Signed)
Pt's incision site swollen, having redness and warmth

## 2010-09-30 NOTE — Telephone Encounter (Addendum)
Pt called to report that he was in and saw Dr Ladona Ridgel on Wed.  His pacemaker explant sutures were removed at that time.  On Sat, he reports that his suture line "opened up and a lot of blood came out".  He states it was a very small area (one suture length), that opened up.  By Sunday, he states his swelling was about half of what it was on Wed.  He is also reporting less redness and states it is not warm to the touch.  No pus noted and no bleeding currently.  This information was reported to [facility], Dr Lubertha Basque nurse.   I called pt back and he states area is currently closed and will watch it closely.  If bleeding starts or he notices any signs of infection, he will call us back.  I also told him not to get the area wet or take a shower if there is any question of the incision line having any open area.

## 2010-10-09 ENCOUNTER — Other Ambulatory Visit: Payer: Self-pay | Admitting: Internal Medicine

## 2010-10-09 ENCOUNTER — Ambulatory Visit (INDEPENDENT_AMBULATORY_CARE_PROVIDER_SITE_OTHER): Payer: Medicare Other | Admitting: *Deleted

## 2010-10-09 DIAGNOSIS — R0989 Other specified symptoms and signs involving the circulatory and respiratory systems: Secondary | ICD-10-CM

## 2010-10-09 DIAGNOSIS — I428 Other cardiomyopathies: Secondary | ICD-10-CM

## 2010-10-09 NOTE — Progress Notes (Signed)
Wound re check. 

## 2010-10-16 ENCOUNTER — Encounter: Payer: Medicare Other | Admitting: Internal Medicine

## 2010-10-17 LAB — PROTIME-INR
INR: 1.9 — ABNORMAL HIGH
Prothrombin Time: 23 — ABNORMAL HIGH

## 2010-10-24 NOTE — Discharge Summary (Signed)
NAME:  Robert Davies, Robert Davies NO.:  [id number]  MEDICAL RECORD NO.:  [id number]  LOCATION:  3705                         FACILITY:  [facility]  PHYSICIAN:  Hillis Range, MD       DATE OF BIRTH:  07/31/1929  DATE OF ADMISSION:  09/13/2010 DATE OF DISCHARGE:  09/15/2010                              DISCHARGE SUMMARY   PRIMARY CARDIOLOGIST:  Duke Salvia, MD, [facility]  PRIMARY CARE PHYSICIAN:  Georgann Housekeeper, MD  PROCEDURES PERFORMED DURING HOSPITALIZATION:  Removal of an ICD system, which was infected on September 13, 2010, by Dr. Lewayne Bunting.  FINAL DISCHARGE DIAGNOSES: 1. Nonischemic cardiomyopathy.     a.     CRTD implanted for congestive heart failure and nonischemic      cardiomyopathy in 2009.     b.     Removal of same secondary to infection. 2. Emphysema. 3. Cerebrovascular accident. 4. Hyperlipidemia. 5. Myocardial infarction. 6. Chronic systolic heart failure. 7. Obesity.  HOSPITAL COURSE:  This is a 77 year old male patient with a history of nonischemic cardiomyopathy, chronic systolic CHF, BiV ICD insertion in 2005 and generator change in 2009 who initially had a pocket hematoma and subsequently noted over the last few months that his pocket was draining fluid.  He was seen by Dr. Sherryl Manges in the office and it was felt that the patient would need to have the ICD pacemaker removed secondary to staph infection.  The patient was on Coumadin and this was discontinued prior to his surgery.  The patient was admitted and was seen by Dr. Lewayne Bunting during hospitalization where he did remove the ICD system.  The patient was treated with antibiotics and a pressure dressing was placed.  The patient was doing well postoperatively and followed closely by Dr. Johney Frame over the weekend for postoperative management.  The patient did well.  Medications were adjusted to include addition of lisinopril and metoprolol 25 mg daily.  He is going to follow up in the Device  Clinic for evaluation of his wound and removal of sutures.  He will discuss the possibility of reimplantation of ICD pacemaker with Dr. Graciela Husbands on a followup appointment to be made.  LABS ON DISCHARGE:  He is MRSA negative.  Sodium 139, potassium 4.3, chloride 104, CO2 of 28, glucose 128, BUN 14, creatinine 1.29. Hemoglobin 14.5, hematocrit 42.5, white blood cells 9.4, platelets 184. PTT 33, PT 19.0, INR 1.56.  RADIOLOGY: 1. Chest x-ray on admission, stable mild cardiomegaly with pacemaker,     stable mild chronic interstitial changes. 2. Chest x-ray on discharge, stable borderline cardiomegaly with     chronic interstitial lung disease.  No acute abnormality.  VITAL SIGNS ON DISCHARGE:  Blood pressure 121/66, pulse 65, respirations 18, temperature 99.1, O2 sat 96% on 3 L.  DISCHARGE MEDICATIONS: 1. Ramipril 10 mg one by mouth daily. 2. Hydrocodone/acetaminophen 5/325 one to two tablets by mouth q.6 h.     p.r.n. 3. Metoprolol 50 mg XL 1 tablet by mouth daily. 4. Pantoprazole 40 mg 1 tablet by mouth daily. 5. Albuterol nebulizer treatment 2.5 mg inhaled b.i.d. 6. Advair Diskus 1 puff inhaled b.i.d. 7. Chantix 1 mg by mouth b.i.d.  8. Cosopt 1 drop to both eyes b.i.d. 9. Ranitidine 20 mg 1 tablet by mouth b.i.d. 10.Furosemide 40 mg by mouth daily. 11.Glimepiride 2 mg 1 tablet by mouth b.i.d. 12.Iron dextran complex 1 cap by mouth b.i.d. 13.Potassium chloride 10 mEq 1 tablet by mouth b.i.d. 14.Simvastatin 20 mg 1 tablet by mouth every evening. 15.Spiriva 18 mcg 1 tablet by mouth daily. 16.Tamsulosin 0.4 mg capsule by mouth daily. 17.Warfarin 5 mg 1 tablet by mouth daily.  ALLERGIES:  ADHESIVES.  FOLLOWUP PLANS AND APPOINTMENTS: 1. The patient will follow up in the Pacemaker Device Clinic in 1 week     to have wound check and suture removal. 2. The patient will follow up with Dr. Sherryl Manges on a followup     appointment to be made concerning need for reimplantation.  3.   The     patient will follow up with Dr. Donette Larry for continued PT/INR     monitoring on Coumadin. 3. The patient has been advised on keeping site of pacemaker     extraction clean and dry. 4. The patient has been advised on medications, which are new     prescriptions for him for home and he is to bring all medications     to followup appointments.  Time spent with the patient to include physician time 35 minutes.    Bettey Mare. Lyman Bishop, NP   ______________________________ Hillis Range, MD   KML/MEDQ  D:  09/15/2010  T:  09/15/2010  Job:  [id number]  cc:   Georgann Housekeeper, MD  Electronically Signed by Joni Reining NP on 10/08/2010 04:46:09 PM Electronically Signed by Hillis Range MD on 10/24/2010 08:39:49 AM

## 2010-10-31 NOTE — Op Note (Signed)
NAME:  Robert Davies, Robert Davies NO.:  [id number]  MEDICAL RECORD NO.:  [id number]  LOCATION:  3705                         FACILITY:  [facility]  PHYSICIAN:  Doylene Canning. Ladona Ridgel, MD    DATE OF BIRTH:  07/31/1929  DATE OF PROCEDURE:  09/13/2010 DATE OF DISCHARGE:                              OPERATIVE REPORT   PROCEDURE PERFORMED:  Removal of an ICD system which was infected.  INTRODUCTION:  The patient is a very pleasant 77 year old male with a nonischemic cardiomyopathy, chronic systolic heart failure status post Bi-V ICD insertion initially in 2005 with generator change in 2009.  He initially had a pocket hematoma and subsequently noted in the last few months that his pocket was draining fluid.  He has had no fevers and chills.  Examination demonstrates a sinus connected to the old site with continuous drainage.  He is now referred for ICD extraction.  DESCRIPTION OF PROCEDURE:  After informed consent was obtained, the patient was taken to the operating room in a fasting state.  After usual preparation and draping, the general anesthesia was utilized by the Anesthesia Service.  30 mL of lidocaine was infiltrated over the old ICD insertion site and a 9-cm incision was carried out over this region, and electrocautery utilized to dissect down to the fascial plane.  The pocket was very densely scarred down.  There was chronic fibrotic tissue throughout the pocket approximately 4-5 cm thick.  The capsule of the including the device and leads were removed in total.  At this point, the capsule was dissected free of the device and the leads were extensively dissected.  It should be noted that it took nearly an hour to do all this.  At this point after the leads were completely freed up, the LV lead was chosen for initial removal.  An 0.014 Mailman angioplasty guidewire was advanced into the LV lead, and with gentle traction, the LV lead was removed in total.  Next, attention was  turned to the atrial lead.  A 52-cm stylet was advanced into the right atrial lead and the helix of the lead was retracted.  The body of the lead was also counterclockwise retracted and gentle traction was utilized again removing the lead in total.  At this point, attention was turned to the defibrillator lead.  A 60-cm stylet was advanced into the lead and the helix retracted.  Gentle traction.  However, failed to remove the lead. At this point, the lead was cut and a liberator locking stylet was advanced down into the tip of the lead.  It was fixed to the lead itself.  A silk suture was attached to the insulation at the proximal portion of the lead.  The Cook helical extraction sheath (Shortie) was advanced over the lead and used to enter the subclavian vein.  This was removed and the 13-French [facility] electrosurgical dissection sheath was advanced over the locking stylet and lead and utilizing a combination of pressure, counter pressure, traction, and counter traction.  The lead was removed in total.  It should be noted that there were dense fibrous adhesions around the proximal coil of lead, which were ultimately traversed without complication.  Pressure was  then held.  The patient's hemodynamics were stable.  A silk suture was placed to assure hemostasis.  Electrocautery was utilized to assure hemostasis in the pocket.  The pocket was irrigated and the incision was closed with 2-0 Prolene suture.  A pressure dressing was placed and the patient was returned to the recovery room for extubation and was in stable condition.  COMPLICATIONS:  There were no immediate procedure complications.  RESULTS:  This demonstrates successful ICD system extraction in a patient with a chronic ICD pocket infection.     Doylene Canning. Ladona Ridgel, MD     GWT/MEDQ  D:  09/13/2010  T:  09/13/2010  Job:  [id number]  cc:   Duke Salvia, MD, [facility] Georgann Housekeeper, MD  Electronically Signed by Lewayne Bunting MD on  10/31/2010 06:40:56 PM

## 2010-11-13 ENCOUNTER — Encounter: Payer: Medicare Other | Admitting: Internal Medicine

## 2010-11-20 ENCOUNTER — Encounter: Payer: Self-pay | Admitting: Internal Medicine

## 2010-11-20 ENCOUNTER — Ambulatory Visit (INDEPENDENT_AMBULATORY_CARE_PROVIDER_SITE_OTHER): Payer: Medicare Other | Admitting: Internal Medicine

## 2010-11-20 ENCOUNTER — Encounter: Payer: Self-pay | Admitting: *Deleted

## 2010-11-20 VITALS — BP 124/70 | HR 73 | Ht 66.0 in | Wt 204.0 lb

## 2010-11-20 DIAGNOSIS — Z95810 Presence of automatic (implantable) cardiac defibrillator: Secondary | ICD-10-CM

## 2010-11-20 DIAGNOSIS — I5022 Chronic systolic (congestive) heart failure: Secondary | ICD-10-CM

## 2010-11-20 LAB — CBC WITH DIFFERENTIAL/PLATELET
Basophils Relative: 0.3 % (ref 0.0–3.0)
Eosinophils Relative: 4.1 % (ref 0.0–5.0)
HCT: 41.7 % (ref 39.0–52.0)
Lymphs Abs: 1.9 10*3/uL (ref 0.7–4.0)
MCHC: 33.2 g/dL (ref 30.0–36.0)
MCV: 89.7 fl (ref 78.0–100.0)
Monocytes Absolute: 0.8 10*3/uL (ref 0.1–1.0)
Neutro Abs: 4.9 10*3/uL (ref 1.4–7.7)
RBC: 4.65 Mil/uL (ref 4.22–5.81)
WBC: 8 10*3/uL (ref 4.5–10.5)

## 2010-11-20 LAB — BASIC METABOLIC PANEL
CO2: 27 mEq/L (ref 19–32)
Chloride: 103 mEq/L (ref 96–112)
Potassium: 4.6 mEq/L (ref 3.5–5.1)

## 2010-11-20 NOTE — Assessment & Plan Note (Signed)
His symptoms have gone from class II to class III. He denies sodium indiscretion. He denies medical noncompliance. I have discussed the treatment options with the patient and the risks, goals, and affects, and expectations of biventricular pacemaker insertion had been discussed with the patient and he wishes to proceed. This will be scheduled the earliest possible convenience time

## 2010-11-20 NOTE — Progress Notes (Signed)
HPI Mr. Mees returns today for followup. He is a very pleasant 77-year-old man with a history of a nonischemic cardiomyopathy, chronic systolic heart failure ejection fraction 15%, status post BIV ICD insertion. The patient develop a chronic infected draining sinus in his ICD insertion site and underwent extraction of his entire system approximately 2 months ago. The procedure was complicated by a hematoma which has subsequently resolved. He denies fevers or chills. Overall he his only complaint is that of worsening fatigue and dyspnea with exertion. His heart failure which initially gone from class II is now class III. The patient's ejection fraction had initially improved after biventricular ICD implant. Now he is no longer has any BiV pacing. The patient denies syncope or chest pain. He denies peripheral edema. Allergies  Allergen Reactions  . Adhesive (Tape)      Current Outpatient Prescriptions  Medication Sig Dispense Refill  . bisacodyl (DULCOLAX) 5 MG EC tablet Take 5 mg by mouth daily.        . dorzolamide (TRUSOPT) 2 % ophthalmic solution Place 1 drop into both eyes 2 (two) times daily.        . enalapril (VASOTEC) 10 MG tablet Take 10 mg by mouth daily.        . famotidine (PEPCID) 20 MG tablet Take 20 mg by mouth at bedtime.        . Fluticasone-Salmeterol (ADVAIR DISKUS) 250-50 MCG/DOSE AEPB Inhale 1 puff into the lungs 2 (two) times daily.        . furosemide (LASIX) 40 MG tablet Take 40 mg by mouth daily.        . glimepiride (AMARYL) 2 MG tablet Take 2 mg by mouth 2 (two) times daily.        . iron polysaccharides (NIFEREX) 150 MG capsule Take 150 mg by mouth 2 (two) times daily.       . metoprolol (TOPROL-XL) 50 MG 24 hr tablet Take 50 mg by mouth daily.        . NON FORMULARY Oxygen       . potassium chloride (KLOR-CON) 10 MEQ CR tablet Take 10 mEq by mouth 2 (two) times daily.        . simvastatin (ZOCOR) 20 MG tablet Take 20 mg by mouth at bedtime.        . Tamsulosin HCl  (FLOMAX) 0.4 MG CAPS Take 0.4 mg by mouth daily.        . warfarin (COUMADIN) 5 MG tablet Take 5 mg by mouth as directed.           Past Medical History  Diagnosis Date  . Emphysema     Oxygen-dependent  . Stroke   . Chronic systolic heart failure   . Implantable cardiac defibrillator infection     Drainage identified July 2012  . ICD (implantable cardiac defibrillator) in place     CRT St Jude; remote - yes  . Cardiomyopathy secondary     Catheterization 2005 no obstructive coronary disease  . HLD (hyperlipidemia)     ROS:   All systems reviewed and negative except as noted in the HPI.   Past Surgical History  Procedure Date  . Cervical discectomy   . Tonsillectomy   . Adenoidectomy   . Pacemaker insertion     x2  . Doppler echocardiography 2005, 2011     Family History  Problem Relation Age of Onset  . Cancer Father   . Cancer Mother   . Cancer Daughter        History   Social History  . Marital Status: Widowed    Spouse Name: N/A    Number of Children: N/A  . Years of Education: N/A   Occupational History  . Not on file.   Social History Main Topics  . Smoking status: Former Smoker  . Smokeless tobacco: Not on file   Comment: started at age 14; smoked 1-2.5 ppd; quit 11/11  . Alcohol Use: No  . Drug Use: Not on file  . Sexually Active: Not on file   Other Topics Concern  . Not on file   Social History Narrative   Divorced, retired truck driver.      BP 124/70  Pulse 73  Ht 5' 6" (1.676 m)  Wt 204 lb (92.534 kg)  BMI 32.93 kg/m2  Physical Exam:  Well appearing NAD HEENT: Unremarkable Neck:  No JVD, no thyromegally Lymphatics:  No adenopathy Back:  No CVA tenderness Lungs:  Clear with no wheezes, rhonchi or increased work of breathing. Scattered basilar rales are present bilaterally. His old ICD insertion site is healing well. HEART:  Regular rate rhythm, no murmurs, no rubs, no clicks. A soft S3 gallop is present and S2 is  split Abd:  soft, positive bowel sounds, no organomegally, no rebound, no guarding Ext:  2 plus pulses, no edema, no cyanosis, no clubbing Skin:  No rashes no nodules Neuro:  CN II through XII intact, motor grossly intact  EKG Normal sinus rhythm with left bundle branch block.  Assess/Plan:   

## 2010-11-20 NOTE — Patient Instructions (Addendum)
Your physician has recommended that you have a Bi-Ventricular pacemaker inserted. A pacemaker is a small device that is placed under the skin of your chest or abdomen to help control abnormal heart rhythms. This device uses electrical pulses to prompt the heart to beat at a normal rate. Pacemakers are used to treat heart rhythms that are too slow. Wire (leads) are attached to the pacemaker that goes into the chambers of you heart. This is done in the hospital and usually requires and overnight stay. Please see the instruction sheet given to you today for more information.

## 2010-11-20 NOTE — Assessment & Plan Note (Signed)
He is status post extraction and appears to be stable. I have discussed the treatment options with the patient and have recommended insertion of a biventricular pacemaker to help improve his heart failure symptoms.

## 2010-11-21 ENCOUNTER — Telehealth: Payer: Self-pay | Admitting: Internal Medicine

## 2010-11-21 NOTE — Telephone Encounter (Signed)
Pt returning call to [facility] from yesterday to discus blood work. Please return pt call to discuss further.

## 2010-11-21 NOTE — Telephone Encounter (Signed)
Returned call to patient and let him know I have sent his blood over to Dr Venita Sheffield office

## 2010-11-26 ENCOUNTER — Encounter (HOSPITAL_COMMUNITY): Payer: Self-pay | Admitting: Pharmacy Technician

## 2010-11-26 MED ORDER — CEFAZOLIN SODIUM-DEXTROSE 2-3 GM-% IV SOLR
2.0000 g | INTRAVENOUS | Status: DC
  Filled 2010-11-26: qty 50

## 2010-11-26 MED ORDER — MUPIROCIN 2 % EX OINT
TOPICAL_OINTMENT | Freq: Once | CUTANEOUS | Status: AC
  Administered 2010-11-27: 1 via NASAL
  Filled 2010-11-26: qty 22

## 2010-11-26 MED ORDER — SODIUM CHLORIDE 0.9 % IV SOLN: INTRAVENOUS | Status: DC

## 2010-11-26 MED ORDER — SODIUM CHLORIDE 0.9 % IV SOLN
INTRAVENOUS | Status: DC
  Administered 2010-11-27: 11:00:00 via INTRAVENOUS

## 2010-11-26 MED ORDER — CHLORHEXIDINE GLUCONATE 4 % EX LIQD
60.0000 mL | Freq: Once | CUTANEOUS | Status: DC
  Filled 2010-11-26: qty 118

## 2010-11-26 MED ORDER — SODIUM CHLORIDE 0.9 % IR SOLN
Status: DC
  Filled 2010-11-26: qty 2

## 2010-11-27 ENCOUNTER — Encounter (HOSPITAL_COMMUNITY)
Admission: RE | Disposition: A | Discharge status: Home / Self Care | Payer: Self-pay | Source: Ambulatory Visit | Attending: Internal Medicine

## 2010-11-27 ENCOUNTER — Ambulatory Visit (HOSPITAL_COMMUNITY)
Admission: RE | Admit: 2010-11-27 | Discharge: 2010-11-28 | Disposition: A | Discharge status: Home / Self Care | Payer: Medicare Other | Source: Ambulatory Visit | Attending: Internal Medicine | Admitting: Internal Medicine

## 2010-11-27 ENCOUNTER — Encounter (HOSPITAL_COMMUNITY): Payer: Self-pay | Admitting: General Practice

## 2010-11-27 DIAGNOSIS — T827XXA Infection and inflammatory reaction due to other cardiac and vascular devices, implants and grafts, initial encounter: Secondary | ICD-10-CM

## 2010-11-27 DIAGNOSIS — I447 Left bundle-branch block, unspecified: Secondary | ICD-10-CM

## 2010-11-27 DIAGNOSIS — J449 Chronic obstructive pulmonary disease, unspecified: Secondary | ICD-10-CM

## 2010-11-27 DIAGNOSIS — I5032 Chronic diastolic (congestive) heart failure: Secondary | ICD-10-CM

## 2010-11-27 DIAGNOSIS — I6789 Other cerebrovascular disease: Secondary | ICD-10-CM

## 2010-11-27 DIAGNOSIS — J438 Other emphysema: Secondary | ICD-10-CM

## 2010-11-27 DIAGNOSIS — Z23 Encounter for immunization: Secondary | ICD-10-CM | POA: Insufficient documentation

## 2010-11-27 DIAGNOSIS — I509 Heart failure, unspecified: Secondary | ICD-10-CM | POA: Insufficient documentation

## 2010-11-27 DIAGNOSIS — E785 Hyperlipidemia, unspecified: Secondary | ICD-10-CM

## 2010-11-27 DIAGNOSIS — I2589 Other forms of chronic ischemic heart disease: Secondary | ICD-10-CM | POA: Insufficient documentation

## 2010-11-27 DIAGNOSIS — Z9981 Dependence on supplemental oxygen: Secondary | ICD-10-CM | POA: Insufficient documentation

## 2010-11-27 DIAGNOSIS — Z95810 Presence of automatic (implantable) cardiac defibrillator: Secondary | ICD-10-CM

## 2010-11-27 DIAGNOSIS — J45909 Unspecified asthma, uncomplicated: Secondary | ICD-10-CM

## 2010-11-27 DIAGNOSIS — I429 Cardiomyopathy, unspecified: Secondary | ICD-10-CM

## 2010-11-27 DIAGNOSIS — Z8673 Personal history of transient ischemic attack (TIA), and cerebral infarction without residual deficits: Secondary | ICD-10-CM | POA: Insufficient documentation

## 2010-11-27 DIAGNOSIS — J4489 Other specified chronic obstructive pulmonary disease: Secondary | ICD-10-CM

## 2010-11-27 DIAGNOSIS — J9610 Chronic respiratory failure, unspecified whether with hypoxia or hypercapnia: Secondary | ICD-10-CM

## 2010-11-27 DIAGNOSIS — I5022 Chronic systolic (congestive) heart failure: Secondary | ICD-10-CM

## 2010-11-27 HISTORY — DX: Peptic ulcer, site unspecified, unspecified as acute or chronic, without hemorrhage or perforation: K27.9

## 2010-11-27 HISTORY — DX: Acute myocardial infarction, unspecified: I21.9

## 2010-11-27 HISTORY — PX: BI-VENTRICULAR PACEMAKER INSERTION: SHX5462

## 2010-11-27 HISTORY — PX: CARDIAC DEFIBRILLATOR PLACEMENT: SHX171

## 2010-11-27 HISTORY — DX: Gastrointestinal hemorrhage, unspecified: K92.2

## 2010-11-27 HISTORY — DX: Shortness of breath: R06.02

## 2010-11-27 LAB — PROTIME-INR
INR: 1.48 (ref 0.00–1.49)
Prothrombin Time: 18.2 seconds — ABNORMAL HIGH (ref 11.6–15.2)

## 2010-11-27 LAB — GLUCOSE, CAPILLARY
Glucose-Capillary: 200 mg/dL — ABNORMAL HIGH (ref 70–99)
Glucose-Capillary: 149 mg/dL — ABNORMAL HIGH (ref 70–99)

## 2010-11-27 SURGERY — BI-VENTRICULAR PACEMAKER INSERTION (CRT-P)
Anesthesia: LOCAL

## 2010-11-27 MED ORDER — ACETAMINOPHEN 325 MG PO TABS
325.0000 mg | ORAL_TABLET | ORAL | Status: DC | PRN
  Filled 2010-11-27: qty 2

## 2010-11-27 MED ORDER — FENTANYL CITRATE 0.05 MG/ML IJ SOLN
INTRAMUSCULAR | Status: AC
  Administered 2010-11-27: 14:00:00 via INTRAVENOUS
  Filled 2010-11-27: qty 2

## 2010-11-27 MED ORDER — TAMSULOSIN HCL 0.4 MG PO CAPS
0.4000 mg | ORAL_CAPSULE | Freq: Every day | ORAL | Status: DC
  Administered 2010-11-27 – 2010-11-28 (×2): 0.4 mg via ORAL
  Filled 2010-11-27 (×2): qty 1

## 2010-11-27 MED ORDER — LIDOCAINE HCL (PF) 1 % IJ SOLN
INTRAMUSCULAR | Status: AC
  Filled 2010-11-27: qty 60

## 2010-11-27 MED ORDER — ACETAMINOPHEN 500 MG PO TABS
1000.0000 mg | ORAL_TABLET | Freq: Four times a day (QID) | ORAL | Status: AC
  Administered 2010-11-27 – 2010-11-28 (×4): 1000 mg via ORAL
  Filled 2010-11-27 (×6): qty 2

## 2010-11-27 MED ORDER — BISACODYL 5 MG PO TBEC
5.0000 mg | DELAYED_RELEASE_TABLET | Freq: Every day | ORAL | Status: DC
  Administered 2010-11-27 – 2010-11-28 (×2): 5 mg via ORAL
  Filled 2010-11-27 (×2): qty 1

## 2010-11-27 MED ORDER — FUROSEMIDE 40 MG PO TABS
40.0000 mg | ORAL_TABLET | Freq: Every day | ORAL | Status: DC
  Administered 2010-11-27 – 2010-11-28 (×2): 40 mg via ORAL
  Filled 2010-11-27 (×2): qty 1

## 2010-11-27 MED ORDER — MUPIROCIN 2 % EX OINT
TOPICAL_OINTMENT | CUTANEOUS | Status: AC
  Filled 2010-11-27: qty 22

## 2010-11-27 MED ORDER — HEPARIN (PORCINE) IN NACL 2-0.9 UNIT/ML-% IJ SOLN
INTRAMUSCULAR | Status: AC
  Administered 2010-11-27: 13:00:00
  Filled 2010-11-27: qty 1000

## 2010-11-27 MED ORDER — DORZOLAMIDE HCL 2 % OP SOLN
1.0000 [drp] | Freq: Two times a day (BID) | OPHTHALMIC | Status: DC
  Administered 2010-11-27 – 2010-11-28 (×2): 1 [drp] via OPHTHALMIC
  Filled 2010-11-27: qty 10

## 2010-11-27 MED ORDER — CEFAZOLIN SODIUM 1-5 GM-% IV SOLN
INTRAVENOUS | Status: AC
  Administered 2010-11-27: 2 g via INTRAVENOUS
  Filled 2010-11-27: qty 100

## 2010-11-27 MED ORDER — INFLUENZA VIRUS VACC SPLIT PF IM SUSP
0.5000 mL | INTRAMUSCULAR | Status: AC
  Administered 2010-11-28: 0.5 mL via INTRAMUSCULAR
  Filled 2010-11-27 (×2): qty 0.5

## 2010-11-27 MED ORDER — SIMVASTATIN 20 MG PO TABS
20.0000 mg | ORAL_TABLET | Freq: Every day | ORAL | Status: DC
  Administered 2010-11-27: 20 mg via ORAL
  Filled 2010-11-27 (×2): qty 1

## 2010-11-27 MED ORDER — MIDAZOLAM HCL 5 MG/5ML IJ SOLN
INTRAMUSCULAR | Status: AC
  Administered 2010-11-27: 15:00:00
  Filled 2010-11-27: qty 5

## 2010-11-27 MED ORDER — ENALAPRIL MALEATE 10 MG PO TABS
10.0000 mg | ORAL_TABLET | Freq: Every day | ORAL | Status: DC
  Administered 2010-11-27 – 2010-11-28 (×2): 10 mg via ORAL
  Filled 2010-11-27 (×2): qty 1

## 2010-11-27 MED ORDER — SODIUM CHLORIDE 0.9 % IV SOLN: 250.0000 mL | INTRAVENOUS | Status: DC

## 2010-11-27 MED ORDER — FAMOTIDINE 20 MG PO TABS
20.0000 mg | ORAL_TABLET | Freq: Every day | ORAL | Status: DC
  Administered 2010-11-27: 20 mg via ORAL
  Filled 2010-11-27 (×2): qty 1

## 2010-11-27 MED ORDER — SODIUM CHLORIDE 0.9 % IJ SOLN
3.0000 mL | Freq: Two times a day (BID) | INTRAMUSCULAR | Status: DC
  Administered 2010-11-28: 3 mL via INTRAVENOUS

## 2010-11-27 MED ORDER — METOPROLOL SUCCINATE ER 50 MG PO TB24
50.0000 mg | ORAL_TABLET | Freq: Every day | ORAL | Status: DC
  Administered 2010-11-27 – 2010-11-28 (×2): 50 mg via ORAL
  Filled 2010-11-27 (×2): qty 1

## 2010-11-27 MED ORDER — MIDAZOLAM HCL 5 MG/5ML IJ SOLN
INTRAMUSCULAR | Status: AC
  Administered 2010-11-27: 5 mg via INTRAVENOUS
  Filled 2010-11-27: qty 5

## 2010-11-27 MED ORDER — FLUTICASONE-SALMETEROL 250-50 MCG/DOSE IN AEPB
1.0000 | INHALATION_SPRAY | Freq: Two times a day (BID) | RESPIRATORY_TRACT | Status: DC
  Administered 2010-11-27 – 2010-11-28 (×2): 1 via RESPIRATORY_TRACT
  Filled 2010-11-27: qty 14

## 2010-11-27 MED ORDER — ONDANSETRON HCL 4 MG/2ML IJ SOLN: 4.0000 mg | Freq: Four times a day (QID) | INTRAMUSCULAR | Status: DC | PRN

## 2010-11-27 MED ORDER — GLIMEPIRIDE 2 MG PO TABS
2.0000 mg | ORAL_TABLET | Freq: Two times a day (BID) | ORAL | Status: DC
  Administered 2010-11-27 – 2010-11-28 (×2): 2 mg via ORAL
  Filled 2010-11-27 (×5): qty 1

## 2010-11-27 MED ORDER — WARFARIN SODIUM 5 MG PO TABS
5.0000 mg | ORAL_TABLET | Freq: Every day | ORAL | Status: DC
  Administered 2010-11-27 – 2010-11-28 (×2): 5 mg via ORAL
  Filled 2010-11-27 (×2): qty 1

## 2010-11-27 MED ORDER — SODIUM CHLORIDE 0.9 % IJ SOLN: 3.0000 mL | INTRAMUSCULAR | Status: DC | PRN

## 2010-11-27 NOTE — Op Note (Signed)
BiV PPM implanted via right subclavian vein. Full note dictated. Dictation # [id number] Lewayne Bunting 11/27/2010

## 2010-11-27 NOTE — Interval H&P Note (Signed)
History and Physical Interval Note:   11/27/2010   1:17 PM   Robert Davies  has presented today for surgery, with the diagnosis of heart failure   The various methods of treatment have been discussed with the patient and family. After consideration of risks, benefits and other options for treatment, the patient has consented to  Procedure(s): BI-VENTRICULAR PACEMAKER INSERTION (CRT-P) as a surgical intervention .  The patients' history has been reviewed, patient examined, no change in status, stable for surgery.  I have reviewed the patients' chart and labs.  Questions were answered to the patient's satisfaction.     Lewayne Bunting  MD    Date of Initial H&P: 11/22/2010  History reviewed, patient examined, no change in status, stable for surgery. Lewayne Bunting 11/27/2010

## 2010-11-27 NOTE — Op Note (Signed)
NAME:  SUMEDH, SHINSATO NO.:  [id number]  MEDICAL RECORD NO.:  [id number]  LOCATION:  [facility]                         FACILITY:  [facility]  PHYSICIAN:  Doylene Canning. Ladona Ridgel, MD    DATE OF BIRTH:  07/31/1929  DATE OF PROCEDURE:  11/27/2010 DATE OF DISCHARGE:                              OPERATIVE REPORT   PROCEDURE PERFORMED:  Insertion of a biventricular pacemaker.  INDICATION:  Chronic systolic heart failure, EF 25%, left bundle-branch block despite maximal medical therapy.  INTRODUCTION:  The patient is a 77 year old man who has a prior ischemic cardiomyopathy and congestive heart failure and left bundle-branch block.  The patient subsequently developed a device infection in his left chest and had extraction of the BiV ICD.  He is now referred for BiV pacemaker system.  PROCEDURE:  After informed consent was obtained, the patient was taken to the diagnostic EP lab in a fasting state.  After usual preparation and draping, intravenous fentanyl and midazolam was given for sedation. 30 mL of lidocaine was infiltrated into the right infraclavicular region.  A 7-cm incision was carried out over this region. Electrocautery was utilized to dissect down to the fascia plane.  The right subclavian vein was punctured x3.  The St. Jude model 2088 T 52 cm active fixation pacing lead serial number [id number] was advanced into the right ventricle and the St. Jude model 2088 T 46 cm active fixation pacing lead, serial number [id number] was advanced to the right atrium. Mapping was carried out in the right ventricle.  At the final site, the R-waves were 15 mV, the pacing impedance was 700 ohms, and threshold 0.5 V at 0.4 msec.  10-volt pacing did not stimulate the diaphragm.  With the ventricular lead in a satisfactory position, attention was then turned to placing the atrial lead, it was placed in the anterolateral portion of the right atrium where P-waves measured 4 mV, the  pacing impedance was 580 ohms and a threshold was less than 2 V at 0.4 msec. Again, 10-volt pacing did not stimulate the diaphragm.  With the ventricular lead and the atrial lead in satisfactory position, attention was then turned to placement of the left ventricular lead.  The coronary sinus was cannulated with a 6-French hexapolar EP catheter.  Venography was carried out demonstrating a posterior vein.  Attempts to get beyond this posterior vein were unsuccessful.  The distal portion of the coronary sinus could not be cannulated.  This venography however demonstrated satisfactory size vein.  The St. Jude 1258 Quick Flex 86 cm bipolar pacing lead was advanced through the EP guiding catheter and into the posterior vein.  The threshold was 2 V at 0.8 msec and the pacing impedance was around 700 ohms.  In the unipolar mode 5.0 pacing did not stimulate the diaphragm.  With the LV lead in a satisfactory position, it was removed from the guiding catheter in usual fashion. Silk suture was then utilized to secure the leads to the subpectoral fascia.  The sewing sleeves were also secured with silk suture. Electrocautery was utilized to make subcutaneous pocket and electrocautery was utilized to assure hemostasis.  The St. Jude Anthem RF BiV pacemaker,  serial number [id number] was connected to the atrial RV and LV leads and placed back in the subcutaneous pocket.  The pocket was irrigated with antibiotic irrigation and the incision was closed with 2- 0 and 3-0 Vicryl.  Benzoin and Steri-Strips were painted on the skin, pressure dressing was applied, and the patient was returned to his room in a satisfactory condition.  COMPLICATIONS:  There were are no immediate procedure complications.  RESULTS:  This demonstrates successful implantation of a St. Jude BiV pacemaker in a patient with chronic class III congestive heart failure, left bundle-branch block despite maximal medical therapy and  ischemic cardiomyopathy, EF 30%.     Doylene Canning. Ladona Ridgel, MD     GWT/MEDQ  D:  11/27/2010  T:  11/27/2010  Job:  [id number]

## 2010-11-27 NOTE — H&P (View-Only) (Signed)
HPI [name] returns today for followup. He is a very pleasant 77 year old man with a history of a nonischemic cardiomyopathy, chronic systolic heart failure ejection fraction 15%, status post BIV ICD insertion. The patient develop a chronic infected draining sinus in his ICD insertion site and underwent extraction of his entire system approximately 2 months ago. The procedure was complicated by a hematoma which has subsequently resolved. He denies fevers or chills. Overall he his only complaint is that of worsening fatigue and dyspnea with exertion. His heart failure which initially gone from class II is now class III. The patient's ejection fraction had initially improved after biventricular ICD implant. Now he is no longer has any BiV pacing. The patient denies syncope or chest pain. He denies peripheral edema. Allergies  Allergen Reactions  . Adhesive (Tape)      Current Outpatient Prescriptions  Medication Sig Dispense Refill  . bisacodyl (DULCOLAX) 5 MG EC tablet Take 5 mg by mouth daily.        . dorzolamide (TRUSOPT) 2 % ophthalmic solution Place 1 drop into both eyes 2 (two) times daily.        . enalapril (VASOTEC) 10 MG tablet Take 10 mg by mouth daily.        . famotidine (PEPCID) 20 MG tablet Take 20 mg by mouth at bedtime.        . Fluticasone-Salmeterol (ADVAIR DISKUS) 250-50 MCG/DOSE AEPB Inhale 1 puff into the lungs 2 (two) times daily.        . furosemide (LASIX) 40 MG tablet Take 40 mg by mouth daily.        Marland Kitchen glimepiride (AMARYL) 2 MG tablet Take 2 mg by mouth 2 (two) times daily.        . iron polysaccharides (NIFEREX) 150 MG capsule Take 150 mg by mouth 2 (two) times daily.       . metoprolol (TOPROL-XL) 50 MG 24 hr tablet Take 50 mg by mouth daily.        . NON FORMULARY Oxygen       . potassium chloride (KLOR-CON) 10 MEQ CR tablet Take 10 mEq by mouth 2 (two) times daily.        . simvastatin (ZOCOR) 20 MG tablet Take 20 mg by mouth at bedtime.        . Tamsulosin HCl  (FLOMAX) 0.4 MG CAPS Take 0.4 mg by mouth daily.        Marland Kitchen warfarin (COUMADIN) 5 MG tablet Take 5 mg by mouth as directed.           Past Medical History  Diagnosis Date  . Emphysema     Oxygen-dependent  . Stroke   . Chronic systolic heart failure   . Implantable cardiac defibrillator infection     Drainage identified July 2012  . ICD (implantable cardiac defibrillator) in place     CRT St Jude; remote - yes  . Cardiomyopathy secondary     Catheterization 2005 no obstructive coronary disease  . HLD (hyperlipidemia)     ROS:   All systems reviewed and negative except as noted in the HPI.   Past Surgical History  Procedure Date  . Cervical discectomy   . Tonsillectomy   . Adenoidectomy   . Pacemaker insertion     x2  . Doppler echocardiography 2005, 2011     Family History  Problem Relation Age of Onset  . Cancer Father   . Cancer Mother   . Cancer Daughter  History   Social History  . Marital Status: Widowed    Spouse Name: N/A    Number of Children: N/A  . Years of Education: N/A   Occupational History  . Not on file.   Social History Main Topics  . Smoking status: Former Games developer  . Smokeless tobacco: Not on file   Comment: started at age 40; smoked 1-2.5 ppd; quit 11/11  . Alcohol Use: No  . Drug Use: Not on file  . Sexually Active: Not on file   Other Topics Concern  . Not on file   Social History Narrative   Divorced, retired Naval architect.      BP 124/70  Pulse 73  Ht 5\' 6"  (1.676 m)  Wt 204 lb (92.534 kg)  BMI 32.93 kg/m2  Physical Exam:  Well appearing NAD HEENT: Unremarkable Neck:  No JVD, no thyromegally Lymphatics:  No adenopathy Back:  No CVA tenderness Lungs:  Clear with no wheezes, rhonchi or increased work of breathing. Scattered basilar rales are present bilaterally. His old ICD insertion site is healing well. HEART:  Regular rate rhythm, no murmurs, no rubs, no clicks. A soft S3 gallop is present and S2 is  split Abd:  soft, positive bowel sounds, no organomegally, no rebound, no guarding Ext:  2 plus pulses, no edema, no cyanosis, no clubbing Skin:  No rashes no nodules Neuro:  CN II through XII intact, motor grossly intact  EKG Normal sinus rhythm with left bundle branch block.  Assess/Plan:

## 2010-11-28 ENCOUNTER — Other Ambulatory Visit: Payer: Self-pay

## 2010-11-28 ENCOUNTER — Ambulatory Visit (HOSPITAL_COMMUNITY): Payer: Medicare Other

## 2010-11-28 NOTE — Progress Notes (Signed)
Subjective:  S/p BiV PPM insertion. No c/p or sob.  Objective:  Vital Signs in the last 24 hours: Temp:  [96.5 F (35.8 C)-98.1 F (36.7 C)] 97.8 F (36.6 C) (11/08 0600) Pulse Rate:  [66-87] 69  (11/08 0600) Resp:  [16-24] 20  (11/08 0600) BP: (100-135)/(55-86) 102/62 mmHg (11/08 0436) SpO2:  [94 %-98 %] 97 % (11/08 0600) Weight:  [205 lb (92.987 kg)] 205 lb (92.987 kg) (11/07 1034)  Intake/Output from previous day: 11/07 0701 - 11/08 0700 In: -  Out: 425 [Urine:425] Intake/Output from this shift:    Physical Exam: Well appearing NAD  HEENT: Unremarkable Neck:  No JVD, no thyromegally Lymphatics:  No adenopathy Back:  No CVA tenderness Lungs:  Clear with no wheezes, rales, or rhonchi HEART:  Regular rate rhythm, no murmurs, no rubs, no clicks Abd:  Flat, positive bowel sounds, no organomegally, no rebound, no guarding Ext:  2 plus pulses, no edema, no cyanosis, no clubbing Skin:  No rashes no nodules Neuro:  CN II through XII intact, motor grossly intact   Lab Results: No results found for this basename: WBC:2,HGB:2,PLT:2 in the last 72 hours No results found for this basename: NA:2,K:2,CL:2,CO2:2,GLUCOSE:2,BUN:2,CREATININE:2 in the last 72 hours No results found for this basename: TROPONINI:2,CK,MB:2 in the last 72 hours Hepatic Function Panel No results found for this basename: PROT,ALBUMIN,AST,ALT,ALKPHOS,BILITOT,BILIDIR,IBILI in the last 72 hours No results found for this basename: CHOL in the last 72 hours No results found for this basename: PROTIME in the last 72 hours  Cardiac Studies: Tele - NSR. CXR pending  Assessment/Plan:  Chronic systolic CHF - s/p BiV PPM insertion. Will check CXR, then discharge home on same meds and plan usual followup.   LOS: 1 day    Lewayne Bunting 11/28/2010, 7:45 AM

## 2010-11-28 NOTE — Plan of Care (Signed)
Problem: Phase III Progression Outcomes Goal: Limited arm movement per orders Outcome: Completed/Met Date Met:  11/28/10 Pt arm in sling

## 2010-11-28 NOTE — Discharge Summary (Signed)
Physician Discharge Summary  Patient ID: Robert Davies,  MRN: [id number], DOB/AGE: 75/11/1929 75 y.o.  Admit date: 11/27/2010 Discharge date: 11/28/2010  Primary Discharge Diagnosis:  1. NICM- s/p CRT-P implantation this admission  Secondary Discharge Diagnosis:  1. Previous ICD infection requiring explantation 2. Emphysema- O2 dependent 3. History of stroke 4. Hyperlipidemia  PROCEDURES THIS ADMISSION:  BI-VENTRICULAR PACEMAKER INSERTION (CRT-P) - the patient received a STJ CRTP implanted on the right side.  He had no early apparent complications. 2. CXR on 11-28-10 demonstrated no pneumothorax status post device implant  Brief HPI: Robert Davies is a 77 year old male with a history of NICM s/p CRTD implantation in December of 2005 that subsequently developed a chronic draining sinus.  Because of this, he underwent device explantation in August of 2012.  After explant, the patient complained of worsening fatigue and dyspnea on exertion.  He was evaluated by Robert. Ladona Davies in the outpatient setting for device reimplantation.  CRT-P implant was recommended.  Risks, benefits, and alternatives of the procedure were reviewed with the patient and he wished to proceed.   Hospital Course: The patient was admitted on 11-27-10 for planned implantation of CRT-P.  This was carried out by Robert Davies with details as outlined above.  He was monitored on telemetry overnight which demonstrated sinus rhythm with Bi-V pacing.  His right chest was without hematoma or ecchymosis.  His device was interrogated and found to be functioning normally.  CXR was obtained which demonstrated no pneumothorax.  Robert. Ladona Davies examined the patient and considered him stable for discharge.  Discharge Vitals: Blood pressure 115/73, pulse 70, temperature 97.8 F (36.6 C), temperature source Oral, resp. rate 20, height 5\' 7"  (1.702 m), weight 92.987 kg (205 lb), SpO2 96.00%.     Discharge Medications:  Current Discharge Medication List      CONTINUE these medications which have NOT CHANGED   Details  bisacodyl (DULCOLAX) 5 MG EC tablet Take 5 mg by mouth daily.      dorzolamide (TRUSOPT) 2 % ophthalmic solution Place 1 drop into both eyes 2 (two) times daily.      enalapril (VASOTEC) 10 MG tablet Take 10 mg by mouth daily.     Associated Diagnoses: Chronic systolic heart failure    famotidine (PEPCID) 20 MG tablet Take 20 mg by mouth at bedtime.      Fluticasone-Salmeterol (ADVAIR DISKUS) 250-50 MCG/DOSE AEPB Inhale 1 puff into the lungs 2 (two) times daily.      furosemide (LASIX) 40 MG tablet Take 40 mg by mouth daily.      glimepiride (AMARYL) 2 MG tablet Take 2 mg by mouth 2 (two) times daily.      iron polysaccharides (NIFEREX) 150 MG capsule Take 150 mg by mouth 2 (two) times daily.     metoprolol (TOPROL-XL) 50 MG 24 hr tablet Take 50 mg by mouth daily.      potassium chloride (KLOR-CON) 10 MEQ CR tablet Take 10 mEq by mouth 2 (two) times daily.      simvastatin (ZOCOR) 20 MG tablet Take 20 mg by mouth at bedtime.      Tamsulosin HCl (FLOMAX) 0.4 MG CAPS Take 0.4 mg by mouth daily.      warfarin (COUMADIN) 5 MG tablet Take 5 mg by mouth daily.         Disposition: Robert. Ladona Davies examined the patient and considered him stable for discharge.   Discharge Orders    Future Appointments: Provider: Department: Dept Phone: Center:  12/09/2010 3:30 PM Robert Davies [facility] [phone] [facility]     Future Orders Please Complete By Expires   Diet - low sodium heart healthy      Increase activity slowly      Comments:   See attached sheet for specific instructions on activity.   Driving Restrictions      Comments:   No driving for 1 week   Lifting restrictions      Comments:   No more than 10 lbs for 4 weeks   Discharge wound care:      Comments:   Keep incision clean and dry for 1 week       Duration of Discharge Encounter: Greater than 30 minutes including physician  time.  Signed, Robert Balsam, RN, BSN 11/28/2010, 11:14 AM

## 2010-11-29 ENCOUNTER — Encounter (HOSPITAL_COMMUNITY): Payer: Self-pay

## 2010-12-09 ENCOUNTER — Ambulatory Visit (INDEPENDENT_AMBULATORY_CARE_PROVIDER_SITE_OTHER): Payer: Medicare Other | Admitting: *Deleted

## 2010-12-09 DIAGNOSIS — I428 Other cardiomyopathies: Secondary | ICD-10-CM

## 2010-12-09 LAB — PACEMAKER DEVICE OBSERVATION
AL AMPLITUDE: 3.3 mv
AL IMPEDENCE PM: 462.5 Ohm
AL THRESHOLD: 0.5 V
BAMS-0003: 70 {beats}/min
DEVICE MODEL PM: 2699993
RV LEAD AMPLITUDE: 10.8 mv
RV LEAD IMPEDENCE PM: 525 Ohm
RV LEAD THRESHOLD: 0.625 V

## 2011-01-09 ENCOUNTER — Encounter: Payer: Self-pay | Admitting: Internal Medicine

## 2011-03-17 ENCOUNTER — Encounter: Payer: Self-pay | Admitting: Internal Medicine

## 2011-03-17 ENCOUNTER — Ambulatory Visit (INDEPENDENT_AMBULATORY_CARE_PROVIDER_SITE_OTHER): Payer: Medicare Other | Admitting: Internal Medicine

## 2011-03-17 DIAGNOSIS — Z95810 Presence of automatic (implantable) cardiac defibrillator: Secondary | ICD-10-CM

## 2011-03-17 DIAGNOSIS — I429 Cardiomyopathy, unspecified: Secondary | ICD-10-CM

## 2011-03-17 DIAGNOSIS — I5022 Chronic systolic (congestive) heart failure: Secondary | ICD-10-CM

## 2011-03-17 LAB — PACEMAKER DEVICE OBSERVATION
AL THRESHOLD: 0.75 V
ATRIAL PACING PM: 38
BAMS-0003: 70 {beats}/min
BATTERY VOLTAGE: 2.93 V
LV LEAD IMPEDENCE PM: 690 Ohm
LV LEAD THRESHOLD: 2.25 V
RV LEAD THRESHOLD: 0.625 V
VENTRICULAR PACING PM: 96

## 2011-03-17 NOTE — Progress Notes (Signed)
HPI Robert Davies returns today for followup. He is a 78 year old man with a chronic systolic heart failure status post ICD implantation. He underwent ICD extraction after developing a chronic pocket infection. He underwent reinsertion of a biventricular pacemaker following lead extraction. From an arrhythmia perspective, he has been stable since his pacemaker insertion. He denies syncope or sustained palpitations. No chest pain. He has chronic class II dyspnea which is multifactorial. He uses oxygen for supplementation. He denies syncope or near-syncope. Allergies  Allergen Reactions  . Adhesive (Tape)      Current Outpatient Prescriptions  Medication Sig Dispense Refill  . albuterol (ACCUNEB) 1.25 MG/3ML nebulizer solution Take 1 ampule by nebulization 2 (two) times daily.       . dorzolamide (TRUSOPT) 2 % ophthalmic solution Place 1 drop into both eyes 2 (two) times daily.        . Dorzolamide HCl-Timolol Mal (COSOPT OP) Apply 1 drop to eye 2 (two) times daily.        . enalapril (VASOTEC) 10 MG tablet Take 10 mg by mouth daily.        . famotidine (PEPCID) 20 MG tablet Take 20 mg by mouth at bedtime.        . Fluticasone-Salmeterol (ADVAIR DISKUS) 250-50 MCG/DOSE AEPB Inhale 1 puff into the lungs 2 (two) times daily.        . furosemide (LASIX) 40 MG tablet Take 40 mg by mouth daily.        Marland Kitchen glimepiride (AMARYL) 2 MG tablet Take 2 mg by mouth 2 (two) times daily.        . iron polysaccharides (NIFEREX) 150 MG capsule Take 150 mg by mouth 2 (two) times daily.       . metoprolol (TOPROL-XL) 50 MG 24 hr tablet Take 50 mg by mouth daily.        . potassium chloride (KLOR-CON) 10 MEQ CR tablet Take 10 mEq by mouth 2 (two) times daily.        . simvastatin (ZOCOR) 20 MG tablet Take 20 mg by mouth at bedtime.        . Tamsulosin HCl (FLOMAX) 0.4 MG CAPS Take 0.4 mg by mouth daily.        Marland Kitchen tiotropium (SPIRIVA) 18 MCG inhalation capsule Place 18 mcg into inhaler and inhale daily.      . traMADol  (ULTRAM) 50 MG tablet Take 50 mg by mouth every 6 (six) hours as needed. Maximum dose= 8 tablets per day       . warfarin (COUMADIN) 5 MG tablet Take 5 mg by mouth daily.          Past Medical History  Diagnosis Date  . Emphysema     Oxygen-dependent  . Chronic systolic heart failure   . Implantable cardiac defibrillator infection     Drainage identified July 2012  . ICD (implantable cardiac defibrillator) in place     CRT St Jude; remote - yes  . Cardiomyopathy secondary     Catheterization 2005 no obstructive coronary disease  . HLD (hyperlipidemia)   . Dysrhythmia   . Shortness of breath 11/27/10     "@ rest; sitting up; exerction"  . Hypertension   . Myocardial infarction 1990's  . Peptic ulcer     "bleeding ulcers"  . GI bleeding   . COPD (chronic obstructive pulmonary disease)   . Hyperlipidemia   . BPH (benign prostatic hypertrophy)   . DM (diabetes mellitus)     ROS:  All systems reviewed and negative except as noted in the HPI.   Past Surgical History  Procedure Date  . Adenoidectomy   . Doppler echocardiography 2005, 2011  . Cardiac defibrillator placement 11/27/10    "this is my 3rd defibrillator"  . Cervical disc surgery 1990's    "slipped"  . Laceration repair ~ 1939    right leg  . Tonsillectomy ~ 1940  . Insert / replace / remove pacemaker 11/27/10    "this is my third one"  . Eye surgery   . Cataract extraction     "left eye"  . Cardiac catheterization 12/01/2003     Family History  Problem Relation Age of Onset  . Cancer Father   . Cancer Mother     throat  . Lung cancer Daughter      History   Social History  . Marital Status: Widowed    Spouse Name: Robert Davies    Number of Children: Robert Davies  . Years of Education: Robert Davies   Occupational History  . Not on file.   Social History Main Topics  . Smoking status: Former Smoker -- 2.0 packs/day    Types: Cigarettes    Quit date: 06/27/2009  . Smokeless tobacco: Never Used   Comment: started  at age 69; smoked 1-2.5 ppd; quit 11/11  . Alcohol Use: No  . Drug Use: No  . Sexually Active: Not on file   Other Topics Concern  . Not on file   Social History Narrative   Divorced, retired [employer] architect.      BP 114/64  Pulse 56  Wt 95.255 kg (210 lb)  Physical Exam:  ill appearing, elderly man, NAD HEENT: Unremarkable Neck:  No JVD, no thyromegally Lymphatics:  No adenopathy Back:  No CVA tenderness Lungs:  Clear except for rales in the bases bilaterally. No wheezes or rhonchi. Well-healed pacemaker incision right chest. HEART:  Regular rate rhythm, no murmurs, no rubs, no clicks Abd:  soft, positive bowel sounds, no organomegally, no rebound, no guarding Ext:  2 plus pulses, no edema, no cyanosis, no clubbing Skin:  No rashes no nodules Neuro:  CN II through XII intact, motor grossly intact  DEVICE  Normal device function.  See PaceArt for details.   Assess/Plan:

## 2011-03-17 NOTE — Assessment & Plan Note (Signed)
His symptoms are between class II and class III. I have encouraged him to maintain a low-sodium diet and continue his medical therapy.

## 2011-03-17 NOTE — Patient Instructions (Signed)
Remote monitoring is used to monitor your Pacemaker of ICD from home. This monitoring reduces the number of office visits required to check your device to one time per year. It allows [place] to keep an eye on the functioning of your device to ensure it is working properly. You are scheduled for a device check from home on Jun 19, 2011. You may send your transmission at any time that day. If you have a wireless device, the transmission will be sent automatically. After your physician reviews your transmission, you will receive a postcard with your next transmission date.  Your physician wants you to follow-up in: 1 year with Dr Ladona Ridgel.  You will receive a reminder letter in the mail two months in advance. If you don't receive a letter, please call our office to schedule the follow-up appointment.

## 2011-03-17 NOTE — Assessment & Plan Note (Signed)
His device is working normally. Plan to recheck in several months. 

## 2011-04-22 ENCOUNTER — Other Ambulatory Visit: Payer: Self-pay | Admitting: Adult Health

## 2011-05-13 ENCOUNTER — Encounter: Payer: Self-pay | Admitting: Internal Medicine

## 2011-05-13 ENCOUNTER — Ambulatory Visit (INDEPENDENT_AMBULATORY_CARE_PROVIDER_SITE_OTHER): Payer: Medicare Other | Admitting: Internal Medicine

## 2011-05-13 VITALS — BP 110/70 | HR 68 | Ht 65.0 in | Wt 214.0 lb

## 2011-05-13 DIAGNOSIS — Z95810 Presence of automatic (implantable) cardiac defibrillator: Secondary | ICD-10-CM

## 2011-05-13 DIAGNOSIS — J9610 Chronic respiratory failure, unspecified whether with hypoxia or hypercapnia: Secondary | ICD-10-CM

## 2011-05-13 MED ORDER — CLINDAMYCIN HCL 300 MG PO CAPS: 300.0000 mg | ORAL_CAPSULE | Freq: Three times a day (TID) | ORAL | Status: AC

## 2011-05-13 NOTE — Patient Instructions (Signed)
Your physician recommends that you schedule a follow-up appointment in: 3 weeks with Dr Ladona Ridgel  Your physician has recommended you make the following change in your medication:  1) Start Clindamycin 300mg  one tablet three times daily

## 2011-05-13 NOTE — Assessment & Plan Note (Signed)
This is a multifactorial problem. He has COPD as well as chronic systolic heart failure. His symptoms are class III.

## 2011-05-13 NOTE — Progress Notes (Signed)
HPI Robert Davies returns today for an unscheduled visit. He is a 78 year old man with a history of an ischemic cardiomyopathy, chronic systolic heart failure, oxygen-dependent COPD, brittle diabetes, who underwent ICD system extraction several months ago. The patient underwent insertion of a new device on the contralateral side. He initially did well but over the last 2 weeks has noted swelling in his device pocket as well as discomfort. He denies fevers or chills. There has been no erythema at the site. No drainage. Allergies  Allergen Reactions  . Adhesive (Tape)      Current Outpatient Prescriptions  Medication Sig Dispense Refill  . albuterol (ACCUNEB) 1.25 MG/3ML nebulizer solution Take 1 ampule by nebulization 2 (two) times daily.       . dorzolamide (TRUSOPT) 2 % ophthalmic solution Place 1 drop into both eyes 2 (two) times daily.        . Dorzolamide HCl-Timolol Mal (COSOPT OP) Apply 1 drop to eye 2 (two) times daily.        . enalapril (VASOTEC) 10 MG tablet TAKE ONE (1) TABLET EACH DAY  30 tablet  6  . famotidine (PEPCID) 20 MG tablet Take 20 mg by mouth at bedtime.        . Fluticasone-Salmeterol (ADVAIR DISKUS) 250-50 MCG/DOSE AEPB Inhale 1 puff into the lungs 2 (two) times daily.        . furosemide (LASIX) 40 MG tablet Take 40 mg by mouth daily.        Marland Kitchen glimepiride (AMARYL) 2 MG tablet Take 2 mg by mouth 2 (two) times daily.        . iron polysaccharides (NIFEREX) 150 MG capsule Take 150 mg by mouth 2 (two) times daily.       . metoprolol (TOPROL-XL) 50 MG 24 hr tablet Take 50 mg by mouth daily.        . potassium chloride (KLOR-CON) 10 MEQ CR tablet Take 10 mEq by mouth 2 (two) times daily.        . simvastatin (ZOCOR) 20 MG tablet Take 20 mg by mouth at bedtime.        . Tamsulosin HCl (FLOMAX) 0.4 MG CAPS Take 0.4 mg by mouth daily.        Marland Kitchen tiotropium (SPIRIVA) 18 MCG inhalation capsule Place 18 mcg into inhaler and inhale daily.      . traMADol (ULTRAM) 50 MG tablet Take 50 mg  by mouth every 6 (six) hours as needed. Maximum dose= 8 tablets per day       . warfarin (COUMADIN) 5 MG tablet Take 5 mg by mouth daily.          Past Medical History  Diagnosis Date  . Emphysema     Oxygen-dependent  . Chronic systolic heart failure   . Implantable cardiac defibrillator infection     Drainage identified July 2012  . ICD (implantable cardiac defibrillator) in place     CRT St Jude; remote - yes  . Cardiomyopathy secondary     Catheterization 2005 no obstructive coronary disease  . HLD (hyperlipidemia)   . Dysrhythmia   . Shortness of breath 11/27/10     "@ rest; sitting up; exerction"  . Hypertension   . Myocardial infarction 1990's  . Peptic ulcer     "bleeding ulcers"  . GI bleeding   . COPD (chronic obstructive pulmonary disease)   . Hyperlipidemia   . BPH (benign prostatic hypertrophy)   . DM (diabetes mellitus)     ROS:  All systems reviewed and negative except as noted in the HPI.   Past Surgical History  Procedure Date  . Adenoidectomy   . Doppler echocardiography 2005, 2011  . Cardiac defibrillator placement 11/27/10    "this is my 3rd defibrillator"  . Cervical disc surgery 1990's    "slipped"  . Laceration repair ~ 1939    right leg  . Tonsillectomy ~ 1940  . Insert / replace / remove pacemaker 11/27/10    "this is my third one"  . Eye surgery   . Cataract extraction     "left eye"  . Cardiac catheterization 12/01/2003     Family History  Problem Relation Age of Onset  . Cancer Father   . Cancer Mother     throat  . Lung cancer Daughter      History   Social History  . Marital Status: Widowed    Spouse Name: N/A    Number of Children: N/A  . Years of Education: N/A   Occupational History  . Not on file.   Social History Main Topics  . Smoking status: Former Smoker -- 2.0 packs/day    Types: Cigarettes    Quit date: 06/27/2009  . Smokeless tobacco: Never Used   Comment: started at age 62; smoked 1-2.5 ppd; quit  11/11  . Alcohol Use: No  . Drug Use: No  . Sexually Active: Not on file   Other Topics Concern  . Not on file   Social History Narrative   Divorced, retired [employer] architect.      BP 110/70  Pulse 68  Ht 5\' 5"  (1.651 m)  Wt 214 lb (97.07 kg)  BMI 35.61 kg/m2  Physical Exam:  Chronically ill appearing NAD HEENT: Unremarkable Neck:  No JVD, no thyromegally Lymphatics:  No adenopathy Back:  No CVA tenderness Lungs:  Clear with decreased breath sounds throughout. Device demonstrates small area of fluctuance with no erythema. The wound is tender. HEART:  Regular rate rhythm, no murmurs, no rubs, no clicks Abd:  soft, positive bowel sounds, no organomegally, no rebound, no guarding Ext:  2 plus pulses, no edema, no cyanosis, no clubbing Skin:  No rashes no nodules Neuro:  CN II through XII intact, motor grossly intact  Assess/Plan:

## 2011-05-13 NOTE — Assessment & Plan Note (Signed)
I am concerned that the patient's developed a new device infection. We'll treat him with antibiotics for now. He notes that his diabetes has been poorly controlled. I have encouraged him to come to the emergency room if he develops fevers, and chills or drainage or erythema at the site of his implant.

## 2011-05-26 ENCOUNTER — Other Ambulatory Visit: Payer: Self-pay | Admitting: Adult Health

## 2011-06-06 ENCOUNTER — Encounter: Payer: Self-pay | Admitting: Internal Medicine

## 2011-06-06 ENCOUNTER — Ambulatory Visit (INDEPENDENT_AMBULATORY_CARE_PROVIDER_SITE_OTHER): Payer: Medicare Other | Admitting: Internal Medicine

## 2011-06-06 VITALS — BP 112/60 | HR 79 | Ht 65.0 in | Wt 211.0 lb

## 2011-06-06 DIAGNOSIS — I5022 Chronic systolic (congestive) heart failure: Secondary | ICD-10-CM

## 2011-06-06 DIAGNOSIS — Z95810 Presence of automatic (implantable) cardiac defibrillator: Secondary | ICD-10-CM

## 2011-06-06 DIAGNOSIS — J449 Chronic obstructive pulmonary disease, unspecified: Secondary | ICD-10-CM

## 2011-06-06 LAB — PACEMAKER DEVICE OBSERVATION
AL IMPEDENCE PM: 440 Ohm
BAMS-0003: 70 {beats}/min
BATTERY VOLTAGE: 2.93 V
LV LEAD IMPEDENCE PM: 750 Ohm
LV LEAD THRESHOLD: 2 V
RV LEAD THRESHOLD: 0.5 V
VENTRICULAR PACING PM: 95

## 2011-06-06 MED ORDER — FUROSEMIDE 40 MG PO TABS: ORAL_TABLET | ORAL | Status: DC

## 2011-06-06 MED ORDER — CLINDAMYCIN HCL 300 MG PO CAPS: 300.0000 mg | ORAL_CAPSULE | Freq: Three times a day (TID) | ORAL | Status: AC

## 2011-06-06 NOTE — Assessment & Plan Note (Signed)
His symptoms are class III. Today I have asked the patient to increase his Lasix from 40 mg a day to 40 mg twice a day. Low-sodium diet is requested as well as continued medical therapy.

## 2011-06-06 NOTE — Assessment & Plan Note (Signed)
His symptoms and exam are improved. We will try to manage him medically at all possible. I prescribed 10 additional days of antibiotic therapy.

## 2011-06-06 NOTE — Patient Instructions (Addendum)
Remote monitoring is used to monitor your Pacemaker of ICD from home. This monitoring reduces the number of office visits required to check your device to one time per year. It allows [place] to keep an eye on the functioning of your device to ensure it is working properly. You are scheduled for a device check from home on September 11, 2011. You may send your transmission at any time that day. If you have a wireless device, the transmission will be sent automatically. After your physician reviews your transmission, you will receive a postcard with your next transmission date.  Your physician wants you to follow-up in: 3 months  with Dr Ladona Ridgel.      Your physician has recommended you make the following change in your medication:  1) Increase Furosemide to 40mg  twice daily--take the 2nd dose around 2pm 2) Take another 10 days of antibiotics-- Clindamycin

## 2011-06-06 NOTE — Assessment & Plan Note (Signed)
He has chronic dyspnea. He will continue his bronchodilators.

## 2011-06-06 NOTE — Progress Notes (Signed)
HPI [name] returns today for followup. He is a 78 year old man with long-standing systolic heart failure, left bundle branch block, status post by the ICD implantation. The patient developed device lead infection several years ago and had to have extraction and underwent reimplantation (CRT-P) on the contralateral side. I saw him last just under a month ago and he had developed swelling at the site of his pacemaker pocket. He was placed on antibiotic therapy and improved. His main problem now shortness of breath. He is on 40 mg of Lasix daily and I'm concerned that this is not enough for him. Allergies  Allergen Reactions  . Adhesive (Tape)      Current Outpatient Prescriptions  Medication Sig Dispense Refill  . albuterol (ACCUNEB) 1.25 MG/3ML nebulizer solution Take 1 ampule by nebulization 2 (two) times daily.       . dorzolamide (TRUSOPT) 2 % ophthalmic solution Place 1 drop into both eyes 2 (two) times daily.        . Dorzolamide HCl-Timolol Mal (COSOPT OP) Apply 1 drop to eye 2 (two) times daily.        . enalapril (VASOTEC) 10 MG tablet TAKE ONE (1) TABLET EACH DAY  30 tablet  6  . famotidine (PEPCID) 20 MG tablet Take 20 mg by mouth at bedtime.        . Fluticasone-Salmeterol (ADVAIR DISKUS) 250-50 MCG/DOSE AEPB Inhale 1 puff into the lungs 2 (two) times daily.        . furosemide (LASIX) 40 MG tablet Take 40 mg by mouth daily.        Marland Kitchen glimepiride (AMARYL) 2 MG tablet Take 2 mg by mouth 2 (two) times daily.        . iron polysaccharides (NIFEREX) 150 MG capsule Take 150 mg by mouth 2 (two) times daily.       . metoprolol (TOPROL-XL) 50 MG 24 hr tablet Take 50 mg by mouth daily.        . potassium chloride (KLOR-CON) 10 MEQ CR tablet Take 10 mEq by mouth 2 (two) times daily.        . simvastatin (ZOCOR) 20 MG tablet Take 20 mg by mouth at bedtime.        . Tamsulosin HCl (FLOMAX) 0.4 MG CAPS Take 0.4 mg by mouth daily.        Marland Kitchen tiotropium (SPIRIVA) 18 MCG inhalation capsule Place  18 mcg into inhaler and inhale daily.      . traMADol (ULTRAM) 50 MG tablet Take 50 mg by mouth every 6 (six) hours as needed. Maximum dose= 8 tablets per day       . warfarin (COUMADIN) 5 MG tablet Take 5 mg by mouth daily.          Past Medical History  Diagnosis Date  . Emphysema     Oxygen-dependent  . Chronic systolic heart failure   . Implantable cardiac defibrillator infection     Drainage identified July 2012  . ICD (implantable cardiac defibrillator) in place     CRT St Jude; remote - yes  . Cardiomyopathy secondary     Catheterization 2005 no obstructive coronary disease  . HLD (hyperlipidemia)   . Dysrhythmia   . Shortness of breath 11/27/10     "@ rest; sitting up; exerction"  . Hypertension   . Myocardial infarction 1990's  . Peptic ulcer     "bleeding ulcers"  . GI bleeding   . COPD (chronic obstructive pulmonary disease)   . Hyperlipidemia   .  BPH (benign prostatic hypertrophy)   . DM (diabetes mellitus)     ROS:   All systems reviewed and negative except as noted in the HPI.   Past Surgical History  Procedure Date  . Adenoidectomy   . Doppler echocardiography 2005, 2011  . Cardiac defibrillator placement 11/27/10    "this is my 3rd defibrillator"  . Cervical disc surgery 1990's    "slipped"  . Laceration repair ~ 1939    right leg  . Tonsillectomy ~ 1940  . Insert / replace / remove pacemaker 11/27/10    "this is my third one"  . Eye surgery   . Cataract extraction     "left eye"  . Cardiac catheterization 12/01/2003     Family History  Problem Relation Age of Onset  . Cancer Father   . Cancer Mother     throat  . Lung cancer Daughter      History   Social History  . Marital Status: Widowed    Spouse Name: N/A    Number of Children: N/A  . Years of Education: N/A   Occupational History  . Not on file.   Social History Main Topics  . Smoking status: Former Smoker -- 2.0 packs/day    Types: Cigarettes    Quit date: 06/27/2009    . Smokeless tobacco: Never Used   Comment: started at age 46; smoked 1-2.5 ppd; quit 11/11  . Alcohol Use: No  . Drug Use: No  . Sexually Active: Not on file   Other Topics Concern  . Not on file   Social History Narrative   Divorced, retired [employer] architect.      BP 112/60  Pulse 79  Ht 5\' 5"  (1.651 m)  Wt 211 lb (95.709 kg)  BMI 35.11 kg/m2  SpO2 95%  Physical Exam:  ill appearing elderly man, NAD HEENT: Unremarkable Neck:  8 cm JVD, no thyromegally Lungs:  Clear with rales in the bases bilaterally. HEART:  Regular rate rhythm, no murmurs, no rubs, no clicks Abd:  soft, positive bowel sounds, no organomegally, no rebound, no guarding Ext:  2 plus pulses, no edema, no cyanosis, no clubbing Skin:  No rashes no nodules Neuro:  CN II through XII intact, motor grossly intact  DEVICE  Normal device function.  See PaceArt for details.   Assess/Plan:   HPI  Allergies  Allergen Reactions  . Adhesive (Tape)      Current Outpatient Prescriptions  Medication Sig Dispense Refill  . albuterol (ACCUNEB) 1.25 MG/3ML nebulizer solution Take 1 ampule by nebulization 2 (two) times daily.       . dorzolamide (TRUSOPT) 2 % ophthalmic solution Place 1 drop into both eyes 2 (two) times daily.        . Dorzolamide HCl-Timolol Mal (COSOPT OP) Apply 1 drop to eye 2 (two) times daily.        . enalapril (VASOTEC) 10 MG tablet TAKE ONE (1) TABLET EACH DAY  30 tablet  6  . famotidine (PEPCID) 20 MG tablet Take 20 mg by mouth at bedtime.        . Fluticasone-Salmeterol (ADVAIR DISKUS) 250-50 MCG/DOSE AEPB Inhale 1 puff into the lungs 2 (two) times daily.        . furosemide (LASIX) 40 MG tablet Take 40 mg by mouth daily.        Marland Kitchen glimepiride (AMARYL) 2 MG tablet Take 2 mg by mouth 2 (two) times daily.        Marland Kitchen iron  polysaccharides (NIFEREX) 150 MG capsule Take 150 mg by mouth 2 (two) times daily.       . metoprolol (TOPROL-XL) 50 MG 24 hr tablet Take 50 mg by mouth daily.        .  potassium chloride (KLOR-CON) 10 MEQ CR tablet Take 10 mEq by mouth 2 (two) times daily.        . simvastatin (ZOCOR) 20 MG tablet Take 20 mg by mouth at bedtime.        . Tamsulosin HCl (FLOMAX) 0.4 MG CAPS Take 0.4 mg by mouth daily.        Marland Kitchen tiotropium (SPIRIVA) 18 MCG inhalation capsule Place 18 mcg into inhaler and inhale daily.      . traMADol (ULTRAM) 50 MG tablet Take 50 mg by mouth every 6 (six) hours as needed. Maximum dose= 8 tablets per day       . warfarin (COUMADIN) 5 MG tablet Take 5 mg by mouth daily.          Past Medical History  Diagnosis Date  . Emphysema     Oxygen-dependent  . Chronic systolic heart failure   . Implantable cardiac defibrillator infection     Drainage identified July 2012  . ICD (implantable cardiac defibrillator) in place     CRT St Jude; remote - yes  . Cardiomyopathy secondary     Catheterization 2005 no obstructive coronary disease  . HLD (hyperlipidemia)   . Dysrhythmia   . Shortness of breath 11/27/10     "@ rest; sitting up; exerction"  . Hypertension   . Myocardial infarction 1990's  . Peptic ulcer     "bleeding ulcers"  . GI bleeding   . COPD (chronic obstructive pulmonary disease)   . Hyperlipidemia   . BPH (benign prostatic hypertrophy)   . DM (diabetes mellitus)     ROS:   All systems reviewed and negative except as noted in the HPI.   Past Surgical History  Procedure Date  . Adenoidectomy   . Doppler echocardiography 2005, 2011  . Cardiac defibrillator placement 11/27/10    "this is my 3rd defibrillator"  . Cervical disc surgery 1990's    "slipped"  . Laceration repair ~ 1939    right leg  . Tonsillectomy ~ 1940  . Insert / replace / remove pacemaker 11/27/10    "this is my third one"  . Eye surgery   . Cataract extraction     "left eye"  . Cardiac catheterization 12/01/2003     Family History  Problem Relation Age of Onset  . Cancer Father   . Cancer Mother     throat  . Lung cancer Daughter       History   Social History  . Marital Status: Widowed    Spouse Name: N/A    Number of Children: N/A  . Years of Education: N/A   Occupational History  . Not on file.   Social History Main Topics  . Smoking status: Former Smoker -- 2.0 packs/day    Types: Cigarettes    Quit date: 06/27/2009  . Smokeless tobacco: Never Used   Comment: started at age 84; smoked 1-2.5 ppd; quit 11/11  . Alcohol Use: No  . Drug Use: No  . Sexually Active: Not on file   Other Topics Concern  . Not on file   Social History Narrative   Divorced, retired [employer] architect.      BP 112/60  Pulse 79  Ht 5\' 5"  (1.651  m)  Wt 211 lb (95.709 kg)  BMI 35.11 kg/m2  SpO2 95%  Physical Exam:  Well appearing NAD HEENT: Unremarkable Neck:  No JVD, no thyromegally Lymphatics:  No adenopathy Back:  No CVA tenderness Lungs:  Clear HEART:  Regular rate rhythm, no murmurs, no rubs, no clicks Abd:  soft, positive bowel sounds, no organomegally, no rebound, no guarding Ext:  2 plus pulses, no edema, no cyanosis, no clubbing Skin:  No rashes no nodules Neuro:  CN II through XII intact, motor grossly intact  EKG  DEVICE  Normal device function.  See PaceArt for details.   Assess/Plan:

## 2011-06-06 NOTE — Assessment & Plan Note (Signed)
Biventricular pacemaker interrogation demonstrates normal function.

## 2011-08-11 ENCOUNTER — Emergency Department (HOSPITAL_COMMUNITY): Payer: Medicare Other

## 2011-08-11 ENCOUNTER — Emergency Department (HOSPITAL_COMMUNITY)
Admission: EM | Admit: 2011-08-11 | Discharge: 2011-08-11 | Disposition: A | Discharge status: Home / Self Care | Payer: Medicare Other | Attending: Emergency Medicine | Admitting: Emergency Medicine

## 2011-08-11 ENCOUNTER — Encounter (HOSPITAL_COMMUNITY): Payer: Self-pay | Admitting: Emergency Medicine

## 2011-08-11 DIAGNOSIS — R079 Chest pain, unspecified: Secondary | ICD-10-CM | POA: Insufficient documentation

## 2011-08-11 DIAGNOSIS — Z79899 Other long term (current) drug therapy: Secondary | ICD-10-CM | POA: Insufficient documentation

## 2011-08-11 DIAGNOSIS — I10 Essential (primary) hypertension: Secondary | ICD-10-CM | POA: Insufficient documentation

## 2011-08-11 DIAGNOSIS — Z87891 Personal history of nicotine dependence: Secondary | ICD-10-CM | POA: Insufficient documentation

## 2011-08-11 DIAGNOSIS — Z7901 Long term (current) use of anticoagulants: Secondary | ICD-10-CM | POA: Insufficient documentation

## 2011-08-11 DIAGNOSIS — E119 Type 2 diabetes mellitus without complications: Secondary | ICD-10-CM | POA: Insufficient documentation

## 2011-08-11 DIAGNOSIS — J438 Other emphysema: Secondary | ICD-10-CM | POA: Insufficient documentation

## 2011-08-11 DIAGNOSIS — Z95810 Presence of automatic (implantable) cardiac defibrillator: Secondary | ICD-10-CM | POA: Insufficient documentation

## 2011-08-11 DIAGNOSIS — I5022 Chronic systolic (congestive) heart failure: Secondary | ICD-10-CM | POA: Insufficient documentation

## 2011-08-11 LAB — CBC WITH DIFFERENTIAL/PLATELET
Basophils Absolute: 0.1 10*3/uL (ref 0.0–0.1)
Basophils Relative: 1 % (ref 0–1)
Eosinophils Absolute: 0.4 10*3/uL (ref 0.0–0.7)
Eosinophils Relative: 4 % (ref 0–5)
HCT: 40.9 % (ref 39.0–52.0)
Hemoglobin: 13.3 g/dL (ref 13.0–17.0)
Lymphocytes Relative: 21 % (ref 12–46)
Lymphs Abs: 2.3 10*3/uL (ref 0.7–4.0)
MCH: 29.2 pg (ref 26.0–34.0)
MCHC: 32.5 g/dL (ref 30.0–36.0)
MCV: 89.9 fL (ref 78.0–100.0)
Monocytes Absolute: 0.9 10*3/uL (ref 0.1–1.0)
Monocytes Relative: 8 % (ref 3–12)
Neutro Abs: 7.3 10*3/uL (ref 1.7–7.7)
Neutrophils Relative %: 66 % (ref 43–77)
Platelets: 205 10*3/uL (ref 150–400)
RBC: 4.55 MIL/uL (ref 4.22–5.81)
RDW: 15.5 % (ref 11.5–15.5)
WBC: 11 10*3/uL — ABNORMAL HIGH (ref 4.0–10.5)

## 2011-08-11 LAB — POCT I-STAT TROPONIN I
Troponin i, poc: 0 ng/mL (ref 0.00–0.08)
Troponin i, poc: 0 ng/mL (ref 0.00–0.08)

## 2011-08-11 LAB — COMPREHENSIVE METABOLIC PANEL
ALT: 31 U/L (ref 0–53)
AST: 25 U/L (ref 0–37)
Albumin: 4.1 g/dL (ref 3.5–5.2)
Alkaline Phosphatase: 53 U/L (ref 39–117)
BUN: 24 mg/dL — ABNORMAL HIGH (ref 6–23)
CO2: 30 mEq/L (ref 19–32)
Calcium: 9.7 mg/dL (ref 8.4–10.5)
Chloride: 102 mEq/L (ref 96–112)
Creatinine, Ser: 1.72 mg/dL — ABNORMAL HIGH (ref 0.50–1.35)
GFR calc Af Amer: 41 mL/min — ABNORMAL LOW (ref >90)
GFR calc non Af Amer: 36 mL/min — ABNORMAL LOW (ref >90)
Glucose, Bld: 240 mg/dL — ABNORMAL HIGH (ref 70–99)
Potassium: 5.2 mEq/L — ABNORMAL HIGH (ref 3.5–5.1)
Sodium: 139 mEq/L (ref 135–145)
Total Bilirubin: 0.3 mg/dL (ref 0.3–1.2)
Total Protein: 7.8 g/dL (ref 6.0–8.3)

## 2011-08-11 LAB — PROTIME-INR
INR: 0.94 (ref 0.00–1.49)
Prothrombin Time: 12.8 seconds (ref 11.6–15.2)

## 2011-08-11 LAB — TROPONIN I
Troponin I: <0.3 ng/mL (ref <0.30)
Troponin I: <0.3 ng/mL (ref <0.30)

## 2011-08-11 NOTE — ED Provider Notes (Signed)
Pt placed in CDU to await delta Troponin. He has been chest pain free since his arrival to the ED. Plan is he can be discharged with follow-up with his cardiologist if Delta Trop negative.  7:50 PM- Delta Trop is negative, patient really wants to go home. Pt dc to family member. Answered all questions.  Results for orders placed during the hospital encounter of 08/11/11  CBC WITH DIFFERENTIAL      Component Value Range   WBC 11.0 (*) 4.0 - 10.5 K/uL   RBC 4.55  4.22 - 5.81 MIL/uL   Hemoglobin 13.3  13.0 - 17.0 g/dL   HCT 78.2  95.6 - 21.3 %   MCV 89.9  78.0 - 100.0 fL   MCH 29.2  26.0 - 34.0 pg   MCHC 32.5  30.0 - 36.0 g/dL   RDW 08.6  57.8 - 46.9 %   Platelets 205  150 - 400 K/uL   Neutrophils Relative 66  43 - 77 %   Lymphocytes Relative 21  12 - 46 %   Monocytes Relative 8  3 - 12 %   Eosinophils Relative 4  0 - 5 %   Basophils Relative 1  0 - 1 %   Neutro Abs 7.3  1.7 - 7.7 K/uL   Lymphs Abs 2.3  0.7 - 4.0 K/uL   Monocytes Absolute 0.9  0.1 - 1.0 K/uL   Eosinophils Absolute 0.4  0.0 - 0.7 K/uL   Basophils Absolute 0.1  0.0 - 0.1 K/uL   Smear Review MORPHOLOGY UNREMARKABLE    COMPREHENSIVE METABOLIC PANEL      Component Value Range   Sodium 139  135 - 145 mEq/L   Potassium 5.2 (*) 3.5 - 5.1 mEq/L   Chloride 102  96 - 112 mEq/L   CO2 30  19 - 32 mEq/L   Glucose, Bld 240 (*) 70 - 99 mg/dL   BUN 24 (*) 6 - 23 mg/dL   Creatinine, Ser 6.29 (*) 0.50 - 1.35 mg/dL   Calcium 9.7  8.4 - 52.8 mg/dL   Total Protein 7.8  6.0 - 8.3 g/dL   Albumin 4.1  3.5 - 5.2 g/dL   AST 25  0 - 37 U/L   ALT 31  0 - 53 U/L   Alkaline Phosphatase 53  39 - 117 U/L   Total Bilirubin 0.3  0.3 - 1.2 mg/dL   GFR calc non Af Amer 36 (*) >90 mL/min   GFR calc Af Amer 41 (*) >90 mL/min  TROPONIN I      Component Value Range   Troponin I <0.30  <0.30 ng/mL  PROTIME-INR      Component Value Range   Prothrombin Time 12.8  11.6 - 15.2 seconds   INR 0.94  0.00 - 1.49  POCT I-STAT TROPONIN I   Component Value Range   Troponin i, poc 0.00  0.00 - 0.08 ng/mL   Comment 3           POCT I-STAT TROPONIN I      Component Value Range   Troponin i, poc 0.00  0.00 - 0.08 ng/mL   Comment 3            Dg Chest 2 View  08/11/2011  *RADIOLOGY REPORT*  Clinical Data: Chest pain and shortness of breath.  CHEST - 2 VIEW  Comparison: 11/28/2010  Findings: Two views of the chest demonstrate a right cardiac pacemaker.  Stable appearance of the heart and mediastinum. Trachea is  midline.  No evidence for edema or airspace disease. Bony thorax appears intact.  IMPRESSION: No acute chest findings.  Right cardiac pacemaker.  Original Report Authenticated By: Richarda Overlie, M.D.     Pt has been advised of the symptoms that warrant their return to the ED. Patient has voiced understanding and has agreed to follow-up with the PCP or specialist.   Dorthula Matas, PA 08/11/11 [phone]

## 2011-08-11 NOTE — ED Provider Notes (Signed)
History     CSN: [id number]  Arrival date & time 08/11/11  1312   First MD Initiated Contact with Patient 08/11/11 1516      Chief Complaint  Patient presents with  . Chest Pain    (Consider location/radiation/quality/duration/timing/severity/associated sxs/prior treatment) Patient is a 78 y.o. male presenting with chest pain. The history is provided by the patient.  Chest Pain The chest pain began 3 - 5 hours ago. Duration of episode(s) is 2 hours. Chest pain occurs rarely. The chest pain is resolved. Associated with: nothing. The pain is currently at 0/10. The severity of the pain is mild. The quality of the pain is described as heavy. The pain does not radiate. Exacerbated by: nothing. Pertinent negatives for primary symptoms include no fever, no fatigue, no shortness of breath, no cough, no wheezing, no palpitations, no abdominal pain, no nausea and no vomiting.  Pertinent negatives for associated symptoms include no numbness and no weakness. He tried nothing for the symptoms. Risk factors include lack of exercise, male gender, obesity and sedentary lifestyle.  His past medical history is significant for hyperlipidemia and hypertension.  His family medical history is significant for CAD in family and diabetes in family.  Procedure history is positive for cardiac catheterization and echocardiogram.     Past Medical History  Diagnosis Date  . Emphysema     Oxygen-dependent  . Chronic systolic heart failure   . Implantable cardiac defibrillator infection     Drainage identified July 2012  . ICD (implantable cardiac defibrillator) in place     CRT St Jude; remote - yes  . Cardiomyopathy secondary     Catheterization 2005 no obstructive coronary disease  . HLD (hyperlipidemia)   . Dysrhythmia   . Shortness of breath 11/27/10     "@ rest; sitting up; exerction"  . Hypertension   . Myocardial infarction 1990's  . Peptic ulcer     "bleeding ulcers"  . GI bleeding   . COPD  (chronic obstructive pulmonary disease)   . Hyperlipidemia   . BPH (benign prostatic hypertrophy)   . DM (diabetes mellitus)     Past Surgical History  Procedure Date  . Adenoidectomy   . Doppler echocardiography 2005, 2011  . Cardiac defibrillator placement 11/27/10    "this is my 3rd defibrillator"  . Cervical disc surgery 1990's    "slipped"  . Laceration repair ~ 1939    right leg  . Tonsillectomy ~ 1940  . Insert / replace / remove pacemaker 11/27/10    "this is my third one"  . Eye surgery   . Cataract extraction     "left eye"  . Cardiac catheterization 12/01/2003    Family History  Problem Relation Age of Onset  . Cancer Father   . Cancer Mother     throat  . Lung cancer Daughter     History  Substance Use Topics  . Smoking status: Former Smoker -- 2.0 packs/day    Types: Cigarettes    Quit date: 06/27/2009  . Smokeless tobacco: Never Used   Comment: started at age 16; smoked 1-2.5 ppd; quit 11/11  . Alcohol Use: No      Review of Systems  Constitutional: Negative for fever, activity change, appetite change and fatigue.  HENT: Negative for congestion, sore throat, facial swelling, rhinorrhea, trouble swallowing, neck pain, neck stiffness, voice change and sinus pressure.   Eyes: Negative.   Respiratory: Negative for cough, choking, chest tightness, shortness of breath and wheezing.  Cardiovascular: Positive for chest pain. Negative for palpitations and leg swelling.  Gastrointestinal: Negative for nausea, vomiting and abdominal pain.  Genitourinary: Negative for dysuria, urgency, frequency, hematuria, flank pain and difficulty urinating.  Musculoskeletal: Negative for back pain and gait problem.  Skin: Negative for rash and wound.  Neurological: Negative for facial asymmetry, weakness, numbness and headaches.  Psychiatric/Behavioral: Negative for behavioral problems, confusion and agitation. The patient is not nervous/anxious and is not hyperactive.     All other systems reviewed and are negative.    Allergies  Adhesive  Home Medications   Current Outpatient Rx  Name Route Sig Dispense Refill  . ALBUTEROL SULFATE 1.25 MG/3ML IN NEBU Nebulization Take 1 ampule by nebulization 2 (two) times daily.     . DORZOLAMIDE HCL 2 % OP SOLN Both Eyes Place 1 drop into both eyes 2 (two) times daily.      . COSOPT OP Ophthalmic Apply 1 drop to eye 2 (two) times daily.      . ENALAPRIL MALEATE 10 MG PO TABS  TAKE ONE (1) TABLET EACH DAY 30 tablet 6    PLEASE RESPOND  . FAMOTIDINE 20 MG PO TABS Oral Take 20 mg by mouth at bedtime.      Marland Kitchen FLUTICASONE-SALMETEROL 250-50 MCG/DOSE IN AEPB Inhalation Inhale 1 puff into the lungs 2 (two) times daily.      . FUROSEMIDE 40 MG PO TABS  Take one by mouth twice daily 60 tablet 11  . GLIMEPIRIDE 2 MG PO TABS Oral Take 2 mg by mouth 2 (two) times daily.      Marland Kitchen POLYSACCHARIDE IRON COMPLEX 150 MG PO CAPS Oral Take 150 mg by mouth 2 (two) times daily.     Marland Kitchen METOPROLOL SUCCINATE ER 50 MG PO TB24 Oral Take 50 mg by mouth daily.      Marland Kitchen POTASSIUM CHLORIDE 10 MEQ PO TBCR Oral Take 10 mEq by mouth 2 (two) times daily.      Marland Kitchen SIMVASTATIN 20 MG PO TABS Oral Take 20 mg by mouth at bedtime.      . TAMSULOSIN HCL 0.4 MG PO CAPS Oral Take 0.4 mg by mouth daily.      Marland Kitchen TIOTROPIUM BROMIDE MONOHYDRATE 18 MCG IN CAPS Inhalation Place 18 mcg into inhaler and inhale daily.    . TRAMADOL HCL 50 MG PO TABS Oral Take 50 mg by mouth every 6 (six) hours as needed. Maximum dose= 8 tablets per day     . WARFARIN SODIUM 5 MG PO TABS Oral Take 5 mg by mouth daily.       BP 102/71  Pulse 87  Temp 97.8 F (36.6 C)  Resp 16  SpO2 95%  Physical Exam  Nursing note and vitals reviewed. Constitutional: He is oriented to person, place, and time. He appears well-developed and well-nourished. No distress.  HENT:  Head: Normocephalic and atraumatic.  Right Ear: External ear normal.  Left Ear: External ear normal.  Mouth/Throat: No  oropharyngeal exudate.  Eyes: Conjunctivae and EOM are normal. Pupils are equal, round, and reactive to light. Right eye exhibits no discharge. Left eye exhibits no discharge.  Neck: Normal range of motion. Neck supple. No JVD present. No tracheal deviation present. No thyromegaly present.  Cardiovascular: Normal rate, regular rhythm, normal heart sounds and intact distal pulses.  Exam reveals no gallop and no friction rub.   No murmur heard. Pulmonary/Chest: Effort normal and breath sounds normal. No respiratory distress. He has no wheezes. He exhibits no tenderness.  Chest sounds somewhat decreased bilaterally.  Abdominal: Soft. Bowel sounds are normal. He exhibits no distension. There is no tenderness. There is no rebound and no guarding.  Musculoskeletal: Normal range of motion. He exhibits no edema and no tenderness.  Lymphadenopathy:    He has no cervical adenopathy.  Neurological: He is alert and oriented to person, place, and time. No cranial nerve deficit.  Skin: Skin is warm and dry. No rash noted. He is not diaphoretic. No pallor.  Psychiatric: He has a normal mood and affect. His behavior is normal.    ED Course  Procedures (including critical care time)  Labs Reviewed  CBC WITH DIFFERENTIAL - Abnormal; Notable for the following:    WBC 11.0 (*)     All other components within normal limits  COMPREHENSIVE METABOLIC PANEL - Abnormal; Notable for the following:    Potassium 5.2 (*)     Glucose, Bld 240 (*)     BUN 24 (*)     Creatinine, Ser 1.72 (*)     GFR calc non Af Amer 36 (*)     GFR calc Af Amer 41 (*)     All other components within normal limits  PROTIME-INR  TROPONIN I   Dg Chest 2 View  08/11/2011  *RADIOLOGY REPORT*  Clinical Data: Chest pain and shortness of breath.  CHEST - 2 VIEW  Comparison: 11/28/2010  Findings: Two views of the chest demonstrate a right cardiac pacemaker.  Stable appearance of the heart and mediastinum. Trachea is midline.  No  evidence for edema or airspace disease. Bony thorax appears intact.  IMPRESSION: No acute chest findings.  Right cardiac pacemaker.  Original Report Authenticated By: Richarda Overlie, M.D.     No diagnosis found.    MDM  78 year old male patient with past medical history of hyperlipidemia chronic systolic heart failure defibrillator pacer diabetes hypertension presents with chest tightness that lasted about 2 hours. Patient says he was at home watching TV when he felt a sudden sensation of chest tightness substernally. The pain did not radiate it was associated with diaphoresis it was mild in nature according to patient. Patient was concerned so came to the emergency department for evaluation. Patient without fevers nausea vomiting abdominal pain headache or neck pain. Patient wears 4 L oxygen at home for his COPD and does not feel worsening shortness of breath today. Patient now symptom-free and feels that he is at his baseline. Patient with normal physical exam above except for some decreased breath sounds bilaterally likely consistent with his chronic obstructive pulmonary disease. Patient has implantable cardiac defibrillator device which does not appear to be infected and his pain was at this point in time.  Results for orders placed during the hospital encounter of 08/11/11  CBC WITH DIFFERENTIAL      Component Value Range   WBC 11.0 (*) 4.0 - 10.5 K/uL   RBC 4.55  4.22 - 5.81 MIL/uL   Hemoglobin 13.3  13.0 - 17.0 g/dL   HCT 45.4  09.8 - 11.9 %   MCV 89.9  78.0 - 100.0 fL   MCH 29.2  26.0 - 34.0 pg   MCHC 32.5  30.0 - 36.0 g/dL   RDW 14.7  82.9 - 56.2 %   Platelets 205  150 - 400 K/uL   Neutrophils Relative 66  43 - 77 %   Lymphocytes Relative 21  12 - 46 %   Monocytes Relative 8  3 - 12 %   Eosinophils Relative 4  0 - 5 %   Basophils Relative 1  0 - 1 %   Neutro Abs 7.3  1.7 - 7.7 K/uL   Lymphs Abs 2.3  0.7 - 4.0 K/uL   Monocytes Absolute 0.9  0.1 - 1.0 K/uL   Eosinophils Absolute 0.4   0.0 - 0.7 K/uL   Basophils Absolute 0.1  0.0 - 0.1 K/uL   Smear Review MORPHOLOGY UNREMARKABLE    COMPREHENSIVE METABOLIC PANEL      Component Value Range   Sodium 139  135 - 145 mEq/L   Potassium 5.2 (*) 3.5 - 5.1 mEq/L   Chloride 102  96 - 112 mEq/L   CO2 30  19 - 32 mEq/L   Glucose, Bld 240 (*) 70 - 99 mg/dL   BUN 24 (*) 6 - 23 mg/dL   Creatinine, Ser 1.30 (*) 0.50 - 1.35 mg/dL   Calcium 9.7  8.4 - 86.5 mg/dL   Total Protein 7.8  6.0 - 8.3 g/dL   Albumin 4.1  3.5 - 5.2 g/dL   AST 25  0 - 37 U/L   ALT 31  0 - 53 U/L   Alkaline Phosphatase 53  39 - 117 U/L   Total Bilirubin 0.3  0.3 - 1.2 mg/dL   GFR calc non Af Amer 36 (*) >90 mL/min   GFR calc Af Amer 41 (*) >90 mL/min  PROTIME-INR      Component Value Range   Prothrombin Time 12.8  11.6 - 15.2 seconds   INR 0.94  0.00 - 1.49   DG Chest 2 View (Final result)   Result time:08/11/11 1431    Final result by Rad Results In Interface (08/11/11 14:31:05)    Narrative:   *RADIOLOGY REPORT*  Clinical Data: Chest pain and shortness of breath.  CHEST - 2 VIEW  Comparison: 11/28/2010  Findings: Two views of the chest demonstrate a right cardiac pacemaker. Stable appearance of the heart and mediastinum. Trachea is midline. No evidence for edema or airspace disease. Bony thorax appears intact.  IMPRESSION: No acute chest findings.  Right cardiac pacemaker.  Original Report Authenticated By: Richarda Overlie, M.D.    Date: 08/11/2011  Rate: 82  Rhythm: paced rhythm  QRS Axis: normal  Intervals: normal  ST/T Wave abnormalities: normal  Conduction Disutrbances:none  Narrative Interpretation: negative Scarbossa Criteris  Old EKG Reviewed: unchanged   I doubt PE given low clinical suspicion based on physical exam and history of present illness. Well score 0. Patient without JVD swelling in feet or clinical signs worsening heart failure. Patient doesn't feel his COPD symptoms are worsening. Patient without evidence of  pneumothorax pleural effusion or pericarditis. Patient could be having acute coronary syndrome. It appears well now. We will trend troponins and contact cardiology for possible further stress testing.  Cardiology contacted and spoken to by the patient. They agreed given the atypical presentation and wellness of the patient that he can followup for outpatient stress testing. Patient sent to CDU for second troponin where he will be dispositioned to followup with his cardiologist.  Case discussed with Juleen China.         Sherryl Manges, MD 08/11/11 2329

## 2011-08-11 NOTE — Progress Notes (Signed)
Pt will be seen in our [address] office on Thursday August 14, 2011 @ 10:45 am by Ward Givens, NP

## 2011-08-11 NOTE — ED Notes (Signed)
Pt states chest heaviness x 1 hour no n/v/sob

## 2011-08-12 NOTE — ED Provider Notes (Signed)
Medical screening examination/treatment/procedure(s) were performed by non-physician practitioner and as supervising physician I was immediately available for consultation/collaboration.  Raeford Razor, MD 08/12/11 1700

## 2011-08-14 ENCOUNTER — Ambulatory Visit (INDEPENDENT_AMBULATORY_CARE_PROVIDER_SITE_OTHER): Payer: Medicare Other | Admitting: Nurse Practitioner

## 2011-08-14 ENCOUNTER — Encounter: Payer: Self-pay | Admitting: Nurse Practitioner

## 2011-08-14 VITALS — BP 114/70 | HR 68 | Ht 68.0 in | Wt 208.0 lb

## 2011-08-14 DIAGNOSIS — R072 Precordial pain: Secondary | ICD-10-CM

## 2011-08-14 DIAGNOSIS — I5022 Chronic systolic (congestive) heart failure: Secondary | ICD-10-CM

## 2011-08-14 DIAGNOSIS — I509 Heart failure, unspecified: Secondary | ICD-10-CM

## 2011-08-14 DIAGNOSIS — R0789 Other chest pain: Secondary | ICD-10-CM

## 2011-08-14 DIAGNOSIS — I428 Other cardiomyopathies: Secondary | ICD-10-CM

## 2011-08-14 NOTE — Progress Notes (Signed)
Patient Name: Robert Davies Date of Encounter: 08/14/2011  Primary Care Provider:  Georgann Housekeeper, MD Primary Cardiologist:  Garnette Scheuermann, MD/  EP: Rosette Reveal, MD  Patient Profile  78 y/o male with h/o NICM s/p ICD who was recently seen in the ED 2/2 chest pain.  Problem List   Past Medical History  Diagnosis Date  . Emphysema     Oxygen-dependent  . Chronic systolic heart failure   . Implantable cardiac defibrillator infection     Drainage identified July 2012  . ICD (implantable cardiac defibrillator) in place     CRT St Jude; remote - yes  . Nonischemic cardiomyopathy     a. Catheterization 2005 no obstructive coronary disease;  b. 02/2009 Echo: EF 35-40%, Gr 2 DD, mild lvh.  Marland Kitchen HLD (hyperlipidemia)   . Dysrhythmia   . Shortness of breath 11/27/10     "@ rest; sitting up; exerction"  . Hypertension   . Myocardial infarction 1990's  . Peptic ulcer     "bleeding ulcers"  . GI bleeding   . COPD (chronic obstructive pulmonary disease)     a. On 3lpm of O2  . Hyperlipidemia   . BPH (benign prostatic hypertrophy)   . DM (diabetes mellitus)    Past Surgical History  Procedure Date  . Adenoidectomy   . Doppler echocardiography 2005, 2011  . Cardiac defibrillator placement 11/27/10    "this is my 3rd defibrillator"  . Cervical disc surgery 1990's    "slipped"  . Laceration repair ~ 1939    right leg  . Tonsillectomy ~ 1940  . Insert / replace / remove pacemaker 11/27/10    "this is my third one"  . Eye surgery   . Cataract extraction     "left eye"  . Cardiac catheterization 12/01/2003    Allergies  Allergies  Allergen Reactions  . Adhesive (Tape)     HPI  78 y/o pt of Dr. Katrinka Blazing, who is seen in our office for his EP needs.  He has chronic DOE and wears 3lpm of O2 daily.  He weighs himself daily and notes that his weight stays about 205 on his scale @ home.  This past Monday, he was at home in the morning and had sudden onset of midsternal chest pain w/o assoc  Ss.  He drove himself to the [facility] ED, where ECG was unrevealing and CE were negative.  Our office was contacted and he was added to my schedule today.  He has had no further chest pain.  He says that he occasionally has had chest pain over the years but says it occurs only a few times/yr.  He denies pnd, orthopnea, n, v, dizziness, syncope, edema, or early satiety.  Home Medications  Prior to Admission medications   Medication Sig Start Date End Date Taking? Authorizing Provider  albuterol (ACCUNEB) 1.25 MG/3ML nebulizer solution Take 1 ampule by nebulization 2 (two) times daily.    Yes Historical Provider, MD  dorzolamide (TRUSOPT) 2 % ophthalmic solution Place 1 drop into both eyes 2 (two) times daily.     Yes Historical Provider, MD  Dorzolamide HCl-Timolol Mal (COSOPT OP) Apply 1 drop to eye 2 (two) times daily.     Yes Historical Provider, MD  enalapril (VASOTEC) 10 MG tablet TAKE ONE (1) TABLET EACH DAY 05/26/11  Yes Marinus Maw, MD  famotidine (PEPCID) 20 MG tablet Take 20 mg by mouth at bedtime.     Yes Historical Provider, MD  Fluticasone-Salmeterol (  ADVAIR DISKUS) 250-50 MCG/DOSE AEPB Inhale 1 puff into the lungs 2 (two) times daily.     Yes Historical Provider, MD  furosemide (LASIX) 40 MG tablet Take one by mouth twice daily 06/06/11  Yes Marinus Maw, MD  glimepiride (AMARYL) 2 MG tablet Take 2 mg by mouth 2 (two) times daily.     Yes Historical Provider, MD  iron polysaccharides (NIFEREX) 150 MG capsule Take 150 mg by mouth 2 (two) times daily.    Yes Historical Provider, MD  metoprolol (TOPROL-XL) 50 MG 24 hr tablet Take 50 mg by mouth daily.     Yes Historical Provider, MD  potassium chloride (KLOR-CON) 10 MEQ CR tablet Take 10 mEq by mouth 2 (two) times daily.     Yes Historical Provider, MD  simvastatin (ZOCOR) 20 MG tablet Take 20 mg by mouth at bedtime.     Yes Historical Provider, MD  Tamsulosin HCl (FLOMAX) 0.4 MG CAPS Take 0.4 mg by mouth daily.     Yes Historical Provider,  MD  tiotropium (SPIRIVA) 18 MCG inhalation capsule Place 18 mcg into inhaler and inhale daily.   Yes Historical Provider, MD  traMADol (ULTRAM) 50 MG tablet Take 50 mg by mouth every 6 (six) hours as needed. Maximum dose= 8 tablets per day    Yes Historical Provider, MD  warfarin (COUMADIN) 5 MG tablet Take 5 mg by mouth daily.    Yes Historical Provider, MD    Review of Systems  1 episode of c/p as outlined above.  He has chronic DOE.  All other systems reviewed and are otherwise negative except as noted above.  Physical Exam  Blood pressure 114/70, pulse 68, height 5\' 8"  (1.727 m), weight 208 lb (94.348 kg), SpO2 96.00%.  General: Pleasant, NAD Psych: Normal affect. Neuro: Alert and oriented X 3. Moves all extremities spontaneously. HEENT: Normal  Neck: Supple without bruits or JVD. Lungs:  Resp regular and unlabored, markedly diminished breath sounds bilat. Heart: RRR no s3, s4, or murmurs. Abdomen: Soft, non-tender, non-distended, BS + x 4.  Extremities: No clubbing, cyanosis or edema. DP/PT/Radials 2+ and equal bilaterally.  Accessory Clinical Findings  ECG - a sense, v pace, 67.  No acute st/t changes.  Assessment & Plan  1.  Midsternal chest pain:  Pt with occasional c/p over the course of the past few years and 1 episode earlier this week.  Ss lasted about an hour and resolved spont.  CE were negative in ER.  It's been 8 yrs since his last cath.  He would benefit from an ischemic eval, prob lexiscan mv, however he would like to touch base with his primary cardiologist, Dr. Mendel Ryder, first.    2.  NICM/Chronic systolic CHF:  Euvolemic.  He has chronic DOE in part related to his severe COPD.  Cont bb, acei, current dose of diuretitc.  Consider f/u echo with Dr. Katrinka Blazing.  He has device clinic f/u in August.  Nicolasa Ducking, NP 08/14/2011, 12:22 PM

## 2011-08-15 NOTE — ED Provider Notes (Signed)
I saw and evaluated the patient, reviewed the resident's note and I agree with the findings and plan.  82yM with CP which has since resolved. EKG with no acute changes. HD stable. Significant cardic hx but low suspicion for today's symptoms being cardiac in nature. Initial w/u fairly unremarkable. Will obtain second set of enzymes.  Raeford Razor, MD 08/15/11 [phone]

## 2011-09-10 ENCOUNTER — Encounter: Payer: Self-pay | Admitting: Internal Medicine

## 2011-09-10 ENCOUNTER — Ambulatory Visit (INDEPENDENT_AMBULATORY_CARE_PROVIDER_SITE_OTHER): Payer: Medicare Other | Admitting: Internal Medicine

## 2011-09-10 VITALS — BP 102/62 | HR 60 | Ht 67.0 in | Wt 187.1 lb

## 2011-09-10 DIAGNOSIS — I5022 Chronic systolic (congestive) heart failure: Secondary | ICD-10-CM

## 2011-09-10 DIAGNOSIS — T827XXA Infection and inflammatory reaction due to other cardiac and vascular devices, implants and grafts, initial encounter: Secondary | ICD-10-CM

## 2011-09-10 DIAGNOSIS — I429 Cardiomyopathy, unspecified: Secondary | ICD-10-CM

## 2011-09-10 LAB — PACEMAKER DEVICE OBSERVATION
AL AMPLITUDE: 3.5 mv
AL IMPEDENCE PM: 480 Ohm
BATTERY VOLTAGE: 2.93 V
LV LEAD THRESHOLD: 1.875 V

## 2011-09-10 NOTE — Patient Instructions (Addendum)
Remote monitoring is used to monitor your Pacemaker of ICD from home. This monitoring reduces the number of office visits required to check your device to one time per year. It allows [place] to keep an eye on the functioning of your device to ensure it is working properly. You are scheduled for a device check from home on November 25. 2013. You may send your transmission at any time that day. If you have a wireless device, the transmission will be sent automatically. After your physician reviews your transmission, you will receive a postcard with your next transmission date.  Your physician wants you to follow-up in: 1 year with Dr Ladona Ridgel.  You will receive a reminder letter in the mail two months in advance. If you don't receive a letter, please call our office to schedule the follow-up appointment.

## 2011-09-10 NOTE — Progress Notes (Signed)
HPI Robert Davies returns today for followup. He is a very pleasant 78 year old man with a history of chronic systolic heart failure, severe oxygen-dependent COPD, status post ICD implantation. The patient's shortness of breath remains class 2-3. He has tried to maintain a low-sodium diet. He is quit smoking cigarettes. He denies syncope. No recent ICD shocks. He denies chest pain. Allergies  Allergen Reactions  . Adhesive (Tape)      Current Outpatient Prescriptions  Medication Sig Dispense Refill  . albuterol (ACCUNEB) 1.25 MG/3ML nebulizer solution Take 1 ampule by nebulization 2 (two) times daily.       . dorzolamide (TRUSOPT) 2 % ophthalmic solution Place 1 drop into both eyes 2 (two) times daily.        . Dorzolamide HCl-Timolol Mal (COSOPT OP) Apply 1 drop to eye 2 (two) times daily.        . enalapril (VASOTEC) 10 MG tablet TAKE ONE (1) TABLET EACH DAY  30 tablet  6  . famotidine (PEPCID) 20 MG tablet Take 20 mg by mouth at bedtime.        . Fluticasone-Salmeterol (ADVAIR DISKUS) 250-50 MCG/DOSE AEPB Inhale 1 puff into the lungs 2 (two) times daily.        . furosemide (LASIX) 40 MG tablet Take one by mouth twice daily  60 tablet  11  . glimepiride (AMARYL) 2 MG tablet Take 2 mg by mouth 2 (two) times daily.        . iron polysaccharides (NIFEREX) 150 MG capsule Take 150 mg by mouth 2 (two) times daily.       . metoprolol (TOPROL-XL) 50 MG 24 hr tablet Take 50 mg by mouth daily.        . potassium chloride (KLOR-CON) 10 MEQ CR tablet Take 10 mEq by mouth 2 (two) times daily.        . simvastatin (ZOCOR) 20 MG tablet Take 20 mg by mouth at bedtime.        . Tamsulosin HCl (FLOMAX) 0.4 MG CAPS Take 0.4 mg by mouth daily.        Marland Kitchen tiotropium (SPIRIVA) 18 MCG inhalation capsule Place 18 mcg into inhaler and inhale daily.      . traMADol (ULTRAM) 50 MG tablet Take 50 mg by mouth every 6 (six) hours as needed. Maximum dose= 8 tablets per day       . warfarin (COUMADIN) 5 MG tablet Take 5 mg  by mouth daily.          Past Medical History  Diagnosis Date  . Emphysema     Oxygen-dependent  . Chronic systolic heart failure   . Implantable cardiac defibrillator infection     Drainage identified July 2012  . ICD (implantable cardiac defibrillator) in place     CRT St Jude; remote - yes  . Nonischemic cardiomyopathy     a. Catheterization 2005 no obstructive coronary disease;  b. 02/2009 Echo: EF 35-40%, Gr 2 DD, mild lvh.  Marland Kitchen HLD (hyperlipidemia)   . Dysrhythmia   . Shortness of breath 11/27/10     "@ rest; sitting up; exerction"  . Hypertension   . Myocardial infarction 1990's  . Peptic ulcer     "bleeding ulcers"  . GI bleeding   . COPD (chronic obstructive pulmonary disease)     a. On 3lpm of O2  . Hyperlipidemia   . BPH (benign prostatic hypertrophy)   . DM (diabetes mellitus)     ROS:   All systems reviewed  and negative except as noted in the HPI.   Past Surgical History  Procedure Date  . Adenoidectomy   . Doppler echocardiography 2005, 2011  . Cardiac defibrillator placement 11/27/10    "this is my 3rd defibrillator"  . Cervical disc surgery 1990's    "slipped"  . Laceration repair ~ 1939    right leg  . Tonsillectomy ~ 1940  . Insert / replace / remove pacemaker 11/27/10    "this is my third one"  . Eye surgery   . Cataract extraction     "left eye"  . Cardiac catheterization 12/01/2003     Family History  Problem Relation Age of Onset  . Cancer Father   . Cancer Mother     throat  . Lung cancer Daughter      History   Social History  . Marital Status: Widowed    Spouse Name: N/A    Number of Children: N/A  . Years of Education: N/A   Occupational History  . Not on file.   Social History Main Topics  . Smoking status: Former Smoker -- 2.0 packs/day    Types: Cigarettes    Quit date: 06/20/2011  . Smokeless tobacco: Never Used   Comment: started at age 89; smoked 1-2.5 ppd; quit 11/11  . Alcohol Use: No  . Drug Use: No  .  Sexually Active: Not on file   Other Topics Concern  . Not on file   Social History Narrative   Divorced, retired [employer] architect.      BP 102/62  Pulse 60  Ht 5\' 7"  (1.702 m)  Wt 187 lb 1.9 oz (84.877 kg)  BMI 29.31 kg/m2  Physical Exam:  Well appearing 78 year old man, NAD HEENT: Unremarkable Neck:  No JVD, no thyromegally Lungs:  Clear with no wheezes or rhonchi. Rare scattered rales are present bilaterally in the bases. Well-healed ICD incision HEART:  Regular rate rhythm, no murmurs, no rubs, no clicks Abd:  soft, positive bowel sounds, no organomegally, no rebound, no guarding Ext:  2 plus pulses, no edema, no cyanosis, no clubbing Skin:  No rashes no nodules Neuro:  CN II through XII intact, motor grossly intact   DEVICE  Normal device function.  See PaceArt for details.   Assess/Plan:

## 2011-09-10 NOTE — Assessment & Plan Note (Signed)
His congestive heart failure symptoms are stable. I've encouraged the patient to maintain a low-sodium diet and continue his current medical therapy. His differential her bladder is working normally.

## 2011-09-10 NOTE — Assessment & Plan Note (Signed)
His device is working normally. We'll plan to recheck in several months. 

## 2011-12-10 ENCOUNTER — Other Ambulatory Visit: Payer: Self-pay | Admitting: *Deleted

## 2011-12-10 IMAGING — CR DG CHEST 2V
2 series · 2 of 2 positions shown · non-contrast
Comparison: Chest x-ray of 10/22/2009

CLINICAL DATA: Preop for pacemaker removal, infection

CHEST - 2 VIEW

[view not recorded (1 of 2)]
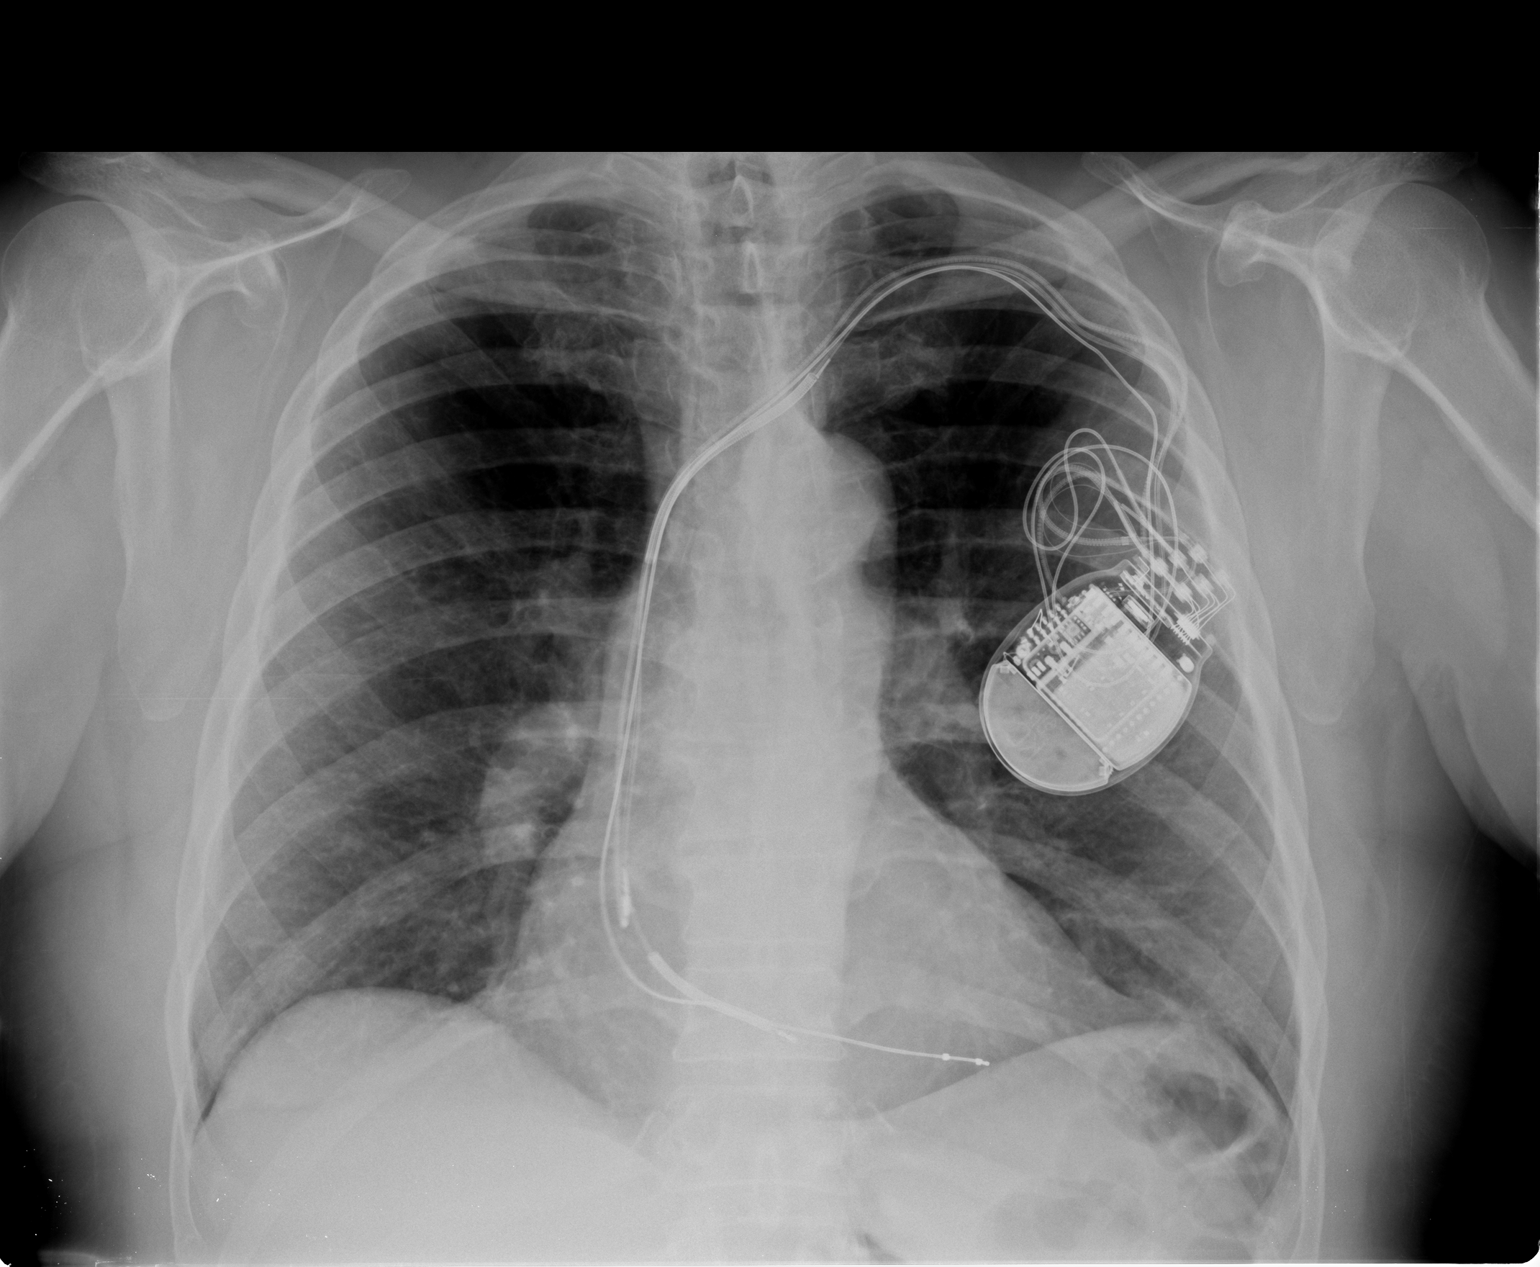

[view not recorded (2 of 2)]
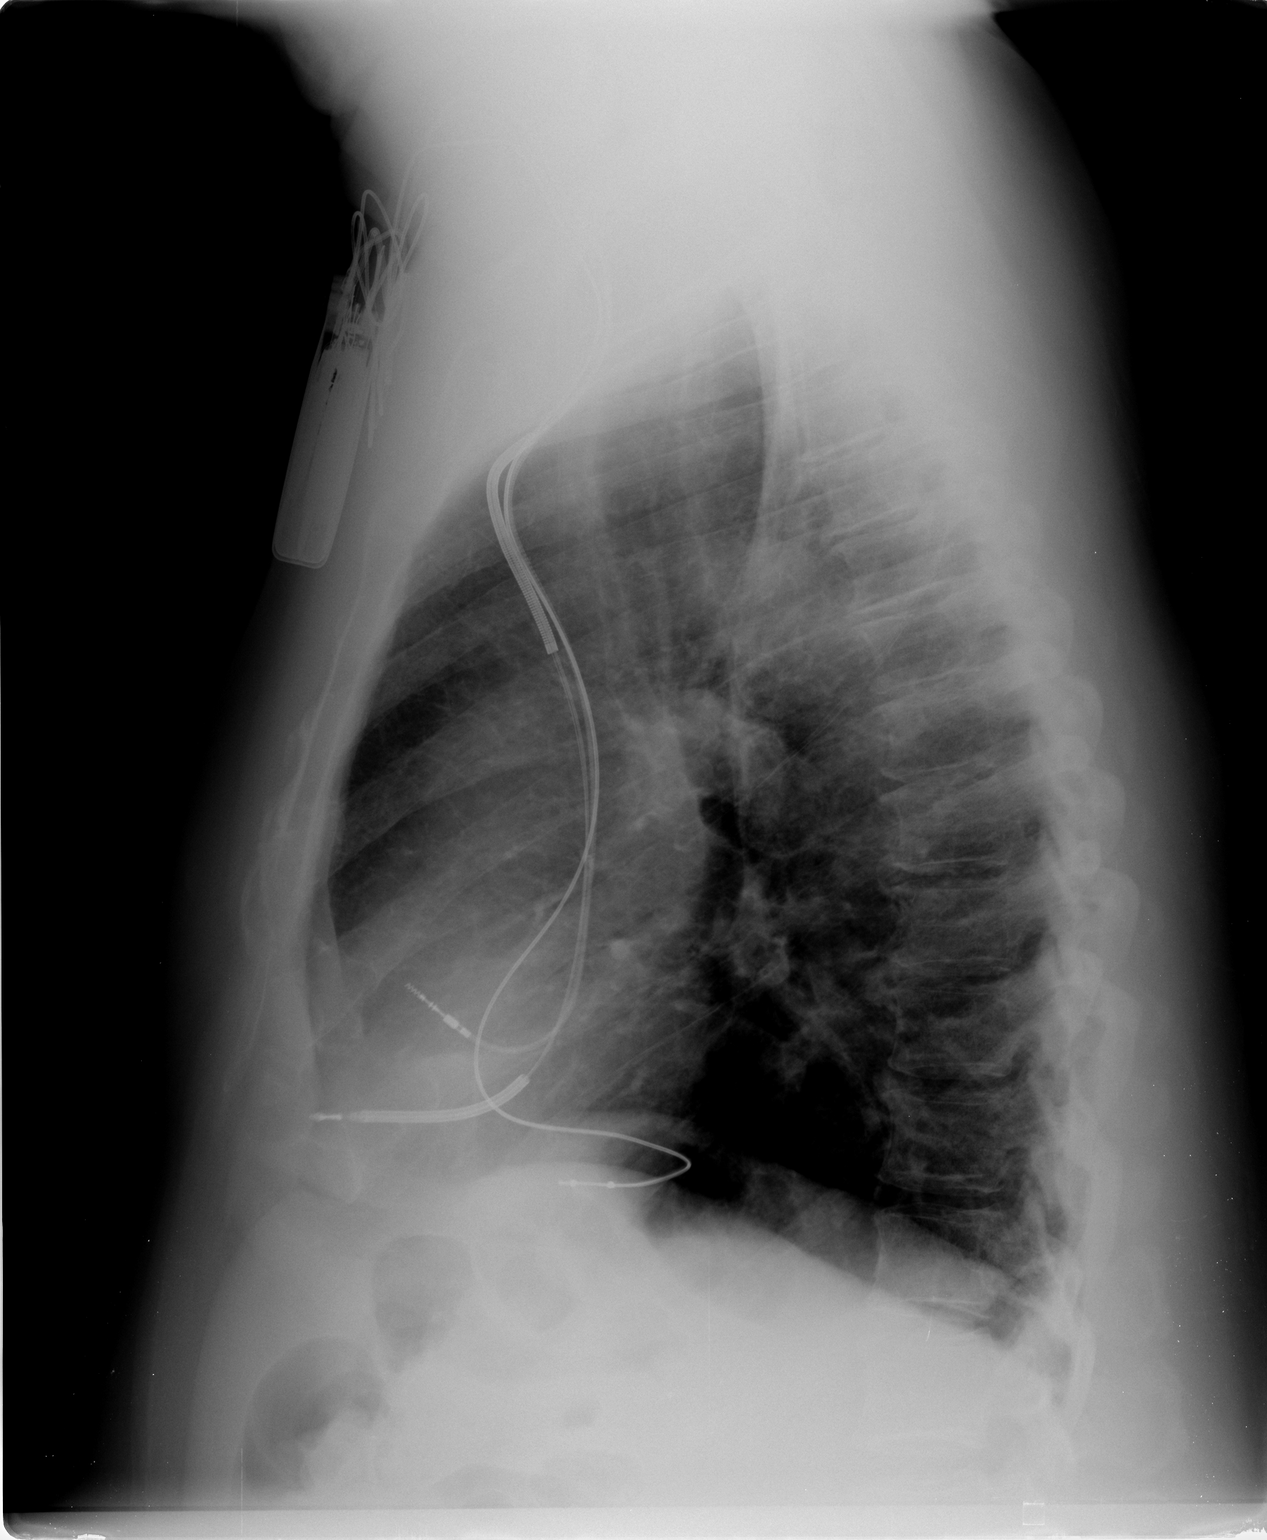

[2 of 2 positions shown; findings below may reference images not displayed]

FINDINGS: The lungs are clear.  Somewhat coarse interstitial
markings appear chronic.  Mild cardiomegaly is stable.  A pacemaker
with AICD lead remains.
IMPRESSION: Stable mild cardiomegaly with pacer.  Stable mild chronic
interstitial change.

## 2011-12-10 IMAGING — CR DG CHEST 1V PORT
1 series · 1 of 1 positions shown · non-contrast
Comparison: 09/13/2010 at 1966 hours

CLINICAL DATA: Postop pacemaker removal

PORTABLE CHEST - 1 VIEW

[AP]
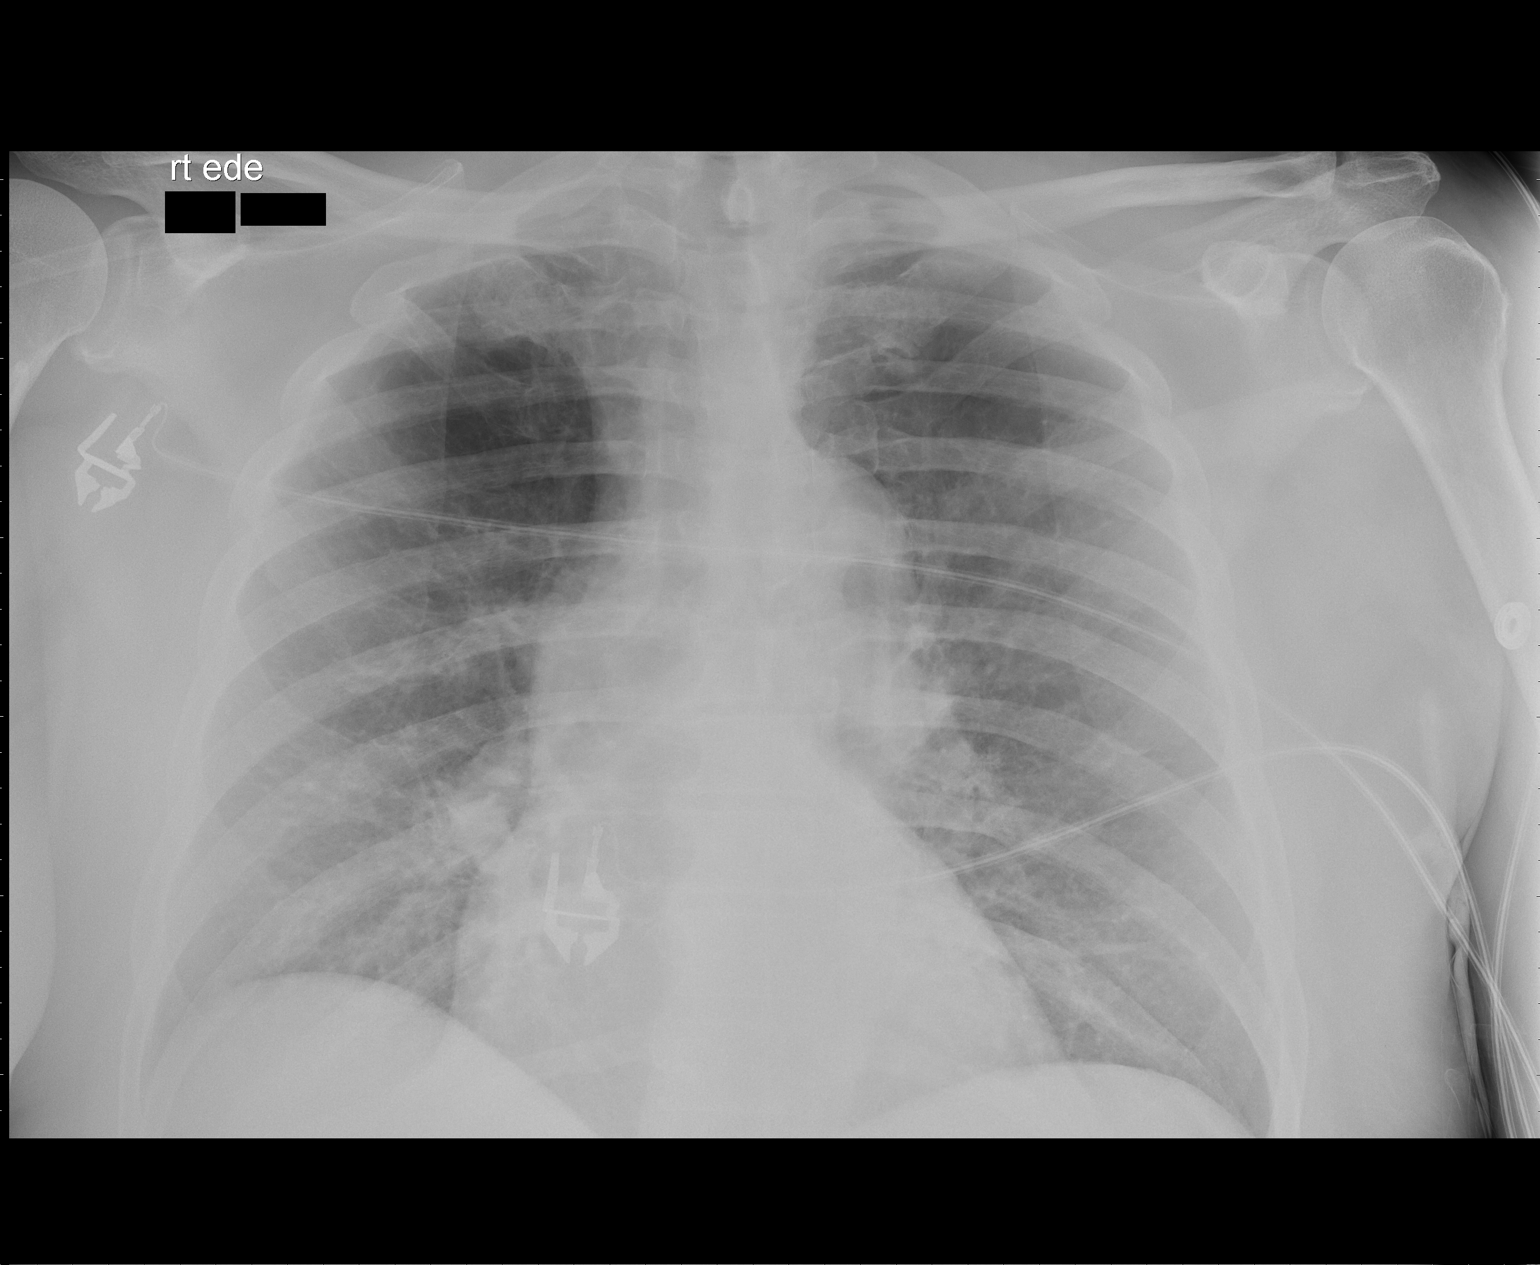

[1 of 1 positions shown; findings below may reference images not displayed]

FINDINGS: Chronic interstitial markings.  Lungs otherwise clear. No
pleural effusion or pneumothorax.

Interval removal of left subclavian ICD.

Stable mild cardiomegaly.
IMPRESSION: Interval removal of left subclavian ICD.  No pneumothorax.

Stable mild cardiomegaly.

## 2011-12-10 MED ORDER — ENALAPRIL MALEATE 10 MG PO TABS: 10.0000 mg | ORAL_TABLET | Freq: Every day | ORAL | Status: AC

## 2011-12-15 ENCOUNTER — Encounter: Payer: Medicare Other | Admitting: *Deleted

## 2011-12-17 ENCOUNTER — Encounter: Payer: Self-pay | Admitting: *Deleted

## 2011-12-23 ENCOUNTER — Telehealth: Payer: Self-pay | Admitting: Internal Medicine

## 2011-12-23 NOTE — Telephone Encounter (Signed)
Left message for patient.  I will have industry send out a cell phone adapter so he can continue with remote monitoring.

## 2011-12-23 NOTE — Telephone Encounter (Signed)
New Problem:    Patient's home health nurse called in because the patient does not have a land line and would like to know how to proceed.  Please call back.

## 2012-02-09 ENCOUNTER — Other Ambulatory Visit: Payer: Self-pay | Admitting: Internal Medicine

## 2012-02-09 DIAGNOSIS — K869 Disease of pancreas, unspecified: Secondary | ICD-10-CM

## 2012-02-11 ENCOUNTER — Ambulatory Visit
Admission: RE | Admit: 2012-02-11 | Discharge: 2012-02-11 | Disposition: A | Discharge status: Home / Self Care | Payer: Medicare Other | Source: Ambulatory Visit | Attending: Internal Medicine | Admitting: Internal Medicine

## 2012-02-11 DIAGNOSIS — K869 Disease of pancreas, unspecified: Secondary | ICD-10-CM

## 2012-02-13 ENCOUNTER — Encounter: Payer: Self-pay | Admitting: *Deleted

## 2012-02-17 ENCOUNTER — Other Ambulatory Visit (HOSPITAL_COMMUNITY): Payer: Self-pay | Admitting: Otolaryngology

## 2012-02-17 DIAGNOSIS — R221 Localized swelling, mass and lump, neck: Secondary | ICD-10-CM

## 2012-02-17 DIAGNOSIS — K118 Other diseases of salivary glands: Secondary | ICD-10-CM

## 2012-02-19 ENCOUNTER — Other Ambulatory Visit (HOSPITAL_COMMUNITY): Payer: Self-pay | Admitting: Otolaryngology

## 2012-02-19 ENCOUNTER — Ambulatory Visit (HOSPITAL_COMMUNITY)
Admission: RE | Admit: 2012-02-19 | Discharge: 2012-02-19 | Disposition: A | Discharge status: Home / Self Care | Payer: Medicare Other | Source: Ambulatory Visit | Attending: Otolaryngology | Admitting: Otolaryngology

## 2012-02-19 DIAGNOSIS — R221 Localized swelling, mass and lump, neck: Secondary | ICD-10-CM

## 2012-02-19 DIAGNOSIS — L049 Acute lymphadenitis, unspecified: Secondary | ICD-10-CM | POA: Insufficient documentation

## 2012-02-19 DIAGNOSIS — K118 Other diseases of salivary glands: Secondary | ICD-10-CM

## 2012-02-19 DIAGNOSIS — R220 Localized swelling, mass and lump, head: Secondary | ICD-10-CM | POA: Insufficient documentation

## 2012-03-17 ENCOUNTER — Other Ambulatory Visit: Payer: Self-pay

## 2012-03-17 ENCOUNTER — Ambulatory Visit (INDEPENDENT_AMBULATORY_CARE_PROVIDER_SITE_OTHER): Payer: Medicare Other | Admitting: *Deleted

## 2012-03-17 DIAGNOSIS — I5022 Chronic systolic (congestive) heart failure: Secondary | ICD-10-CM

## 2012-03-17 DIAGNOSIS — I429 Cardiomyopathy, unspecified: Secondary | ICD-10-CM

## 2012-03-17 LAB — PACEMAKER DEVICE OBSERVATION
AL AMPLITUDE: 3.2 mv
AL IMPEDENCE PM: 475 Ohm
AL THRESHOLD: 0.75 V
ATRIAL PACING PM: 25
BAMS-0001: 150 {beats}/min
BAMS-0003: 70 {beats}/min
BATTERY VOLTAGE: 2.9328 V
DEVICE MODEL PM: 2699993
RV LEAD IMPEDENCE PM: 512.5 Ohm
RV LEAD THRESHOLD: 0.625 V
VENTRICULAR PACING PM: 95
VENTRICULAR PACING PM: 95

## 2012-03-17 NOTE — Progress Notes (Signed)
PPM check 

## 2012-04-05 ENCOUNTER — Encounter: Payer: Self-pay | Admitting: Internal Medicine

## 2012-05-18 ENCOUNTER — Emergency Department (HOSPITAL_COMMUNITY): Payer: Medicare Other

## 2012-05-18 ENCOUNTER — Emergency Department (HOSPITAL_COMMUNITY)
Admission: EM | Admit: 2012-05-18 | Discharge: 2012-05-18 | Disposition: A | Discharge status: Home / Self Care | Payer: Medicare Other | Attending: Emergency Medicine | Admitting: Emergency Medicine

## 2012-05-18 ENCOUNTER — Encounter (HOSPITAL_COMMUNITY): Payer: Self-pay | Admitting: Emergency Medicine

## 2012-05-18 DIAGNOSIS — R609 Edema, unspecified: Secondary | ICD-10-CM | POA: Insufficient documentation

## 2012-05-18 DIAGNOSIS — I252 Old myocardial infarction: Secondary | ICD-10-CM | POA: Insufficient documentation

## 2012-05-18 DIAGNOSIS — E119 Type 2 diabetes mellitus without complications: Secondary | ICD-10-CM | POA: Insufficient documentation

## 2012-05-18 DIAGNOSIS — I10 Essential (primary) hypertension: Secondary | ICD-10-CM | POA: Insufficient documentation

## 2012-05-18 DIAGNOSIS — Z8709 Personal history of other diseases of the respiratory system: Secondary | ICD-10-CM | POA: Insufficient documentation

## 2012-05-18 DIAGNOSIS — R600 Localized edema: Secondary | ICD-10-CM

## 2012-05-18 DIAGNOSIS — Z8719 Personal history of other diseases of the digestive system: Secondary | ICD-10-CM | POA: Insufficient documentation

## 2012-05-18 DIAGNOSIS — J4489 Other specified chronic obstructive pulmonary disease: Secondary | ICD-10-CM | POA: Insufficient documentation

## 2012-05-18 DIAGNOSIS — N400 Enlarged prostate without lower urinary tract symptoms: Secondary | ICD-10-CM | POA: Insufficient documentation

## 2012-05-18 DIAGNOSIS — IMO0002 Reserved for concepts with insufficient information to code with codable children: Secondary | ICD-10-CM | POA: Insufficient documentation

## 2012-05-18 DIAGNOSIS — Z8711 Personal history of peptic ulcer disease: Secondary | ICD-10-CM | POA: Insufficient documentation

## 2012-05-18 DIAGNOSIS — J449 Chronic obstructive pulmonary disease, unspecified: Secondary | ICD-10-CM | POA: Insufficient documentation

## 2012-05-18 DIAGNOSIS — Z7901 Long term (current) use of anticoagulants: Secondary | ICD-10-CM | POA: Insufficient documentation

## 2012-05-18 DIAGNOSIS — M79609 Pain in unspecified limb: Secondary | ICD-10-CM | POA: Insufficient documentation

## 2012-05-18 DIAGNOSIS — Z87891 Personal history of nicotine dependence: Secondary | ICD-10-CM | POA: Insufficient documentation

## 2012-05-18 DIAGNOSIS — M7989 Other specified soft tissue disorders: Secondary | ICD-10-CM

## 2012-05-18 DIAGNOSIS — Z79899 Other long term (current) drug therapy: Secondary | ICD-10-CM | POA: Insufficient documentation

## 2012-05-18 DIAGNOSIS — Z8679 Personal history of other diseases of the circulatory system: Secondary | ICD-10-CM | POA: Insufficient documentation

## 2012-05-18 DIAGNOSIS — E785 Hyperlipidemia, unspecified: Secondary | ICD-10-CM | POA: Insufficient documentation

## 2012-05-18 DIAGNOSIS — I509 Heart failure, unspecified: Secondary | ICD-10-CM | POA: Insufficient documentation

## 2012-05-18 LAB — COMPREHENSIVE METABOLIC PANEL
ALT: 16 U/L (ref 0–53)
BUN: 50 mg/dL — ABNORMAL HIGH (ref 6–23)
CO2: 34 mEq/L — ABNORMAL HIGH (ref 19–32)
Calcium: 9.8 mg/dL (ref 8.4–10.5)
GFR calc Af Amer: 43 mL/min — ABNORMAL LOW (ref >90)
GFR calc non Af Amer: 37 mL/min — ABNORMAL LOW (ref >90)
Glucose, Bld: 207 mg/dL — ABNORMAL HIGH (ref 70–99)
Sodium: 139 mEq/L (ref 135–145)

## 2012-05-18 LAB — CBC WITH DIFFERENTIAL/PLATELET
Eosinophils Relative: 4 % (ref 0–5)
HCT: 36.2 % — ABNORMAL LOW (ref 39.0–52.0)
Hemoglobin: 11.8 g/dL — ABNORMAL LOW (ref 13.0–17.0)
Lymphocytes Relative: 14 % (ref 12–46)
Lymphs Abs: 1.5 10*3/uL (ref 0.7–4.0)
MCV: 88.1 fL (ref 78.0–100.0)
Monocytes Absolute: 0.8 10*3/uL (ref 0.1–1.0)
Monocytes Relative: 7 % (ref 3–12)
Platelets: 242 10*3/uL (ref 150–400)
RBC: 4.11 MIL/uL — ABNORMAL LOW (ref 4.22–5.81)
WBC: 11.3 10*3/uL — ABNORMAL HIGH (ref 4.0–10.5)

## 2012-05-18 MED ORDER — FUROSEMIDE 40 MG PO TABS: 40.0000 mg | ORAL_TABLET | Freq: Two times a day (BID) | ORAL | Status: DC

## 2012-05-18 MED ORDER — FUROSEMIDE 10 MG/ML IJ SOLN
40.0000 mg | Freq: Once | INTRAMUSCULAR | Status: AC
  Administered 2012-05-18: 40 mg via INTRAVENOUS
  Filled 2012-05-18: qty 4

## 2012-05-18 NOTE — ED Provider Notes (Signed)
I evaluated this patient with the resident, Dr. Noe Gens.  On my exam the patient was in no distress, and was in his condition throughout his emergency department stay.  The patient has multiple medical problems, including CHF, has been noncompliant with his medication.  This evaluation did not demonstrate DVT tonight.  Absent distress, the patient was counseled on the need for medication compliance, the need for close followup, and appropriate for discharge.  I saw the ECG, relevant labs and studies - I agree with the interpretation.   Gerhard Munch, MD 05/18/12 2005

## 2012-05-18 NOTE — ED Notes (Signed)
Ordered Heart [facility] Tray

## 2012-05-18 NOTE — ED Notes (Signed)
Lower extremity swelling x 2 weeks ago has not been to dr is on home o2  4 l [place] has had sob also

## 2012-05-18 NOTE — ED Provider Notes (Signed)
History     CSN: [id number]  Arrival date & time 05/18/12  1354   First MD Initiated Contact with Patient 05/18/12 1452      Chief Complaint  Patient presents with  . Leg Swelling    (Consider location/radiation/quality/duration/timing/severity/associated sxs/prior treatment) Patient is a 79 y.o. male presenting with leg pain. The history is provided by the patient, a relative and medical records.  Leg Pain Location:  Leg Time since incident:  2 weeks Injury: no   Leg location:  L leg Pain details:    Quality:  Aching and dull   Radiates to:  Does not radiate   Severity:  Moderate   Onset quality:  Gradual   Duration:  2 weeks   Timing:  Constant   Progression:  Worsening Chronicity:  New Associated symptoms: no fever     Past Medical History  Diagnosis Date  . Emphysema     Oxygen-dependent  . Chronic systolic heart failure   . Implantable cardiac defibrillator infection     Drainage identified July 2012  . ICD (implantable cardiac defibrillator) in place     CRT St Jude; remote - yes  . Nonischemic cardiomyopathy     a. Catheterization 2005 no obstructive coronary disease;  b. 02/2009 Echo: EF 35-40%, Gr 2 DD, mild lvh.  Marland Kitchen HLD (hyperlipidemia)   . Dysrhythmia   . Shortness of breath 11/27/10     "@ rest; sitting up; exerction"  . Hypertension   . Myocardial infarction 1990's  . Peptic ulcer     "bleeding ulcers"  . GI bleeding   . COPD (chronic obstructive pulmonary disease)     a. On 3lpm of O2  . Hyperlipidemia   . BPH (benign prostatic hypertrophy)   . DM (diabetes mellitus)     Past Surgical History  Procedure Laterality Date  . Adenoidectomy    . Doppler echocardiography  2005, 2011  . Cardiac defibrillator placement  11/27/10    "this is my 3rd defibrillator"  . Cervical disc surgery  1990's    "slipped"  . Laceration repair  ~ 1939    right leg  . Tonsillectomy  ~ 1940  . Insert / replace / remove pacemaker  11/27/10    "this is my third  one"  . Eye surgery    . Cataract extraction      "left eye"  . Cardiac catheterization  12/01/2003    Family History  Problem Relation Age of Onset  . Cancer Father   . Cancer Mother     throat  . Lung cancer Daughter     History  Substance Use Topics  . Smoking status: Former Smoker -- 2.00 packs/day    Types: Cigarettes    Quit date: 06/20/2011  . Smokeless tobacco: Never Used     Comment: started at age 39; smoked 1-2.5 ppd; quit 11/11  . Alcohol Use: No      Review of Systems  Constitutional: Negative for fever, chills, activity change and appetite change.  Respiratory: Negative for cough, chest tightness, shortness of breath and wheezing.   Cardiovascular: Positive for leg swelling (left > right). Negative for chest pain and palpitations.  Gastrointestinal: Negative for nausea, vomiting, abdominal pain, diarrhea and constipation.  Skin: Negative for rash and wound.  Neurological: Negative for dizziness, seizures, syncope, weakness and light-headedness.  All other systems reviewed and are negative.    Allergies  Adhesive  Home Medications   Current Outpatient Rx  Name  Route  Sig  Dispense  Refill  . albuterol (ACCUNEB) 1.25 MG/3ML nebulizer solution   Nebulization   Take 1 ampule by nebulization 2 (two) times daily.          . bisacodyl (BISACODYL) 5 MG EC tablet   Oral   Take 10 mg by mouth at bedtime.         . dorzolamide (TRUSOPT) 2 % ophthalmic solution   Both Eyes   Place 1 drop into both eyes 2 (two) times daily.           . Dorzolamide HCl-Timolol Mal (COSOPT OP)   Ophthalmic   Apply 1 drop to eye 2 (two) times daily.           . enalapril (VASOTEC) 10 MG tablet   Oral   Take 1 tablet (10 mg total) by mouth daily.   30 tablet   6     PLEASE RESPOND   . famotidine (PEPCID) 20 MG tablet   Oral   Take 20 mg by mouth 2 (two) times daily.          . fluticasone (FLONASE) 50 MCG/ACT nasal spray   Nasal   Place 2 sprays into  the nose daily.         . Fluticasone-Salmeterol (ADVAIR DISKUS) 250-50 MCG/DOSE AEPB   Inhalation   Inhale 1 puff into the lungs 2 (two) times daily.           . furosemide (LASIX) 40 MG tablet   Oral   Take 40 mg by mouth 2 (two) times daily.         Marland Kitchen glimepiride (AMARYL) 2 MG tablet   Oral   Take 2 mg by mouth 2 (two) times daily.           . iron polysaccharides (NIFEREX) 150 MG capsule   Oral   Take 150 mg by mouth 2 (two) times daily.          . metoprolol (TOPROL-XL) 50 MG 24 hr tablet   Oral   Take 50 mg by mouth daily.           . pantoprazole (PROTONIX) 40 MG tablet   Oral   Take 40 mg by mouth daily.         . potassium chloride (KLOR-CON) 10 MEQ CR tablet   Oral   Take 10 mEq by mouth 2 (two) times daily.           . simvastatin (ZOCOR) 20 MG tablet   Oral   Take 20 mg by mouth at bedtime.           . sitaGLIPtin (JANUVIA) 50 MG tablet   Oral   Take 50 mg by mouth daily.         . Tamsulosin HCl (FLOMAX) 0.4 MG CAPS   Oral   Take 0.4 mg by mouth daily.           Marland Kitchen tiotropium (SPIRIVA) 18 MCG inhalation capsule   Inhalation   Place 18 mcg into inhaler and inhale daily.         . traMADol (ULTRAM) 50 MG tablet   Oral   Take 50 mg by mouth every 6 (six) hours as needed. Maximum dose= 8 tablets per day          . triamcinolone cream (KENALOG) 0.1 %   Topical   Apply topically 2 (two) times daily.         . varenicline (CHANTIX) 0.5 MG  tablet   Oral   Take 0.5 mg by mouth 2 (two) times daily.         Marland Kitchen warfarin (COUMADIN) 5 MG tablet   Oral   Take 5 mg by mouth daily.            BP 114/86  Pulse 85  Temp(Src) 97.9 F (36.6 C) (Oral)  Resp 26  SpO2 97%  Physical Exam  Nursing note and vitals reviewed. Constitutional: He appears well-developed and well-nourished.  HENT:  Head: Normocephalic and atraumatic.  Right Ear: External ear normal.  Left Ear: External ear normal.  Nose: Nose normal.   Mouth/Throat: Oropharynx is clear and moist. No oropharyngeal exudate.  Eyes: Conjunctivae are normal. Pupils are equal, round, and reactive to light.  Neck: Normal range of motion. Neck supple.  Cardiovascular: Normal rate, regular rhythm, normal heart sounds and intact distal pulses.   No murmur heard. Pulmonary/Chest: Effort normal and breath sounds normal. No respiratory distress. He has no wheezes. He has no rales. He exhibits no tenderness.  Abdominal: Soft. Bowel sounds are normal. He exhibits no distension and no mass. There is no tenderness. There is no rebound and no guarding.  Musculoskeletal: Normal range of motion. He exhibits edema (3+ edema in left lower extremity; 1+ edema in right lower extremity).  Neurological: He is alert. He displays normal reflexes. No cranial nerve deficit. He exhibits normal muscle tone. Coordination normal.  Skin: Skin is warm and dry. No rash noted. No erythema. No pallor.  Psychiatric: He has a normal mood and affect. His behavior is normal. Judgment and thought content normal.    ED Course  Procedures (including critical care time)  Labs Reviewed  CBC WITH DIFFERENTIAL - Abnormal; Notable for the following:    WBC 11.3 (*)    RBC 4.11 (*)    Hemoglobin 11.8 (*)    HCT 36.2 (*)    Neutro Abs 8.4 (*)    All other components within normal limits  COMPREHENSIVE METABOLIC PANEL - Abnormal; Notable for the following:    Potassium 5.5 (*)    CO2 34 (*)    Glucose, Bld 207 (*)    BUN 50 (*)    Creatinine, Ser 1.68 (*)    Total Protein 8.6 (*)    GFR calc non Af Amer 37 (*)    GFR calc Af Amer 43 (*)    All other components within normal limits  PROTIME-INR - Abnormal; Notable for the following:    Prothrombin Time 23.2 (*)    INR 2.16 (*)    All other components within normal limits  PRO B NATRIURETIC PEPTIDE   Dg Chest 2 View  05/18/2012  *RADIOLOGY REPORT*  Clinical Data: Shortness of breath, lower extremity swelling  CHEST - 2 VIEW   Comparison: 08/11/2011  Findings: Right-sided dual lead pacer in place.  Borderline cardiomegaly noted with central vascular congestion but no overt edema. Lung volumes are low with crowding of the bronchovascular markings.  No pleural effusion.  No focal pulmonary opacity.  No acute osseous abnormality.  IMPRESSION: Borderline low lung volumes with crowding of the bronchovascular markings particularly at the bases, but no focal acute finding otherwise.   Original Report Authenticated By: Christiana Pellant, M.D.     Date: 05/18/2012  Rate: 73 bpm  Rhythm: normal sinus rhythm  QRS Axis: left  Intervals: normal  ST/T Wave abnormalities: nonspecific ST changes  Conduction Disutrbances:none  Narrative Interpretation: Normal sinus rhythm without evidence of acute ischemia  or hyperkalemia  Old EKG Reviewed: unchanged     1. Acute exacerbation of congestive heart failure   2. Leg edema, left       MDM  79 yo M w/hx of CHF (most recent EF 40% on echo in 2011), on Coumadin for hx of atrial fibrillation, presents for left leg edema of 2 weeks, progressively worsening. Denies chest pain or dyspnea. Not hypoxic on baseline oxygen. Doubt PE. No rales and edema is asymmetric, concerning for DVT, despite his now therapeutic INR. Potassium slightly elevated; however, renal function improved from baseline and no evidence of hyperkalemia on EKG. Differential diagnosis of CHF exacerbation (despite asymmetry of edema, pt has been on left side significantly more than right side-suspect edema could be due to dependency of left side) vs DVT. Plan for ultrasound to rule out DVT.   U/S negative for evidence of DVT. Pt administered IV Lasix (pt and family are unsure if he has been taking it at home) with diuresis. I spoke with patient and his family about whether he would want to spend the night to be observed with his slightly elevated potassium; however, pt states he can follow-up with Dr. Donette Larry soon and will make  sure he now takes his prescribed Lasix. Will prescribe 40mg  PO Lasix bid (as previously prescribed) and have pt follow-up with PCP regarding heart failure. Patient given return precautions, including worsening of signs or symptoms. Patient instructed to follow-up with primary care physician.        Clemetine Marker, MD 05/18/12 [phone]

## 2012-05-18 NOTE — ED Notes (Signed)
Doppler tech at the bedside.  

## 2012-06-15 ENCOUNTER — Encounter: Payer: Self-pay | Admitting: Internal Medicine

## 2012-06-15 ENCOUNTER — Ambulatory Visit (INDEPENDENT_AMBULATORY_CARE_PROVIDER_SITE_OTHER): Payer: Medicare Other | Admitting: *Deleted

## 2012-06-15 DIAGNOSIS — I429 Cardiomyopathy, unspecified: Secondary | ICD-10-CM

## 2012-06-15 DIAGNOSIS — Z95810 Presence of automatic (implantable) cardiac defibrillator: Secondary | ICD-10-CM

## 2012-06-15 LAB — REMOTE PACEMAKER DEVICE
BAMS-0001: 150 {beats}/min
BATTERY VOLTAGE: 2.93 V
LV LEAD THRESHOLD: 1.875 V
VENTRICULAR PACING PM: 90

## 2012-06-21 ENCOUNTER — Encounter: Payer: Self-pay | Admitting: *Deleted

## 2012-07-14 ENCOUNTER — Encounter: Payer: Self-pay | Admitting: Internal Medicine

## 2012-08-14 ENCOUNTER — Encounter (HOSPITAL_COMMUNITY): Payer: Self-pay | Admitting: Emergency Medicine

## 2012-08-14 ENCOUNTER — Emergency Department (HOSPITAL_COMMUNITY): Payer: Medicare Other

## 2012-08-14 ENCOUNTER — Inpatient Hospital Stay (HOSPITAL_COMMUNITY)
Admission: EM | Admit: 2012-08-14 | Discharge: 2012-08-24 | DRG: 339 | Disposition: A | Discharge status: Skilled Nursing Facility | Payer: Medicare Other | Attending: Internal Medicine | Admitting: Internal Medicine

## 2012-08-14 DIAGNOSIS — I429 Cardiomyopathy, unspecified: Secondary | ICD-10-CM

## 2012-08-14 DIAGNOSIS — Z8673 Personal history of transient ischemic attack (TIA), and cerebral infarction without residual deficits: Secondary | ICD-10-CM

## 2012-08-14 DIAGNOSIS — N289 Disorder of kidney and ureter, unspecified: Secondary | ICD-10-CM | POA: Diagnosis not present

## 2012-08-14 DIAGNOSIS — R109 Unspecified abdominal pain: Secondary | ICD-10-CM

## 2012-08-14 DIAGNOSIS — N281 Cyst of kidney, acquired: Secondary | ICD-10-CM

## 2012-08-14 DIAGNOSIS — I129 Hypertensive chronic kidney disease with stage 1 through stage 4 chronic kidney disease, or unspecified chronic kidney disease: Secondary | ICD-10-CM | POA: Diagnosis present

## 2012-08-14 DIAGNOSIS — Z01810 Encounter for preprocedural cardiovascular examination: Secondary | ICD-10-CM

## 2012-08-14 DIAGNOSIS — J449 Chronic obstructive pulmonary disease, unspecified: Secondary | ICD-10-CM

## 2012-08-14 DIAGNOSIS — Z801 Family history of malignant neoplasm of trachea, bronchus and lung: Secondary | ICD-10-CM

## 2012-08-14 DIAGNOSIS — K35209 Acute appendicitis with generalized peritonitis, without abscess, unspecified as to perforation: Principal | ICD-10-CM | POA: Diagnosis present

## 2012-08-14 DIAGNOSIS — K37 Unspecified appendicitis: Secondary | ICD-10-CM

## 2012-08-14 DIAGNOSIS — R1013 Epigastric pain: Secondary | ICD-10-CM

## 2012-08-14 DIAGNOSIS — D649 Anemia, unspecified: Secondary | ICD-10-CM | POA: Diagnosis present

## 2012-08-14 DIAGNOSIS — I252 Old myocardial infarction: Secondary | ICD-10-CM

## 2012-08-14 DIAGNOSIS — K862 Cyst of pancreas: Secondary | ICD-10-CM

## 2012-08-14 DIAGNOSIS — I5022 Chronic systolic (congestive) heart failure: Secondary | ICD-10-CM

## 2012-08-14 DIAGNOSIS — I4891 Unspecified atrial fibrillation: Secondary | ICD-10-CM | POA: Diagnosis present

## 2012-08-14 DIAGNOSIS — Z7901 Long term (current) use of anticoagulants: Secondary | ICD-10-CM

## 2012-08-14 DIAGNOSIS — K352 Acute appendicitis with generalized peritonitis, without abscess: Principal | ICD-10-CM | POA: Diagnosis present

## 2012-08-14 DIAGNOSIS — I509 Heart failure, unspecified: Secondary | ICD-10-CM | POA: Diagnosis present

## 2012-08-14 DIAGNOSIS — I2510 Atherosclerotic heart disease of native coronary artery without angina pectoris: Secondary | ICD-10-CM | POA: Diagnosis present

## 2012-08-14 DIAGNOSIS — I428 Other cardiomyopathies: Secondary | ICD-10-CM | POA: Diagnosis present

## 2012-08-14 DIAGNOSIS — Y836 Removal of other organ (partial) (total) as the cause of abnormal reaction of the patient, or of later complication, without mention of misadventure at the time of the procedure: Secondary | ICD-10-CM | POA: Diagnosis not present

## 2012-08-14 DIAGNOSIS — Z79899 Other long term (current) drug therapy: Secondary | ICD-10-CM

## 2012-08-14 DIAGNOSIS — J9610 Chronic respiratory failure, unspecified whether with hypoxia or hypercapnia: Secondary | ICD-10-CM

## 2012-08-14 DIAGNOSIS — Z9849 Cataract extraction status, unspecified eye: Secondary | ICD-10-CM

## 2012-08-14 DIAGNOSIS — IMO0002 Reserved for concepts with insufficient information to code with codable children: Secondary | ICD-10-CM

## 2012-08-14 DIAGNOSIS — Z800 Family history of malignant neoplasm of digestive organs: Secondary | ICD-10-CM

## 2012-08-14 DIAGNOSIS — Z87891 Personal history of nicotine dependence: Secondary | ICD-10-CM

## 2012-08-14 DIAGNOSIS — N401 Enlarged prostate with lower urinary tract symptoms: Secondary | ICD-10-CM | POA: Diagnosis present

## 2012-08-14 DIAGNOSIS — K529 Noninfective gastroenteritis and colitis, unspecified: Secondary | ICD-10-CM

## 2012-08-14 DIAGNOSIS — E119 Type 2 diabetes mellitus without complications: Secondary | ICD-10-CM | POA: Diagnosis present

## 2012-08-14 DIAGNOSIS — K929 Disease of digestive system, unspecified: Secondary | ICD-10-CM | POA: Diagnosis not present

## 2012-08-14 DIAGNOSIS — Z9981 Dependence on supplemental oxygen: Secondary | ICD-10-CM

## 2012-08-14 DIAGNOSIS — Z95810 Presence of automatic (implantable) cardiac defibrillator: Secondary | ICD-10-CM

## 2012-08-14 DIAGNOSIS — M109 Gout, unspecified: Secondary | ICD-10-CM | POA: Diagnosis present

## 2012-08-14 DIAGNOSIS — H409 Unspecified glaucoma: Secondary | ICD-10-CM | POA: Diagnosis present

## 2012-08-14 DIAGNOSIS — R339 Retention of urine, unspecified: Secondary | ICD-10-CM

## 2012-08-14 DIAGNOSIS — Y9210 Unspecified residential institution as the place of occurrence of the external cause: Secondary | ICD-10-CM | POA: Diagnosis not present

## 2012-08-14 DIAGNOSIS — Z01811 Encounter for preprocedural respiratory examination: Secondary | ICD-10-CM

## 2012-08-14 DIAGNOSIS — E785 Hyperlipidemia, unspecified: Secondary | ICD-10-CM | POA: Diagnosis present

## 2012-08-14 DIAGNOSIS — G4733 Obstructive sleep apnea (adult) (pediatric): Secondary | ICD-10-CM | POA: Diagnosis present

## 2012-08-14 DIAGNOSIS — J438 Other emphysema: Secondary | ICD-10-CM

## 2012-08-14 DIAGNOSIS — J4489 Other specified chronic obstructive pulmonary disease: Secondary | ICD-10-CM

## 2012-08-14 DIAGNOSIS — N138 Other obstructive and reflux uropathy: Secondary | ICD-10-CM | POA: Diagnosis present

## 2012-08-14 DIAGNOSIS — J9819 Other pulmonary collapse: Secondary | ICD-10-CM | POA: Diagnosis present

## 2012-08-14 DIAGNOSIS — N183 Chronic kidney disease, stage 3 unspecified: Secondary | ICD-10-CM | POA: Diagnosis present

## 2012-08-14 DIAGNOSIS — K560 Paralytic ileus: Secondary | ICD-10-CM | POA: Diagnosis not present

## 2012-08-14 HISTORY — DX: Essential (primary) hypertension: I10

## 2012-08-14 HISTORY — DX: Chronic kidney disease, unspecified: N18.9

## 2012-08-14 HISTORY — DX: Atherosclerotic heart disease of native coronary artery without angina pectoris: I25.10

## 2012-08-14 HISTORY — DX: Heart failure, unspecified: I50.9

## 2012-08-14 LAB — CBC WITH DIFFERENTIAL/PLATELET
Basophils Absolute: 0 K/uL (ref 0.0–0.1)
Basophils Relative: 0 % (ref 0–1)
Eosinophils Absolute: 0.2 K/uL (ref 0.0–0.7)
Eosinophils Relative: 1 % (ref 0–5)
HCT: 38.4 % — ABNORMAL LOW (ref 39.0–52.0)
Hemoglobin: 13.1 g/dL (ref 13.0–17.0)
Lymphocytes Relative: 11 % — ABNORMAL LOW (ref 12–46)
Lymphs Abs: 1.6 10*3/uL (ref 0.7–4.0)
MCH: 28.5 pg (ref 26.0–34.0)
MCHC: 34.1 g/dL (ref 30.0–36.0)
MCV: 83.5 fL (ref 78.0–100.0)
Monocytes Absolute: 0.9 K/uL (ref 0.1–1.0)
Monocytes Relative: 6 % (ref 3–12)
Neutro Abs: 11.5 K/uL — ABNORMAL HIGH (ref 1.7–7.7)
Neutrophils Relative %: 82 % — ABNORMAL HIGH (ref 43–77)
Platelets: 135 10*3/uL — ABNORMAL LOW (ref 150–400)
RBC: 4.6 MIL/uL (ref 4.22–5.81)
RDW: 16.1 % — ABNORMAL HIGH (ref 11.5–15.5)
WBC: 14.1 10*3/uL — ABNORMAL HIGH (ref 4.0–10.5)

## 2012-08-14 LAB — COMPREHENSIVE METABOLIC PANEL
ALT: 17 U/L (ref 0–53)
Alkaline Phosphatase: 46 U/L (ref 39–117)
CO2: 26 mEq/L (ref 19–32)
GFR calc Af Amer: 49 mL/min — ABNORMAL LOW (ref >90)
GFR calc non Af Amer: 42 mL/min — ABNORMAL LOW (ref >90)
Glucose, Bld: 189 mg/dL — ABNORMAL HIGH (ref 70–99)
Potassium: 4.1 mEq/L (ref 3.5–5.1)
Sodium: 137 mEq/L (ref 135–145)
Total Protein: 7.9 g/dL (ref 6.0–8.3)

## 2012-08-14 LAB — GLUCOSE, CAPILLARY: Glucose-Capillary: 173 mg/dL — ABNORMAL HIGH (ref 70–99)

## 2012-08-14 LAB — COMPREHENSIVE METABOLIC PANEL WITH GFR
AST: 21 U/L (ref 0–37)
Albumin: 4.3 g/dL (ref 3.5–5.2)
BUN: 22 mg/dL (ref 6–23)
Calcium: 9.1 mg/dL (ref 8.4–10.5)
Chloride: 101 meq/L (ref 96–112)
Creatinine, Ser: 1.51 mg/dL — ABNORMAL HIGH (ref 0.50–1.35)
Total Bilirubin: 0.5 mg/dL (ref 0.3–1.2)

## 2012-08-14 LAB — PROTIME-INR
INR: 1.75 — ABNORMAL HIGH (ref 0.00–1.49)
Prothrombin Time: 19.9 s — ABNORMAL HIGH (ref 11.6–15.2)

## 2012-08-14 LAB — POCT I-STAT TROPONIN I

## 2012-08-14 LAB — LIPASE, BLOOD: Lipase: 36 U/L (ref 11–59)

## 2012-08-14 MED ORDER — PANTOPRAZOLE SODIUM 40 MG IV SOLR
40.0000 mg | Freq: Once | INTRAVENOUS | Status: AC
  Administered 2012-08-15: 40 mg via INTRAVENOUS
  Filled 2012-08-14: qty 40

## 2012-08-14 MED ORDER — FENTANYL CITRATE 0.05 MG/ML IJ SOLN
50.0000 ug | INTRAMUSCULAR | Status: DC | PRN
  Administered 2012-08-15: 50 ug via INTRAVENOUS
  Filled 2012-08-14: qty 2

## 2012-08-14 MED ORDER — ONDANSETRON HCL 4 MG/2ML IJ SOLN
4.0000 mg | Freq: Four times a day (QID) | INTRAMUSCULAR | Status: DC | PRN
  Filled 2012-08-14: qty 2

## 2012-08-14 MED ORDER — SODIUM CHLORIDE 0.9 % IV SOLN
Freq: Once | INTRAVENOUS | Status: AC
  Administered 2012-08-15: 01:00:00 via INTRAVENOUS

## 2012-08-14 NOTE — ED Provider Notes (Signed)
CSN: [id number]     Arrival date & time 08/14/12  2010 History     First MD Initiated Contact with Patient 08/14/12 2202     Chief Complaint  Patient presents with  . Abdominal Pain   (Consider location/radiation/quality/duration/timing/severity/associated sxs/prior Treatment) HPI Comments: Pt was in the middle of eating chicken for lunch around 1PM and pain began prior to him finishing meal.  Pain has persisted with no sig change in pain since then.  Pt did have some nausea, tried to self induce vomiting, couldn't.  No CP, back pain, no radiation of pain from upper epigastrium and RUQ.  No h/o prior abd surgeries in the past.    Patient is a 79 y.o. male presenting with abdominal pain. The history is provided by the patient and a relative.  Abdominal Pain This is a new problem. The current episode started 6 to 12 hours ago. The problem occurs constantly. The problem has not changed since onset.Associated symptoms include abdominal pain. Pertinent negatives include no chest pain. Nothing aggravates the symptoms. Nothing relieves the symptoms. He has tried nothing for the symptoms.    Past Medical History  Diagnosis Date  . Emphysema     Oxygen-dependent  . Chronic systolic heart failure   . Implantable cardiac defibrillator infection     Drainage identified July 2012  . ICD (implantable cardiac defibrillator) in place     CRT St Jude; remote - yes  . Nonischemic cardiomyopathy     a. Catheterization 2005 no obstructive coronary disease;  b. 02/2009 Echo: EF 35-40%, Gr 2 DD, mild lvh.  Marland Kitchen HLD (hyperlipidemia)   . Dysrhythmia   . Shortness of breath 11/27/10     "@ rest; sitting up; exerction"  . Hypertension   . Myocardial infarction 1990's  . Peptic ulcer     "bleeding ulcers"  . GI bleeding   . COPD (chronic obstructive pulmonary disease)     a. On 3lpm of O2  . Hyperlipidemia   . BPH (benign prostatic hypertrophy)   . DM (diabetes mellitus)   . Coronary artery disease     Past Surgical History  Procedure Laterality Date  . Adenoidectomy    . Doppler echocardiography  2005, 2011  . Cardiac defibrillator placement  11/27/10    "this is my 3rd defibrillator"  . Cervical disc surgery  1990's    "slipped"  . Laceration repair  ~ 1939    right leg  . Tonsillectomy  ~ 1940  . Insert / replace / remove pacemaker  11/27/10    "this is my third one"  . Eye surgery    . Cataract extraction      "left eye"  . Cardiac catheterization  12/01/2003   Family History  Problem Relation Age of Onset  . Cancer Father   . Cancer Mother     throat  . Lung cancer Daughter    History  Substance Use Topics  . Smoking status: Former Smoker -- 2.00 packs/day    Types: Cigarettes    Quit date: 06/20/2011  . Smokeless tobacco: Never Used     Comment: started at age 66; smoked 1-2.5 ppd; quit 11/11  . Alcohol Use: No    Review of Systems  Constitutional: Positive for chills and appetite change. Negative for fever.  Cardiovascular: Negative for chest pain.  Gastrointestinal: Positive for nausea and abdominal pain.  Musculoskeletal: Negative for back pain.  All other systems reviewed and are negative.    Allergies  Adhesive  Home Medications   Current Outpatient Rx  Name  Route  Sig  Dispense  Refill  . albuterol (ACCUNEB) 1.25 MG/3ML nebulizer solution   Nebulization   Take 1 ampule by nebulization 2 (two) times daily.          . bisacodyl (BISACODYL) 5 MG EC tablet   Oral   Take 10 mg by mouth at bedtime.         . dorzolamide (TRUSOPT) 2 % ophthalmic solution   Both Eyes   Place 1 drop into both eyes 2 (two) times daily.           . Dorzolamide HCl-Timolol Mal (COSOPT OP)   Ophthalmic   Apply 1 drop to eye 2 (two) times daily.           . enalapril (VASOTEC) 10 MG tablet   Oral   Take 1 tablet (10 mg total) by mouth daily.   30 tablet   6     PLEASE RESPOND   . famotidine (PEPCID) 20 MG tablet   Oral   Take 20 mg by mouth 2  (two) times daily.          . fluticasone (FLONASE) 50 MCG/ACT nasal spray   Nasal   Place 2 sprays into the nose daily.         . Fluticasone-Salmeterol (ADVAIR DISKUS) 250-50 MCG/DOSE AEPB   Inhalation   Inhale 1 puff into the lungs 2 (two) times daily.           . furosemide (LASIX) 40 MG tablet   Oral   Take 1 tablet (40 mg total) by mouth 2 (two) times daily.   60 tablet   0   . glimepiride (AMARYL) 2 MG tablet   Oral   Take 2 mg by mouth 2 (two) times daily.           . iron polysaccharides (NIFEREX) 150 MG capsule   Oral   Take 150 mg by mouth 2 (two) times daily.          . metoprolol (TOPROL-XL) 50 MG 24 hr tablet   Oral   Take 50 mg by mouth daily.           . pantoprazole (PROTONIX) 40 MG tablet   Oral   Take 40 mg by mouth daily.         . potassium chloride (KLOR-CON) 10 MEQ CR tablet   Oral   Take 10 mEq by mouth 2 (two) times daily.           . simvastatin (ZOCOR) 20 MG tablet   Oral   Take 20 mg by mouth at bedtime.           . sitaGLIPtin (JANUVIA) 50 MG tablet   Oral   Take 50 mg by mouth daily.         . Tamsulosin HCl (FLOMAX) 0.4 MG CAPS   Oral   Take 0.4 mg by mouth daily.           Marland Kitchen tiotropium (SPIRIVA) 18 MCG inhalation capsule   Inhalation   Place 18 mcg into inhaler and inhale daily.         . traMADol (ULTRAM) 50 MG tablet   Oral   Take 50 mg by mouth every 6 (six) hours as needed. Maximum dose= 8 tablets per day          . triamcinolone cream (KENALOG) 0.1 %   Topical   Apply topically 2 (  two) times daily.         . varenicline (CHANTIX) 0.5 MG tablet   Oral   Take 0.5 mg by mouth 2 (two) times daily.         Marland Kitchen warfarin (COUMADIN) 5 MG tablet   Oral   Take 5 mg by mouth daily.           BP 146/71  Pulse 64  Temp(Src) 99 F (37.2 C) (Oral)  Resp 14  SpO2 99% Physical Exam  Nursing note and vitals reviewed. Constitutional: He is oriented to person, place, and time. He appears  well-developed and well-nourished. No distress.  HENT:  Head: Normocephalic and atraumatic.  Eyes: Conjunctivae and EOM are normal. No scleral icterus.  Neck: Normal range of motion. Neck supple.  Cardiovascular: Normal rate, regular rhythm and intact distal pulses.   Pulmonary/Chest: Effort normal. No respiratory distress. He has no wheezes.  Abdominal: Soft. He exhibits no distension. There is tenderness in the right upper quadrant and epigastric area. There is guarding and positive Murphy's sign. There is no rebound.  Neurological: He is alert and oriented to person, place, and time. He exhibits normal muscle tone. Coordination normal.  Skin: Skin is warm and dry. No rash noted. He is not diaphoretic.    ED Course   Procedures (including critical care time)  Labs Reviewed  CBC WITH DIFFERENTIAL - Abnormal; Notable for the following:    WBC 14.1 (*)    HCT 38.4 (*)    RDW 16.1 (*)    Platelets 135 (*)    Neutrophils Relative % 82 (*)    Neutro Abs 11.5 (*)    Lymphocytes Relative 11 (*)    All other components within normal limits  COMPREHENSIVE METABOLIC PANEL - Abnormal; Notable for the following:    Glucose, Bld 189 (*)    Creatinine, Ser 1.51 (*)    GFR calc non Af Amer 42 (*)    GFR calc Af Amer 49 (*)    All other components within normal limits  PROTIME-INR - Abnormal; Notable for the following:    Prothrombin Time 19.9 (*)    INR 1.75 (*)    All other components within normal limits  GLUCOSE, CAPILLARY - Abnormal; Notable for the following:    Glucose-Capillary 173 (*)    All other components within normal limits  LIPASE, BLOOD  URINALYSIS, ROUTINE W REFLEX MICROSCOPIC  POCT I-STAT TROPONIN I   Dg Chest 1 View  08/14/2012   *RADIOLOGY REPORT*  Clinical Data: Epigastric abdominal pain and nausea.  CHEST - 1 VIEW  Comparison: 05/18/2012  Findings: Stable appearance of cardiac pacemaker since previous study.  Shallow inspiration. The heart size and pulmonary  vascularity are normal. The lungs appear clear and expanded without focal air space disease or consolidation. No blunting of the costophrenic angles.  No pneumothorax.  Mediastinal contours appear intact.  IMPRESSION: No evidence of active pulmonary disease.   Original Report Authenticated By: Burman Nieves, M.D.   US Abdomen Complete  08/14/2012   *RADIOLOGY REPORT*  Clinical Data:  Abdominal pain  COMPLETE ABDOMINAL ULTRASOUND  Comparison:  CT abdomen dated 02/11/2012  Findings:  Gallbladder:  No gallstones, gallbladder wall thickening, or pericholecystic fluid.  Negative sonographic Murphy's sign.  Common bile duct:  Measures 2 mm.  Liver:  Nodular hepatic contour with heterogeneous parenchymal echotexture, suggesting cirrhosis.  No focal lesion identified.  IVC:  Not visualized.  Pancreas:  Not visualized due to overlying bowel gas.  Spleen:  Measures 4 mm.  Right Kidney:  Measures 10.2 cm.  11 x 13 x 10 mm lateral interpolar cyst.  No hydronephrosis.  Left Kidney:  Measures 10.3 cm.  No mass or hydronephrosis.  Abdominal aorta:  Not visualized.  IMPRESSION: Possible cirrhosis.  13 mm right renal cyst.  Otherwise negative abdominal ultrasound.   Original Report Authenticated By: Charline Bills, M.D.   1. Epigastric abdominal pain   2. Renal cyst     O2 sat is 99% on 3L [place] which is baseline O2 for him and I interpret to be adequate  11:47 PM U/S shows no GB disease.  Pt is still tender, will obtain abd CT scan given age and co morbidities.  Will sign out to Dr. Norlene Campbell for monitoring and follow up of scan.  Pt has not yet had any of meds as he was already getting xray and U/S.      MDM  Pt with upper abd pain while eating, has persisted for >6 hours, suspect biliary colic, may have impacted GB stone.  Will treat symptoms, keep NPO, IVF's, IV fentanyl and zofran, obtain U/S.  WBC is elevated, no fever in the ED but subjective chills at home.  Pt is on coumadin, INR is 1.77.    Gavin Pound.  Remigio Mcmillon, MD 08/14/12 2351

## 2012-08-14 NOTE — ED Notes (Signed)
MD Ghim at bedside. 

## 2012-08-14 NOTE — ED Notes (Signed)
PT. REPORTS MID ABDOMINAL PAIN AFTER EATING CHICKEN THIS AFTERNOON , DENIES NAUSEA /VOMITTING OR DIARRHEA , SLIGHT CONSTIPATION , PT. STATED HISTORY OF CAD / PACEMAKER.

## 2012-08-14 NOTE — ED Notes (Signed)
Patient transported to Ultrasound 

## 2012-08-15 ENCOUNTER — Encounter (HOSPITAL_COMMUNITY): Payer: Self-pay | Admitting: Radiology

## 2012-08-15 ENCOUNTER — Emergency Department (HOSPITAL_COMMUNITY): Payer: Medicare Other

## 2012-08-15 DIAGNOSIS — Z01811 Encounter for preprocedural respiratory examination: Secondary | ICD-10-CM

## 2012-08-15 DIAGNOSIS — R109 Unspecified abdominal pain: Secondary | ICD-10-CM

## 2012-08-15 DIAGNOSIS — Z01810 Encounter for preprocedural cardiovascular examination: Secondary | ICD-10-CM

## 2012-08-15 DIAGNOSIS — R1013 Epigastric pain: Secondary | ICD-10-CM

## 2012-08-15 DIAGNOSIS — Z95810 Presence of automatic (implantable) cardiac defibrillator: Secondary | ICD-10-CM

## 2012-08-15 DIAGNOSIS — I429 Cardiomyopathy, unspecified: Secondary | ICD-10-CM

## 2012-08-15 DIAGNOSIS — J449 Chronic obstructive pulmonary disease, unspecified: Secondary | ICD-10-CM

## 2012-08-15 DIAGNOSIS — J438 Other emphysema: Secondary | ICD-10-CM

## 2012-08-15 DIAGNOSIS — I5022 Chronic systolic (congestive) heart failure: Secondary | ICD-10-CM

## 2012-08-15 DIAGNOSIS — J9610 Chronic respiratory failure, unspecified whether with hypoxia or hypercapnia: Secondary | ICD-10-CM

## 2012-08-15 LAB — GLUCOSE, CAPILLARY
Glucose-Capillary: 146 mg/dL — ABNORMAL HIGH (ref 70–99)
Glucose-Capillary: 140 mg/dL — ABNORMAL HIGH (ref 70–99)
Glucose-Capillary: 126 mg/dL — ABNORMAL HIGH (ref 70–99)
Glucose-Capillary: 207 mg/dL — ABNORMAL HIGH (ref 70–99)

## 2012-08-15 LAB — CBC WITH DIFFERENTIAL/PLATELET
Basophils Absolute: 0 10*3/uL (ref 0.0–0.1)
Eosinophils Absolute: 0 10*3/uL (ref 0.0–0.7)
Eosinophils Relative: 0 % (ref 0–5)
HCT: 36.4 % — ABNORMAL LOW (ref 39.0–52.0)
Lymphs Abs: 0.7 10*3/uL (ref 0.7–4.0)
MCH: 27.4 pg (ref 26.0–34.0)
MCV: 85.8 fL (ref 78.0–100.0)
Monocytes Absolute: 1 10*3/uL (ref 0.1–1.0)
Platelets: 121 10*3/uL — ABNORMAL LOW (ref 150–400)
RDW: 16.4 % — ABNORMAL HIGH (ref 11.5–15.5)

## 2012-08-15 LAB — URINALYSIS, ROUTINE W REFLEX MICROSCOPIC
Bilirubin Urine: NEGATIVE
Glucose, UA: NEGATIVE mg/dL
Hgb urine dipstick: NEGATIVE
Ketones, ur: NEGATIVE mg/dL
Leukocytes, UA: NEGATIVE
Nitrite: NEGATIVE
Protein, ur: NEGATIVE mg/dL
Specific Gravity, Urine: 1.016 (ref 1.005–1.030)
Urobilinogen, UA: 0.2 mg/dL (ref 0.0–1.0)
pH: 5 (ref 5.0–8.0)

## 2012-08-15 LAB — HEPATIC FUNCTION PANEL
ALT: 15 U/L (ref 0–53)
Alkaline Phosphatase: 45 U/L (ref 39–117)
Bilirubin, Direct: 0.2 mg/dL (ref 0.0–0.3)
Indirect Bilirubin: 0.6 mg/dL (ref 0.3–0.9)
Total Protein: 6.8 g/dL (ref 6.0–8.3)

## 2012-08-15 LAB — TYPE AND SCREEN: ABO/RH(D): O POS

## 2012-08-15 LAB — BASIC METABOLIC PANEL
CO2: 25 mEq/L (ref 19–32)
Calcium: 8.4 mg/dL (ref 8.4–10.5)
Chloride: 103 mEq/L (ref 96–112)
Creatinine, Ser: 1.49 mg/dL — ABNORMAL HIGH (ref 0.50–1.35)
Glucose, Bld: 128 mg/dL — ABNORMAL HIGH (ref 70–99)

## 2012-08-15 MED ORDER — DOCUSATE SODIUM 100 MG PO CAPS
200.0000 mg | ORAL_CAPSULE | Freq: Two times a day (BID) | ORAL | Status: DC
  Administered 2012-08-15 – 2012-08-24 (×13): 200 mg via ORAL
  Filled 2012-08-15 (×3): qty 1
  Filled 2012-08-15: qty 2
  Filled 2012-08-15: qty 1
  Filled 2012-08-15 (×2): qty 2
  Filled 2012-08-15 (×2): qty 1
  Filled 2012-08-15: qty 2
  Filled 2012-08-15: qty 1
  Filled 2012-08-15: qty 2
  Filled 2012-08-15 (×2): qty 1
  Filled 2012-08-15: qty 2
  Filled 2012-08-15: qty 1
  Filled 2012-08-15: qty 2
  Filled 2012-08-15: qty 1
  Filled 2012-08-15 (×3): qty 2
  Filled 2012-08-15: qty 1

## 2012-08-15 MED ORDER — IOHEXOL 300 MG/ML  SOLN
80.0000 mL | Freq: Once | INTRAMUSCULAR | Status: AC | PRN
  Administered 2012-08-15: 100 mL via INTRAVENOUS

## 2012-08-15 MED ORDER — ACETAMINOPHEN 325 MG PO TABS: 650.0000 mg | ORAL_TABLET | Freq: Four times a day (QID) | ORAL | Status: DC | PRN

## 2012-08-15 MED ORDER — PIPERACILLIN-TAZOBACTAM 3.375 G IVPB
3.3750 g | Freq: Three times a day (TID) | INTRAVENOUS | Status: DC
  Administered 2012-08-15 – 2012-08-23 (×25): 3.375 g via INTRAVENOUS
  Filled 2012-08-15 (×29): qty 50

## 2012-08-15 MED ORDER — POLYETHYLENE GLYCOL 3350 17 G PO PACK
17.0000 g | PACK | Freq: Every day | ORAL | Status: DC
  Administered 2012-08-15 – 2012-08-24 (×7): 17 g via ORAL
  Filled 2012-08-15 (×10): qty 1

## 2012-08-15 MED ORDER — INSULIN ASPART 100 UNIT/ML ~~LOC~~ SOLN
0.0000 [IU] | SUBCUTANEOUS | Status: DC
  Administered 2012-08-15 (×2): 1 [IU] via SUBCUTANEOUS
  Administered 2012-08-15: 2 [IU] via SUBCUTANEOUS
  Administered 2012-08-15: 1 [IU] via SUBCUTANEOUS
  Administered 2012-08-16: 2 [IU] via SUBCUTANEOUS
  Administered 2012-08-16: 1 [IU] via SUBCUTANEOUS
  Administered 2012-08-17 (×2): 2 [IU] via SUBCUTANEOUS
  Administered 2012-08-17 (×4): 1 [IU] via SUBCUTANEOUS
  Administered 2012-08-18: 2 [IU] via SUBCUTANEOUS
  Administered 2012-08-18 (×2): 1 [IU] via SUBCUTANEOUS
  Administered 2012-08-18: 21:00:00 via SUBCUTANEOUS
  Administered 2012-08-18: 2 [IU] via SUBCUTANEOUS
  Administered 2012-08-18: 1 [IU] via SUBCUTANEOUS
  Administered 2012-08-19 (×2): 2 [IU] via SUBCUTANEOUS
  Administered 2012-08-19: 1 [IU] via SUBCUTANEOUS
  Administered 2012-08-19: 01:00:00 via SUBCUTANEOUS
  Administered 2012-08-20 (×2): 1 [IU] via SUBCUTANEOUS
  Administered 2012-08-20: 04:00:00 via SUBCUTANEOUS
  Administered 2012-08-21 – 2012-08-22 (×6): 2 [IU] via SUBCUTANEOUS
  Administered 2012-08-22: 1 [IU] via SUBCUTANEOUS

## 2012-08-15 MED ORDER — ACETAMINOPHEN 650 MG RE SUPP: 650.0000 mg | Freq: Four times a day (QID) | RECTAL | Status: DC | PRN

## 2012-08-15 MED ORDER — MORPHINE SULFATE 2 MG/ML IJ SOLN
1.0000 mg | INTRAMUSCULAR | Status: DC | PRN
  Administered 2012-08-17: 1 mg via INTRAVENOUS
  Filled 2012-08-15: qty 1

## 2012-08-15 MED ORDER — METOPROLOL TARTRATE 1 MG/ML IV SOLN
2.5000 mg | Freq: Four times a day (QID) | INTRAVENOUS | Status: DC
  Administered 2012-08-15 – 2012-08-20 (×18): 2.5 mg via INTRAVENOUS
  Administered 2012-08-20: 07:00:00 via INTRAVENOUS
  Administered 2012-08-20: 2.5 mg via INTRAVENOUS
  Administered 2012-08-20: 02:00:00 via INTRAVENOUS
  Administered 2012-08-21 – 2012-08-24 (×15): 2.5 mg via INTRAVENOUS
  Filled 2012-08-15 (×41): qty 5

## 2012-08-15 MED ORDER — ONDANSETRON HCL 4 MG/2ML IJ SOLN
4.0000 mg | Freq: Four times a day (QID) | INTRAMUSCULAR | Status: DC | PRN
  Administered 2012-08-18 – 2012-08-20 (×3): 4 mg via INTRAVENOUS
  Filled 2012-08-15 (×3): qty 2

## 2012-08-15 MED ORDER — SODIUM CHLORIDE 0.9 % IV SOLN
INTRAVENOUS | Status: AC
  Administered 2012-08-15: 10 mL via INTRAVENOUS
  Administered 2012-08-17: 07:00:00 via INTRAVENOUS

## 2012-08-15 MED ORDER — BUDESONIDE 0.25 MG/2ML IN SUSP
0.2500 mg | Freq: Two times a day (BID) | RESPIRATORY_TRACT | Status: DC
  Administered 2012-08-15 – 2012-08-24 (×18): 0.25 mg via RESPIRATORY_TRACT
  Filled 2012-08-15 (×21): qty 2

## 2012-08-15 MED ORDER — ONDANSETRON HCL 4 MG PO TABS: 4.0000 mg | ORAL_TABLET | Freq: Four times a day (QID) | ORAL | Status: DC | PRN

## 2012-08-15 MED ORDER — DORZOLAMIDE HCL-TIMOLOL MAL 2-0.5 % OP SOLN
1.0000 [drp] | Freq: Two times a day (BID) | OPHTHALMIC | Status: DC
  Administered 2012-08-15 – 2012-08-24 (×19): 1 [drp] via OPHTHALMIC
  Filled 2012-08-15: qty 10

## 2012-08-15 MED ORDER — INSULIN ASPART 100 UNIT/ML ~~LOC~~ SOLN: 0.0000 [IU] | Freq: Three times a day (TID) | SUBCUTANEOUS | Status: DC

## 2012-08-15 MED ORDER — IOHEXOL 300 MG/ML  SOLN
20.0000 mL | INTRAMUSCULAR | Status: DC
  Administered 2012-08-15: 20 mL via ORAL

## 2012-08-15 MED ORDER — FLUTICASONE PROPIONATE 50 MCG/ACT NA SUSP
2.0000 | Freq: Every day | NASAL | Status: DC
  Administered 2012-08-15 – 2012-08-24 (×10): 2 via NASAL
  Filled 2012-08-15: qty 16

## 2012-08-15 MED ORDER — SODIUM CHLORIDE 0.9 % IJ SOLN
3.0000 mL | Freq: Two times a day (BID) | INTRAMUSCULAR | Status: DC
  Administered 2012-08-15 – 2012-08-21 (×6): 3 mL via INTRAVENOUS

## 2012-08-15 NOTE — H&P (Addendum)
[facility] History and Physical  Robert Davies ZOX:[id number] DOB: [birth year] DOA: 08/14/2012  Referring physician: ER physician. PCP: Georgann Housekeeper, MD   Chief Complaint: Abdominal pain.  HPI: Robert Davies is a 79 y.o. male history of cardiomyopathy status post AICD placement, COPD and home oxygen, diabetes mellitus, hyperlipidemia, hypertension and on Coumadin probably for A. fib presents with complaints of abdominal pain. Patient started developed epigastric pain  after dinner. CT abdomen and pelvis shows features concerning for early appendicitis and colitis. Surgeon on call Dr. Magnus Ivan was consulted and at this time has advised admission. Patient denies any nausea vomiting or diarrhea or any fever or chills. Patient's pain is dull constant and sometimes radiating to right lower quadrant. Denies any chest pain or shortness of breath.  Review of Systems: As presented in the history of presenting illness, rest negative.  Past Medical History  Diagnosis Date  . Emphysema     Oxygen-dependent  . Chronic systolic heart failure   . Implantable cardiac defibrillator infection     Drainage identified July 2012  . ICD (implantable cardiac defibrillator) in place     CRT St Jude; remote - yes  . Nonischemic cardiomyopathy     a. Catheterization 2005 no obstructive coronary disease;  b. 02/2009 Echo: EF 35-40%, Gr 2 DD, mild lvh.  Marland Kitchen HLD (hyperlipidemia)   . Dysrhythmia   . Shortness of breath 11/27/10     "@ rest; sitting up; exerction"  . Hypertension   . Myocardial infarction 1990's  . Peptic ulcer     "bleeding ulcers"  . GI bleeding   . COPD (chronic obstructive pulmonary disease)     a. On 3lpm of O2  . Hyperlipidemia   . BPH (benign prostatic hypertrophy)   . DM (diabetes mellitus)   . Coronary artery disease    Past Surgical History  Procedure Laterality Date  . Adenoidectomy    . Doppler echocardiography  2005, 2011  . Cardiac defibrillator placement   11/27/10    "this is my 3rd defibrillator"  . Cervical disc surgery  1990's    "slipped"  . Laceration repair  ~ 1939    right leg  . Tonsillectomy  ~ 1940  . Insert / replace / remove pacemaker  11/27/10    "this is my third one"  . Eye surgery    . Cataract extraction      "left eye"  . Cardiac catheterization  12/01/2003   Social History:  reports that he quit smoking about 13 months ago. His smoking use included Cigarettes. He smoked 2.00 packs per day. He has never used smokeless tobacco. He reports that he does not drink alcohol or use illicit drugs. Home. where does patient live-- Can do ADLs. Can patient participate in ADLs?  Allergies  Allergen Reactions  . Adhesive (Tape)     Break out    Family History  Problem Relation Age of Onset  . Cancer Father   . Cancer Mother     throat  . Lung cancer Daughter       Prior to Admission medications   Medication Sig Start Date End Date Taking? Authorizing Provider  albuterol (ACCUNEB) 1.25 MG/3ML nebulizer solution Take 1 ampule by nebulization 2 (two) times daily.     Historical Provider, MD  bisacodyl (BISACODYL) 5 MG EC tablet Take 10 mg by mouth at bedtime.    Historical Provider, MD  dorzolamide (TRUSOPT) 2 % ophthalmic solution Place 1 drop into both eyes 2 (two)  times daily.      Historical Provider, MD  Dorzolamide HCl-Timolol Mal (COSOPT OP) Apply 1 drop to eye 2 (two) times daily.      Historical Provider, MD  enalapril (VASOTEC) 10 MG tablet Take 1 tablet (10 mg total) by mouth daily. 12/10/11   Marinus Maw, MD  famotidine (PEPCID) 20 MG tablet Take 20 mg by mouth 2 (two) times daily.     Historical Provider, MD  fluticasone (FLONASE) 50 MCG/ACT nasal spray Place 2 sprays into the nose daily.    Historical Provider, MD  Fluticasone-Salmeterol (ADVAIR DISKUS) 250-50 MCG/DOSE AEPB Inhale 1 puff into the lungs 2 (two) times daily.      Historical Provider, MD  furosemide (LASIX) 40 MG tablet Take 1 tablet (40 mg  total) by mouth 2 (two) times daily. 05/18/12   Clemetine Marker, MD  glimepiride (AMARYL) 2 MG tablet Take 2 mg by mouth 2 (two) times daily.      Historical Provider, MD  iron polysaccharides (NIFEREX) 150 MG capsule Take 150 mg by mouth 2 (two) times daily.     Historical Provider, MD  metoprolol (TOPROL-XL) 50 MG 24 hr tablet Take 50 mg by mouth daily.      Historical Provider, MD  pantoprazole (PROTONIX) 40 MG tablet Take 40 mg by mouth daily.    Historical Provider, MD  potassium chloride (KLOR-CON) 10 MEQ CR tablet Take 10 mEq by mouth 2 (two) times daily.      Historical Provider, MD  simvastatin (ZOCOR) 20 MG tablet Take 20 mg by mouth at bedtime.      Historical Provider, MD  sitaGLIPtin (JANUVIA) 50 MG tablet Take 50 mg by mouth daily.    Historical Provider, MD  Tamsulosin HCl (FLOMAX) 0.4 MG CAPS Take 0.4 mg by mouth daily.      Historical Provider, MD  tiotropium (SPIRIVA) 18 MCG inhalation capsule Place 18 mcg into inhaler and inhale daily.    Historical Provider, MD  traMADol (ULTRAM) 50 MG tablet Take 50 mg by mouth every 6 (six) hours as needed. Maximum dose= 8 tablets per day     Historical Provider, MD  triamcinolone cream (KENALOG) 0.1 % Apply topically 2 (two) times daily.    Historical Provider, MD  varenicline (CHANTIX) 0.5 MG tablet Take 0.5 mg by mouth 2 (two) times daily.    Historical Provider, MD  warfarin (COUMADIN) 5 MG tablet Take 5 mg by mouth daily.     Historical Provider, MD   Physical Exam: Filed Vitals:   08/15/12 0200 08/15/12 0250 08/15/12 0414 08/15/12 0417  BP: 132/66 140/67 126/82   Pulse: 71 71 74   Temp:  99.4 F (37.4 C)  100.2 F (37.9 C)  TempSrc:  Oral    Resp:  22 20   SpO2: 98% 98% 100%      General:  Well-developed well-nourished.  Eyes: Anicteric no pallor.  ENT: No discharge from ears eyes nose mouth.  Neck: No mass felt.  Cardiovascular: S1-S2 heard.  Respiratory: No rhonchi or crepitations.  Abdomen: Mild tenderness in the  epigastric and lower quadrants.  Skin: No rash.  Musculoskeletal: No edema.  Psychiatric: Appears normal.  Neurologic: Alert awake oriented to time place and person. Moves all extremities.  Labs on Admission:  Basic Metabolic Panel:  Recent Labs Lab 08/14/12 2035  NA 137  K 4.1  CL 101  CO2 26  GLUCOSE 189*  BUN 22  CREATININE 1.51*  CALCIUM 9.1   Liver Function  Tests:  Recent Labs Lab 08/14/12 2035  AST 21  ALT 17  ALKPHOS 46  BILITOT 0.5  PROT 7.9  ALBUMIN 4.3    Recent Labs Lab 08/14/12 2035  LIPASE 36   No results found for this basename: AMMONIA,  in the last 168 hours CBC:  Recent Labs Lab 08/14/12 2035  WBC 14.1*  NEUTROABS 11.5*  HGB 13.1  HCT 38.4*  MCV 83.5  PLT 135*   Cardiac Enzymes: No results found for this basename: CKTOTAL, CKMB, CKMBINDEX, TROPONINI,  in the last 168 hours  BNP (last 3 results)  Recent Labs  05/18/12 1445  PROBNP 105.8   CBG:  Recent Labs Lab 08/14/12 2242  GLUCAP 173*    Radiological Exams on Admission: Dg Chest 1 View  08/14/2012   *RADIOLOGY REPORT*  Clinical Data: Epigastric abdominal pain and nausea.  CHEST - 1 VIEW  Comparison: 05/18/2012  Findings: Stable appearance of cardiac pacemaker since previous study.  Shallow inspiration. The heart size and pulmonary vascularity are normal. The lungs appear clear and expanded without focal air space disease or consolidation. No blunting of the costophrenic angles.  No pneumothorax.  Mediastinal contours appear intact.  IMPRESSION: No evidence of active pulmonary disease.   Original Report Authenticated By: Burman Nieves, M.D.   US Abdomen Complete  08/14/2012   *RADIOLOGY REPORT*  Clinical Data:  Abdominal pain  COMPLETE ABDOMINAL ULTRASOUND  Comparison:  CT abdomen dated 02/11/2012  Findings:  Gallbladder:  No gallstones, gallbladder wall thickening, or pericholecystic fluid.  Negative sonographic Murphy's sign.  Common bile duct:  Measures 2 mm.  Liver:   Nodular hepatic contour with heterogeneous parenchymal echotexture, suggesting cirrhosis.  No focal lesion identified.  IVC:  Not visualized.  Pancreas:  Not visualized due to overlying bowel gas.  Spleen:  Measures 4 mm.  Right Kidney:  Measures 10.2 cm.  11 x 13 x 10 mm lateral interpolar cyst.  No hydronephrosis.  Left Kidney:  Measures 10.3 cm.  No mass or hydronephrosis.  Abdominal aorta:  Not visualized.  IMPRESSION: Possible cirrhosis.  13 mm right renal cyst.  Otherwise negative abdominal ultrasound.   Original Report Authenticated By: Charline Bills, M.D.   Ct Abdomen Pelvis W Contrast  08/15/2012   *RADIOLOGY REPORT*  Clinical Data: Mid abdominal pain after eating chicken. Constipation.  CT ABDOMEN AND PELVIS WITH CONTRAST  Technique:  Multidetector CT imaging of the abdomen and pelvis was performed following the standard protocol during bolus administration of intravenous contrast.  Contrast: OMNIPAQUE IOHEXOL 300 MG/ML  SOLN  Comparison: 02/11/2012  Findings: Mild dependent atelectasis in the lung bases.  The liver, spleen, gallbladder, adrenal glands, inferior vena cava, and retroperitoneal lymph nodes are unremarkable.  Calcification and thrombus in the abdominal aorta without aneurysm.  Small sub centimeter cysts in the kidneys appear stable.  No solid mass or hydronephrosis is identified.  Simple appearing cystic lesion in the body of the pancreas measuring 2 cm diameter.  This is stable in size and appearance on studies dating back to 04/25/2011.  6- month follow-up CT or MRI is recommended to confirm stability.  The gastric wall is not thickened.  Small bowel are not distended. Colon is mostly decompressed.  The right colon is fluid-filled and may demonstrate mild wall thickening although this could be due to incomplete distension.  There is no pericolonic infiltration.  Mild colitis is not excluded. No free air or free fluid in the abdomen. Abdominal wall musculature appears intact.   Pelvis:  Calcification in the prostate gland.  Bladder wall is not thickened.  No free or loculated pelvic fluid collections. Rectosigmoid colon is decompressed without evidence of diverticulitis.  The appendix is fluid-filled and demonstrates borderline caliber at 8-11 mm diameter.  There is a small appendicolith at the base.  Minimal infiltration around the appendix.  Changes are nonspecific but could reflect early acute appendicitis.  No evidence of rupture or abscess.  No significant pelvic lymphadenopathy.  Normal alignment of the lumbar spine with mild degenerative changes.  IMPRESSION:  1.  Stable 2 cm diameter cyst in the body of the pancreas.  18-month follow-up is recommended. 2.  Mild thickening of the right colon wall versus under distension.  Mild colitis is not excluded. 3.  Mildly distended fluid filled appendix with minimal inflammatory stranding.  Changes are nonspecific but suggest early acute appendicitis.  Correlation with physical examination and laboratory values is recommended.   Original Report Authenticated By: Burman Nieves, M.D.    EKG: Independently reviewed. Paced rhythm.  Assessment/Plan Principal Problem:   Abdominal pain Active Problems:   nonischemic cardiomyopathy    SYSTOLIC HEART FAILURE, CHRONIC   COPD   1. Abdominal pain most likely from early appendicitis with possible colitis - patient has been placed on IV antibiotics and n.p.o. and pain in the medications. Surgery on board. Surgery has requested pulmonary and cardiology consults and clearance from them before any surgical procedure. 2. Cardiomyopathy status post AICD placement - we'll hold Lasix for now and closely monitor respiratory status. Consult cardiology. 3. COPD on home oxygen - continue inhalers. Presently not wheezing. Consult pulmonology as requested by surgery. 4. Mild renal failure - closely observe intake output and metabolic panel. 5. On Coumadin possibly for A. Fib - in anticipation of  possible surgery Coumadin is on hold and presently INR is subtherapeutic in recheck INR now. Type and screen. 6. Diabetes mellitus type 2 - closely follow CBGs with sliding-scale. 7. Hypertension - patient has been placed on scheduled and Toprol 2.5 mg IV every 6 hourly. 8. Pancreatic cyst - will need repeat CT abdomen within 6 months.  Consult patient's cardiologist Dr. Ladona Ridgel in a.m. and pulmonary as requested by surgery for clearance.  Code Status: Full code.  Family Communication: None.  Disposition Plan: Admit to inpatient.    KAKRAKANDY,ARSHAD N. [facility] Pager [phone].  If 7PM-7AM, please contact night-coverage [email or web address] Password [facility] 08/15/2012, 4:57 AM

## 2012-08-15 NOTE — Progress Notes (Signed)
Admit Pt from ED VSS Pt resting environment secured

## 2012-08-15 NOTE — ED Notes (Signed)
Found the pt zosyn disconnected in the bed when only a small amount had infused.  New bag hung

## 2012-08-15 NOTE — ED Notes (Signed)
rn from [id number] called to get report but i was on the phone giving report to  Another floor.  i just called [id number] the rn is in  A pts room will have to call me back

## 2012-08-15 NOTE — Consult Note (Signed)
CARDIOLOGY CONSULT NOTE  Patient ID: Robert Davies, MRN: [id number], DOB/AGE: [birth year] 77 y.o. Admit date: 08/14/2012 Date of Consult: 08/15/2012  Primary Physician: Georgann Housekeeper, MD Primary Cardiologist: Verdis Prime, MD with [name] Primary Electrophysiologist: Lewayne Bunting, MD  Chief Complaint: Abdominal pain Reason for Consult: Preop clearance  79 year old male, with history of nonischemic cardiomyopathy, chronic systolic heart failure, followed by Dr. Verdis Prime, and status post ICD implantation, followed by Dr. Sharrell Ku, now referred for preoperative cardiac clearance.  Patient presented to the ED yesterday with abdominal pain, and has been diagnosed with possible early appendicitis. Given his multiple comorbidities, however, he has been deemed to be a very poor surgical candidate, and is being treated conservatively with IV antibiotics.  From a cardiac standpoint, however, patient has been quite stable, denying any interim development of chest pain, decompensated heart failure, tachycardia palpitations, or ICD discharge.  Past Medical History  Diagnosis Date  . Emphysema     Oxygen-dependent  . Chronic systolic heart failure   . Implantable cardiac defibrillator infection     Drainage identified July 2012  . ICD (implantable cardiac defibrillator) in place     CRT St Jude; remote - yes  . Nonischemic cardiomyopathy     a. Catheterization 2005 no obstructive coronary disease;  b. 02/2009 Echo: EF 35-40%, Gr 2 DD, mild lvh.  Marland Kitchen HLD (hyperlipidemia)   . Dysrhythmia   . Shortness of breath 11/27/10     "@ rest; sitting up; exerction"  . Hypertension   . Myocardial infarction 1990's  . Peptic ulcer     "bleeding ulcers"  . GI bleeding   . COPD (chronic obstructive pulmonary disease)     a. On 3lpm of O2  . Hyperlipidemia   . BPH (benign prostatic hypertrophy)   . DM (diabetes mellitus)   . Coronary artery disease      Surgical History:  Past Surgical History    Procedure Laterality Date  . Adenoidectomy    . Doppler echocardiography  2005, 2011  . Cardiac defibrillator placement  11/27/10    "this is my 3rd defibrillator"  . Cervical disc surgery  1990's    "slipped"  . Laceration repair  ~ 1939    right leg  . Tonsillectomy  ~ 1940  . Insert / replace / remove pacemaker  11/27/10    "this is my third one"  . Eye surgery    . Cataract extraction      "left eye"  . Cardiac catheterization  12/01/2003     Home Meds: Prior to Admission medications   Medication Sig Start Date End Date Taking? Authorizing Provider  albuterol (ACCUNEB) 1.25 MG/3ML nebulizer solution Take 1 ampule by nebulization 2 (two) times daily.    Yes Historical Provider, MD  bisacodyl (BISACODYL) 5 MG EC tablet Take 10 mg by mouth at bedtime.   Yes Historical Provider, MD  colchicine 0.6 MG tablet Take 0.6 mg by mouth daily.   Yes Historical Provider, MD  Dorzolamide HCl-Timolol Mal (COSOPT OP) Apply 1 drop to eye 2 (two) times daily.    Yes Historical Provider, MD  enalapril (VASOTEC) 10 MG tablet Take 1 tablet (10 mg total) by mouth daily. 12/10/11  Yes Marinus Maw, MD  famotidine (PEPCID) 20 MG tablet Take 20 mg by mouth 2 (two) times daily.    Yes Historical Provider, MD  fluticasone (FLONASE) 50 MCG/ACT nasal spray Place 2 sprays into the nose daily.   Yes Historical Provider, MD  Fluticasone-Salmeterol (  ADVAIR DISKUS) 250-50 MCG/DOSE AEPB Inhale 1 puff into the lungs 2 (two) times daily.     Yes Historical Provider, MD  furosemide (LASIX) 40 MG tablet Take 40 mg by mouth daily.   Yes Historical Provider, MD  glimepiride (AMARYL) 2 MG tablet Take 2 mg by mouth 2 (two) times daily.     Yes Historical Provider, MD  metoprolol (TOPROL-XL) 50 MG 24 hr tablet Take 50 mg by mouth daily.     Yes Historical Provider, MD  pantoprazole (PROTONIX) 40 MG tablet Take 40 mg by mouth daily.   Yes Historical Provider, MD  polyethylene glycol (MIRALAX / GLYCOLAX) packet Take 17 g by  mouth daily as needed (for constipation).   Yes Historical Provider, MD  potassium chloride (KLOR-CON) 10 MEQ CR tablet Take 10 mEq by mouth 2 (two) times daily.     Yes Historical Provider, MD  simvastatin (ZOCOR) 20 MG tablet Take 20 mg by mouth at bedtime.     Yes Historical Provider, MD  sitaGLIPtin (JANUVIA) 50 MG tablet Take 50 mg by mouth daily.   Yes Historical Provider, MD  Tamsulosin HCl (FLOMAX) 0.4 MG CAPS Take 0.4 mg by mouth daily.     Yes Historical Provider, MD  tiotropium (SPIRIVA) 18 MCG inhalation capsule Place 18 mcg into inhaler and inhale daily.   Yes Historical Provider, MD  varenicline (CHANTIX) 0.5 MG tablet Take 0.5 mg by mouth 2 (two) times daily.   Yes Historical Provider, MD  warfarin (COUMADIN) 5 MG tablet Take 5 mg by mouth daily.    Yes Historical Provider, MD    Inpatient Medications:  . budesonide (PULMICORT) nebulizer solution  0.25 mg Nebulization BID  . dorzolamide-timolol  1 drop Both Eyes BID  . fluticasone  2 spray Each Nare Daily  . insulin aspart  0-9 Units Subcutaneous Q4H  . metoprolol  2.5 mg Intravenous Q6H  . piperacillin-tazobactam (ZOSYN)  IV  3.375 g Intravenous Q8H  . sodium chloride  3 mL Intravenous Q12H    Allergies:  Allergies  Allergen Reactions  . Adhesive (Tape)     Break out    History   Social History  . Marital Status: Widowed    Spouse Name: N/A    Number of Children: N/A  . Years of Education: N/A   Occupational History  . Not on file.   Social History Main Topics  . Smoking status: Former Smoker -- 2.00 packs/day    Types: Cigarettes    Quit date: 06/20/2011  . Smokeless tobacco: Never Used     Comment: started at age 35; smoked 1-2.5 ppd; quit 11/11  . Alcohol Use: No  . Drug Use: No  . Sexually Active: Not on file   Other Topics Concern  . Not on file   Social History Narrative   Divorced, retired Naval architect.      Family History  Problem Relation Age of Onset  . Cancer Father   . Cancer  Mother     throat  . Lung cancer Daughter      Review of Systems: General: negative for chills, fever, night sweats or weight changes.  Cardiovascular: negative for chest pain, edema, orthopnea, palpitations, paroxysmal nocturnal dyspnea, shortness of breath or dyspnea on exertion Dermatological: negative for rash Respiratory: negative for cough or wheezing Urologic: negative for hematuria Abdominal: negative for nausea, vomiting, diarrhea, bright red blood per rectum, melena, or hematemesis Neurologic: negative for visual changes, syncope, or dizziness All other systems reviewed and  are otherwise negative except as noted above.  Labs: No results found for this basename: CKTOTAL, CKMB, TROPONINI,  in the last 72 hours Lab Results  Component Value Date   WBC 13.4* 08/15/2012   HGB 11.6* 08/15/2012   HCT 36.4* 08/15/2012   MCV 85.8 08/15/2012   PLT 121* 08/15/2012    Recent Labs Lab 08/15/12 0955  NA 138  K 3.9  CL 103  CO2 25  BUN 19  CREATININE 1.49*  CALCIUM 8.4  PROT 6.8  BILITOT 0.8  ALKPHOS 45  ALT 15  AST 18  GLUCOSE 128*    Radiology/Studies:  Dg Chest 1 View  08/14/2012   *RADIOLOGY REPORT*  Clinical Data: Epigastric abdominal pain and nausea.  CHEST - 1 VIEW  Comparison: 05/18/2012  Findings: Stable appearance of cardiac pacemaker since previous study.  Shallow inspiration. The heart size and pulmonary vascularity are normal. The lungs appear clear and expanded without focal air space disease or consolidation. No blunting of the costophrenic angles.  No pneumothorax.  Mediastinal contours appear intact.  IMPRESSION: No evidence of active pulmonary disease.   Original Report Authenticated By: Burman Nieves, M.D.   US Abdomen Complete  08/14/2012   *RADIOLOGY REPORT*  Clinical Data:  Abdominal pain  COMPLETE ABDOMINAL ULTRASOUND  Comparison:  CT abdomen dated 02/11/2012  Findings:  Gallbladder:  No gallstones, gallbladder wall thickening, or pericholecystic fluid.   Negative sonographic Murphy's sign.  Common bile duct:  Measures 2 mm.  Liver:  Nodular hepatic contour with heterogeneous parenchymal echotexture, suggesting cirrhosis.  No focal lesion identified.  IVC:  Not visualized.  Pancreas:  Not visualized due to overlying bowel gas.  Spleen:  Measures 4 mm.  Right Kidney:  Measures 10.2 cm.  11 x 13 x 10 mm lateral interpolar cyst.  No hydronephrosis.  Left Kidney:  Measures 10.3 cm.  No mass or hydronephrosis.  Abdominal aorta:  Not visualized.  IMPRESSION: Possible cirrhosis.  13 mm right renal cyst.  Otherwise negative abdominal ultrasound.   Original Report Authenticated By: Charline Bills, M.D.   Ct Abdomen Pelvis W Contrast  08/15/2012   *RADIOLOGY REPORT*  Clinical Data: Mid abdominal pain after eating chicken. Constipation.  CT ABDOMEN AND PELVIS WITH CONTRAST  Technique:  Multidetector CT imaging of the abdomen and pelvis was performed following the standard protocol during bolus administration of intravenous contrast.  Contrast: OMNIPAQUE IOHEXOL 300 MG/ML  SOLN  Comparison: 02/11/2012  Findings: Mild dependent atelectasis in the lung bases.  The liver, spleen, gallbladder, adrenal glands, inferior vena cava, and retroperitoneal lymph nodes are unremarkable.  Calcification and thrombus in the abdominal aorta without aneurysm.  Small sub centimeter cysts in the kidneys appear stable.  No solid mass or hydronephrosis is identified.  Simple appearing cystic lesion in the body of the pancreas measuring 2 cm diameter.  This is stable in size and appearance on studies dating back to 04/25/2011.  6- month follow-up CT or MRI is recommended to confirm stability.  The gastric wall is not thickened.  Small bowel are not distended. Colon is mostly decompressed.  The right colon is fluid-filled and may demonstrate mild wall thickening although this could be due to incomplete distension.  There is no pericolonic infiltration.  Mild colitis is not excluded. No free  air or free fluid in the abdomen. Abdominal wall musculature appears intact.  Pelvis: Calcification in the prostate gland.  Bladder wall is not thickened.  No free or loculated pelvic fluid collections. Rectosigmoid colon is  decompressed without evidence of diverticulitis.  The appendix is fluid-filled and demonstrates borderline caliber at 8-11 mm diameter.  There is a small appendicolith at the base.  Minimal infiltration around the appendix.  Changes are nonspecific but could reflect early acute appendicitis.  No evidence of rupture or abscess.  No significant pelvic lymphadenopathy.  Normal alignment of the lumbar spine with mild degenerative changes.  IMPRESSION:  1.  Stable 2 cm diameter cyst in the body of the pancreas.  65-month follow-up is recommended. 2.  Mild thickening of the right colon wall versus under distension.  Mild colitis is not excluded. 3.  Mildly distended fluid filled appendix with minimal inflammatory stranding.  Changes are nonspecific but suggest early acute appendicitis.  Correlation with physical examination and laboratory values is recommended.   Original Report Authenticated By: Burman Nieves, M.D.    EKG, 08/14/2012: Atrial sensing with V pacing at 68 bpm  Physical Exam: Blood pressure 121/94, pulse 85, temperature 99.7 F (37.6 C), temperature source Oral, resp. rate 28, height 5\' 7"  (1.702 m), weight 197 lb 15.6 oz (89.8 kg), SpO2 96.00%. General: Well developed, well nourished, in no acute distress. Head: Normocephalic, atraumatic, sclera non-icteric, no xanthomas, nares are without discharge.  Neck: Negative for carotid bruits. JVD not elevated. Lungs: Clear bilaterally to auscultation without wheezes, rales, or rhonchi. Breathing is unlabored. Heart: RRR with S1 S2. No murmurs, rubs, or gallops appreciated. Abdomen: Soft, non-tender, non-distended with normoactive bowel sounds. No hepatomegaly. No rebound/guarding. No obvious abdominal masses. Msk:  Strength and  tone appear normal for age. Extremities: No clubbing or cyanosis. No edema.  Distal pedal pulses are 2+ and equal bilaterally. Neuro: Alert and oriented X 3. Moves all extremities spontaneously. Psych:  Responds to questions appropriately with a normal affect.     Assessment and Plan:   1 NICM  - EF 35-40%, echo, 02/2009  - NL cors; EF 10-15%, by cardiac catheterization, 2005  - s/p AICD 2 Abdominal pain  - possible early acute appendicitis  - deemed poor risk for surgery 3 chronic systolic heart failure, euvolemic  4 chronic Coumadin anticoagulation  - Question history of dysrhythmia 5 COPD/emphysema  - Oxygen dependent  PLAN:  Patient remains stable from a cardiac standpoint, with no current evidence of decompensated heart failure. His last echocardiogram was in 2011, showing improvement in LVF (35-40%), with grade 2 diastolic dysfunction. We will reassess LVF with a complete echocardiogram, in preparation for possible surgery. If so, patient is deemed to be at moderate risk from a cardiac standpoint, but is otherwise cleared to proceed for surgery, if necessary.  Signed, SERPE, EUGENE PA-C 08/15/2012, 11:51 AM   Attending note:  Patient seen and examined. Records reviewed and case was discussed with Robert Davies. Patient presents with abdominal pain - has been evaluated by Dr. Magnus Ivan, possibly early acute appendicitis being considered. He is on antibiotics, feels somewhat better today. States he has not had a bowel movement in 1 week.   From a cardiac perspective he has had no recent CHF exacerbations, no ICD shocks, reports compliance with his medications. He is on Coumadin per primary care provider - I am not certain of the specific indication based on available information, possibly atrial arrhythmia. Creatinine is 1.4, CXR clear, ECG shows ventricular paced rhythm with atrial sensing.  Would expect perioperative cardiac risk to be in the moderate range, formal pulmonary  assessment pending regarding reportedly severe COPD, so this risk could be higher overall. If he needs to  have surgery for an acute abdominal process, I would not say that his cardiac risk is too high to proceed.  Jonelle Sidle, M.D., F.A.C.C.

## 2012-08-15 NOTE — ED Notes (Signed)
Report given to the rn on 3300 

## 2012-08-15 NOTE — Progress Notes (Signed)
Assessment/Plan: Principal Problem:   Abdominal pain - he has less pain, but I am concerned about his silent abdomen. I will ask for cardiology and pulmonary consults per [facility] request. Not currently with any new shortness of breath, coughing, or chest pain. We will await further considerations by [facility].  Active Problems:   nonischemic cardiomyopathy    SYSTOLIC HEART FAILURE, CHRONIC   COPD   Subjective: Feels a little better. No flatus. No diarrhea. Mildly nauseated "but I can't vomit." Pain is less intense.   Objective:  Vital Signs: Filed Vitals:   08/15/12 0417 08/15/12 0530 08/15/12 0809 08/15/12 0935  BP:  133/61 121/94   Pulse:   85   Temp: 100.2 F (37.9 C) 98.5 F (36.9 C)    TempSrc:  Oral    Resp:  37 28   Height:  5\' 7"  (1.702 m)    Weight:  89.8 kg (197 lb 15.6 oz)    SpO2:  98% 96% 96%     EXAM: Distended but soft. No BS. Mild midepigastric and far RLQ tenderness.   No intake or output data in the 24 hours ending 08/15/12 1049  Lab Results:  Recent Labs  08/14/12 2035  NA 137  K 4.1  CL 101  CO2 26  GLUCOSE 189*  BUN 22  CREATININE 1.51*  CALCIUM 9.1    Recent Labs  08/14/12 2035  AST 21  ALT 17  ALKPHOS 46  BILITOT 0.5  PROT 7.9  ALBUMIN 4.3    Recent Labs  08/14/12 2035  LIPASE 36    Recent Labs  08/14/12 2035  WBC 14.1*  NEUTROABS 11.5*  HGB 13.1  HCT 38.4*  MCV 83.5  PLT 135*   No results found for this basename: CKTOTAL, CKMB, CKMBINDEX, TROPONINI,  in the last 72 hours No components found with this basename: POCBNP,  No results found for this basename: DDIMER,  in the last 72 hours No results found for this basename: HGBA1C,  in the last 72 hours No results found for this basename: CHOL, HDL, LDLCALC, TRIG, CHOLHDL, LDLDIRECT,  in the last 72 hours No results found for this basename: TSH, T4TOTAL, FREET3, T3FREE, THYROIDAB,  in the last 72 hours No results found for this basename: VITAMINB12, FOLATE, FERRITIN, TIBC,  IRON, RETICCTPCT,  in the last 72 hours  Studies/Results: Dg Chest 1 View  08/14/2012   *RADIOLOGY REPORT*  Clinical Data: Epigastric abdominal pain and nausea.  CHEST - 1 VIEW  Comparison: 05/18/2012  Findings: Stable appearance of cardiac pacemaker since previous study.  Shallow inspiration. The heart size and pulmonary vascularity are normal. The lungs appear clear and expanded without focal air space disease or consolidation. No blunting of the costophrenic angles.  No pneumothorax.  Mediastinal contours appear intact.  IMPRESSION: No evidence of active pulmonary disease.   Original Report Authenticated By: Burman Nieves, M.D.   US Abdomen Complete  08/14/2012   *RADIOLOGY REPORT*  Clinical Data:  Abdominal pain  COMPLETE ABDOMINAL ULTRASOUND  Comparison:  CT abdomen dated 02/11/2012  Findings:  Gallbladder:  No gallstones, gallbladder wall thickening, or pericholecystic fluid.  Negative sonographic Murphy's sign.  Common bile duct:  Measures 2 mm.  Liver:  Nodular hepatic contour with heterogeneous parenchymal echotexture, suggesting cirrhosis.  No focal lesion identified.  IVC:  Not visualized.  Pancreas:  Not visualized due to overlying bowel gas.  Spleen:  Measures 4 mm.  Right Kidney:  Measures 10.2 cm.  11 x 13 x 10 mm lateral  interpolar cyst.  No hydronephrosis.  Left Kidney:  Measures 10.3 cm.  No mass or hydronephrosis.  Abdominal aorta:  Not visualized.  IMPRESSION: Possible cirrhosis.  13 mm right renal cyst.  Otherwise negative abdominal ultrasound.   Original Report Authenticated By: Charline Bills, M.D.   Ct Abdomen Pelvis W Contrast  08/15/2012   *RADIOLOGY REPORT*  Clinical Data: Mid abdominal pain after eating chicken. Constipation.  CT ABDOMEN AND PELVIS WITH CONTRAST  Technique:  Multidetector CT imaging of the abdomen and pelvis was performed following the standard protocol during bolus administration of intravenous contrast.  Contrast: OMNIPAQUE IOHEXOL 300 MG/ML  SOLN   Comparison: 02/11/2012  Findings: Mild dependent atelectasis in the lung bases.  The liver, spleen, gallbladder, adrenal glands, inferior vena cava, and retroperitoneal lymph nodes are unremarkable.  Calcification and thrombus in the abdominal aorta without aneurysm.  Small sub centimeter cysts in the kidneys appear stable.  No solid mass or hydronephrosis is identified.  Simple appearing cystic lesion in the body of the pancreas measuring 2 cm diameter.  This is stable in size and appearance on studies dating back to 04/25/2011.  6- month follow-up CT or MRI is recommended to confirm stability.  The gastric wall is not thickened.  Small bowel are not distended. Colon is mostly decompressed.  The right colon is fluid-filled and may demonstrate mild wall thickening although this could be due to incomplete distension.  There is no pericolonic infiltration.  Mild colitis is not excluded. No free air or free fluid in the abdomen. Abdominal wall musculature appears intact.  Pelvis: Calcification in the prostate gland.  Bladder wall is not thickened.  No free or loculated pelvic fluid collections. Rectosigmoid colon is decompressed without evidence of diverticulitis.  The appendix is fluid-filled and demonstrates borderline caliber at 8-11 mm diameter.  There is a small appendicolith at the base.  Minimal infiltration around the appendix.  Changes are nonspecific but could reflect early acute appendicitis.  No evidence of rupture or abscess.  No significant pelvic lymphadenopathy.  Normal alignment of the lumbar spine with mild degenerative changes.  IMPRESSION:  1.  Stable 2 cm diameter cyst in the body of the pancreas.  68-month follow-up is recommended. 2.  Mild thickening of the right colon wall versus under distension.  Mild colitis is not excluded. 3.  Mildly distended fluid filled appendix with minimal inflammatory stranding.  Changes are nonspecific but suggest early acute appendicitis.  Correlation with physical  examination and laboratory values is recommended.   Original Report Authenticated By: Burman Nieves, M.D.   Medications: Medications administered in the last 24 hours reviewed.  Current Medication List reviewed.    LOS: 1 day   [facility] @ [facility] [phone]) 08/15/2012, 10:49 AM

## 2012-08-15 NOTE — ED Provider Notes (Addendum)
Care assumed from Dr Oletta Lamas.  Pt with epigastric pain today after eating chicken meal.  U/s without cholelithiasis/cystitis, labs with leukocytosis.  Pt to receive pain/nausea medications, awaiting CT abd/pelvis.  Olivia Mackie, MD 08/15/12 0042  3:01 AM Results for orders placed during the hospital encounter of 08/14/12  CBC WITH DIFFERENTIAL      Result Value Range   WBC 14.1 (*) 4.0 - 10.5 K/uL   RBC 4.60  4.22 - 5.81 MIL/uL   Hemoglobin 13.1  13.0 - 17.0 g/dL   HCT 95.6 (*) 21.3 - 08.6 %   MCV 83.5  78.0 - 100.0 fL   MCH 28.5  26.0 - 34.0 pg   MCHC 34.1  30.0 - 36.0 g/dL   RDW 57.8 (*) 46.9 - 62.9 %   Platelets 135 (*) 150 - 400 K/uL   Neutrophils Relative % 82 (*) 43 - 77 %   Neutro Abs 11.5 (*) 1.7 - 7.7 K/uL   Lymphocytes Relative 11 (*) 12 - 46 %   Lymphs Abs 1.6  0.7 - 4.0 K/uL   Monocytes Relative 6  3 - 12 %   Monocytes Absolute 0.9  0.1 - 1.0 K/uL   Eosinophils Relative 1  0 - 5 %   Eosinophils Absolute 0.2  0.0 - 0.7 K/uL   Basophils Relative 0  0 - 1 %   Basophils Absolute 0.0  0.0 - 0.1 K/uL  COMPREHENSIVE METABOLIC PANEL      Result Value Range   Sodium 137  135 - 145 mEq/L   Potassium 4.1  3.5 - 5.1 mEq/L   Chloride 101  96 - 112 mEq/L   CO2 26  19 - 32 mEq/L   Glucose, Bld 189 (*) 70 - 99 mg/dL   BUN 22  6 - 23 mg/dL   Creatinine, Ser 5.28 (*) 0.50 - 1.35 mg/dL   Calcium 9.1  8.4 - 41.3 mg/dL   Total Protein 7.9  6.0 - 8.3 g/dL   Albumin 4.3  3.5 - 5.2 g/dL   AST 21  0 - 37 U/L   ALT 17  0 - 53 U/L   Alkaline Phosphatase 46  39 - 117 U/L   Total Bilirubin 0.5  0.3 - 1.2 mg/dL   GFR calc non Af Amer 42 (*) >90 mL/min   GFR calc Af Amer 49 (*) >90 mL/min  LIPASE, BLOOD      Result Value Range   Lipase 36  11 - 59 U/L  URINALYSIS, ROUTINE W REFLEX MICROSCOPIC      Result Value Range   Color, Urine YELLOW  YELLOW   APPearance CLEAR  CLEAR   Specific Gravity, Urine 1.016  1.005 - 1.030   pH 5.0  5.0 - 8.0   Glucose, UA NEGATIVE  NEGATIVE mg/dL   Hgb  urine dipstick NEGATIVE  NEGATIVE   Bilirubin Urine NEGATIVE  NEGATIVE   Ketones, ur NEGATIVE  NEGATIVE mg/dL   Protein, ur NEGATIVE  NEGATIVE mg/dL   Urobilinogen, UA 0.2  0.0 - 1.0 mg/dL   Nitrite NEGATIVE  NEGATIVE   Leukocytes, UA NEGATIVE  NEGATIVE  PROTIME-INR      Result Value Range   Prothrombin Time 19.9 (*) 11.6 - 15.2 seconds   INR 1.75 (*) 0.00 - 1.49  GLUCOSE, CAPILLARY      Result Value Range   Glucose-Capillary 173 (*) 70 - 99 mg/dL  POCT I-STAT TROPONIN I      Result Value Range  Troponin i, poc 0.00  0.00 - 0.08 ng/mL   Comment 3            Dg Chest 1 View  08/14/2012   *RADIOLOGY REPORT*  Clinical Data: Epigastric abdominal pain and nausea.  CHEST - 1 VIEW  Comparison: 05/18/2012  Findings: Stable appearance of cardiac pacemaker since previous study.  Shallow inspiration. The heart size and pulmonary vascularity are normal. The lungs appear clear and expanded without focal air space disease or consolidation. No blunting of the costophrenic angles.  No pneumothorax.  Mediastinal contours appear intact.  IMPRESSION: No evidence of active pulmonary disease.   Original Report Authenticated By: Burman Nieves, M.D.   US Abdomen Complete  08/14/2012   *RADIOLOGY REPORT*  Clinical Data:  Abdominal pain  COMPLETE ABDOMINAL ULTRASOUND  Comparison:  CT abdomen dated 02/11/2012  Findings:  Gallbladder:  No gallstones, gallbladder wall thickening, or pericholecystic fluid.  Negative sonographic Murphy's sign.  Common bile duct:  Measures 2 mm.  Liver:  Nodular hepatic contour with heterogeneous parenchymal echotexture, suggesting cirrhosis.  No focal lesion identified.  IVC:  Not visualized.  Pancreas:  Not visualized due to overlying bowel gas.  Spleen:  Measures 4 mm.  Right Kidney:  Measures 10.2 cm.  11 x 13 x 10 mm lateral interpolar cyst.  No hydronephrosis.  Left Kidney:  Measures 10.3 cm.  No mass or hydronephrosis.  Abdominal aorta:  Not visualized.  IMPRESSION: Possible  cirrhosis.  13 mm right renal cyst.  Otherwise negative abdominal ultrasound.   Original Report Authenticated By: Charline Bills, M.D.   Ct Abdomen Pelvis W Contrast  08/15/2012   *RADIOLOGY REPORT*  Clinical Data: Mid abdominal pain after eating chicken. Constipation.  CT ABDOMEN AND PELVIS WITH CONTRAST  Technique:  Multidetector CT imaging of the abdomen and pelvis was performed following the standard protocol during bolus administration of intravenous contrast.  Contrast: OMNIPAQUE IOHEXOL 300 MG/ML  SOLN  Comparison: 02/11/2012  Findings: Mild dependent atelectasis in the lung bases.  The liver, spleen, gallbladder, adrenal glands, inferior vena cava, and retroperitoneal lymph nodes are unremarkable.  Calcification and thrombus in the abdominal aorta without aneurysm.  Small sub centimeter cysts in the kidneys appear stable.  No solid mass or hydronephrosis is identified.  Simple appearing cystic lesion in the body of the pancreas measuring 2 cm diameter.  This is stable in size and appearance on studies dating back to 04/25/2011.  6- month follow-up CT or MRI is recommended to confirm stability.  The gastric wall is not thickened.  Small bowel are not distended. Colon is mostly decompressed.  The right colon is fluid-filled and may demonstrate mild wall thickening although this could be due to incomplete distension.  There is no pericolonic infiltration.  Mild colitis is not excluded. No free air or free fluid in the abdomen. Abdominal wall musculature appears intact.  Pelvis: Calcification in the prostate gland.  Bladder wall is not thickened.  No free or loculated pelvic fluid collections. Rectosigmoid colon is decompressed without evidence of diverticulitis.  The appendix is fluid-filled and demonstrates borderline caliber at 8-11 mm diameter.  There is a small appendicolith at the base.  Minimal infiltration around the appendix.  Changes are nonspecific but could reflect early acute  appendicitis.  No evidence of rupture or abscess.  No significant pelvic lymphadenopathy.  Normal alignment of the lumbar spine with mild degenerative changes.  IMPRESSION:  1.  Stable 2 cm diameter cyst in the body of the pancreas.  67-month follow-up is recommended. 2.  Mild thickening of the right colon wall versus under distension.  Mild colitis is not excluded. 3.  Mildly distended fluid filled appendix with minimal inflammatory stranding.  Changes are nonspecific but suggest early acute appendicitis.  Correlation with physical examination and laboratory values is recommended.   Original Report Authenticated By: Burman Nieves, M.D.    Possible appendicitis.  Pt re-examined, reports pain mainly in upper abdomen, but has significant pain with palpation of RLQ.  Will d/w surgery.     Olivia Mackie, MD 08/15/12 0302  3:39 AM Surgery, Dr Magnus Ivan has seen. Given multiple co morbidities, he requests medicine admission for risk stratification for potential surgery.  He will initiate abx therapy  Olivia Mackie, MD 08/15/12 [phone]

## 2012-08-15 NOTE — Progress Notes (Signed)
The patient interviewed and examined. I've reviewed his lab work, CT scans, and presentation. I agree with the assessment and treatment plan outlined by Earney Hamburg, PA.  The etiology of his abdominal pain is unclear. Appendicitis seems unlikely. He is more tender in the epigastrium. No clinical or radiographic evidence for pancreatitis.  I advise continued medical management.Marland KitchenMarland KitchenBowel rest, hydration, antibiotics, Prtoon pump inhibitors, analgesics.  There is no indication for surgical intervention at this point in time. We'll continue to follow, in case he deteriorates.  Angelia Mould. Derrell Lolling, M.D., [facility] General and Minimally invasive Surgery Breast and Colorectal Surgery Office:   [phone] Pager:   [phone]

## 2012-08-15 NOTE — Progress Notes (Signed)
Patient ID: Robert Davies, male   DOB: [birth year], 79 y.o.   MRN: [id number]    LOS: 1 day   Subjective: Patient feels a little better than yesterday but still having abdominal pain in the same places; mostly in the epigastrium and some in the RLQ. Denies N/V.   Objective: Vital signs in last 24 hours: Temp:  [97.9 F (36.6 C)-100.2 F (37.9 C)] 97.9 F (36.6 C) (07/27 1230) Pulse Rate:  [58-85] 85 (07/27 0809) Resp:  [14-37] 28 (07/27 0809) BP: (112-154)/(61-94) 121/94 mmHg (07/27 0809) SpO2:  [95 %-100 %] 96 % (07/27 0935) Weight:  [197 lb 15.6 oz (89.8 kg)] 197 lb 15.6 oz (89.8 kg) (07/27 0530)    Laboratory  CBC  Recent Labs  08/14/12 2035 08/15/12 0955  WBC 14.1* 13.4*  HGB 13.1 11.6*  HCT 38.4* 36.4*  PLT 135* 121*   BMET  Recent Labs  08/14/12 2035 08/15/12 0955  NA 137 138  K 4.1 3.9  CL 101 103  CO2 26 25  GLUCOSE 189* 128*  BUN 22 19  CREATININE 1.51* 1.49*  CALCIUM 9.1 8.4   Lab Results  Component Value Date   INR 1.79* 08/15/2012   INR 1.75* 08/14/2012   INR 2.16* 05/18/2012    Physical Exam General appearance: alert and no distress Resp: clear to auscultation bilaterally Cardio: regular rate and rhythm GI: Soft, diminished BS, moderate TTP epigastrium, mild TTP RLQ, +rebound to epigastrium   Assessment/Plan: Abdominal pain --  Noted cards assessment of moderate to severe surgical risk. Given improvement in VS, WBC, and subjective pain and no increase in his RLQ pain as well as continued elevation of his INR I think it is reasonable to continue supportive care and observation. DTR notes similar e/o abd pain that resolved with treatment of constipation in the past. Stool burden certainly not excessive on CT but will treat aggressively.   F/u CBC, INR in am. Will allow clears if it does not exacerbate his pain.    Freeman Caldron, PA-C Pager: [phone] 08/15/2012

## 2012-08-15 NOTE — Progress Notes (Signed)
ANTIBIOTIC CONSULT NOTE - INITIAL  Pharmacy Consult for Zosyn Indication:  Appendicitis  Allergies  Allergen Reactions  . Adhesive (Tape)     Break out   Patient Measurements: Weight:  84.9KG (2013) Height:  170cm  Vital Signs: Temp: 100.2 F (37.9 C) (07/27 0417) Temp src: Oral (07/27 0250) BP: 126/82 mmHg (07/27 0414) Pulse Rate: 74 (07/27 0414)  Labs:  Recent Labs  08/14/12 2035  WBC 14.1*  HGB 13.1  PLT 135*  CREATININE 1.51*   Medical History: Past Medical History  Diagnosis Date  . Emphysema     Oxygen-dependent  . Chronic systolic heart failure   . Implantable cardiac defibrillator infection     Drainage identified July 2012  . ICD (implantable cardiac defibrillator) in place     CRT St Jude; remote - yes  . Nonischemic cardiomyopathy     a. Catheterization 2005 no obstructive coronary disease;  b. 02/2009 Echo: EF 35-40%, Gr 2 DD, mild lvh.  Marland Kitchen HLD (hyperlipidemia)   . Dysrhythmia   . Shortness of breath 11/27/10     "@ rest; sitting up; exerction"  . Hypertension   . Myocardial infarction 1990's  . Peptic ulcer     "bleeding ulcers"  . GI bleeding   . COPD (chronic obstructive pulmonary disease)     a. On 3lpm of O2  . Hyperlipidemia   . BPH (benign prostatic hypertrophy)   . DM (diabetes mellitus)   . Coronary artery disease    Medications:  Anti-infectives   Start     Dose/Rate Route Frequency Ordered Stop   08/15/12 0337  piperacillin-tazobactam (ZOSYN) IVPB 3.375 g     3.375 g 12.5 mL/hr over 240 Minutes Intravenous 3 times per day 08/15/12 7829       Assessment: 79yo male admitted with c/o abdominal pain.  A CT of the abdomen was done and is concerning for possible appendicitis and/or GI ischemic process.  We have been asked to assist with his empiric antibiotic coverage while his work-up is ongoing.  His creatinine is 1.5 with an estimated clearance ~ 40 ml/min.  He has some leukocytosis with WBC of 14.1K.  Vitals are stable and no noted  temperature presently.  Goal of Therapy:  Appropriate dosing for renal fxn.  Plan:  1.  Continue IV Zosyn 3.375 gm IV every 8 hours 2.  Monitor renal function and adjust as needed 3.  F/U culture data   Nadara Mustard, PharmD., MS Clinical Pharmacist Pager:  [phone] Thank you for allowing pharmacy to be part of this patients care team. 08/15/2012,5:39 AM

## 2012-08-15 NOTE — Consult Note (Signed)
PULMONARY  / CRITICAL CARE MEDICINE  Name: Robert Davies MRN: [id number] DOB: 1930-04-28    ADMISSION DATE:  08/14/2012 CONSULTATION DATE:  08/15/12  REFERRING MD :  Earl Gala PRIMARY SERVICE: [name]  CHIEF COMPLAINT:  Abdominal pain  BRIEF PATIENT DESCRIPTION: 70 yoM former 2PPD smoker with COPD, CAD, home O2 3-4 L, home neb, DOE around house on O2, no recent exacerbation. Presented for abdominal pain. Pulmonary is asked for pre-op clearance anticipating he might need surgery.  SIGNIFICANT EVENTS / STUDIES:    LINES / TUBES:   CULTURES:   ANTIBIOTICS: Pip-tazo 7/27>>  HISTORY OF PRESENT ILLNESS:  Charted HPI reviewed: 79 y.o. male history of cardiomyopathy status post AICD placement, COPD and home oxygen, diabetes mellitus, hyperlipidemia, hypertension and on Coumadin probably for A. fib presents with complaints of abdominal pain. Patient started developed epigastric pain after dinner. CT abdomen and pelvis shows features concerning for early appendicitis and colitis.   2PPD smoker finally quit summer, 2013. Known COPD but denies hx asthma or pneumonia. Former [employer] with no family hx of lung disease. Thinks he has had pneumonia vax. Continuous home O2 3L/ 4L pulsed for exertion/ Advanced. DOE at baseline around house or with stairs.Occasional cough, sometimes productive white. Denies blood, chest pain, purulent. Feet only swell with gout. Little day to day change and he has felt stable recently. Uses Advair 250, neb albuterol and neb budesonide twice daily.  No PFT available. Cardiac hx including CAD/MI/AFib/pacer/ warfarin as charted. Mild obstructive sleep apnea/ 9/ hr on NPSG 2003.  Note glaucoma on timolol.  PAST MEDICAL HISTORY :  Past Medical History  Diagnosis Date  . Emphysema     Oxygen-dependent  . Chronic systolic heart failure   . Implantable cardiac defibrillator infection     July 2012  . ICD (implantable cardiac defibrillator) in place     CRT [facility]; remote - yes  . Nonischemic cardiomyopathy     a. Catheterization 2005 no obstructive coronary disease;  b. 02/2009 Echo: EF 35-40%, Gr 2 DD, mild lvh.  Marland Kitchen HLD (hyperlipidemia)   . Essential hypertension, benign   . Myocardial infarction 1990's  . Peptic ulcer   . GI bleeding   . BPH (benign prostatic hypertrophy)   . DM (diabetes mellitus)   . Coronary atherosclerosis of native coronary artery     Nonobstructive   Past Surgical History  Procedure Laterality Date  . Adenoidectomy    . Doppler echocardiography  2005, 2011  . Cardiac defibrillator placement  11/27/10    "this is my 3rd defibrillator"  . Cervical disc surgery  1990's    "slipped"  . Laceration repair  ~ 1939    right leg  . Tonsillectomy  ~ 1940  . Insert / replace / remove pacemaker  11/27/10    "this is my third one"  . Eye surgery    . Cataract extraction      "left eye"   Prior to Admission medications   Medication Sig Start Date End Date Taking? Authorizing Provider  albuterol (ACCUNEB) 1.25 MG/3ML nebulizer solution Take 1 ampule by nebulization 2 (two) times daily.    Yes Historical Provider, MD  bisacodyl (BISACODYL) 5 MG EC tablet Take 10 mg by mouth at bedtime.   Yes Historical Provider, MD  colchicine 0.6 MG tablet Take 0.6 mg by mouth daily.   Yes Historical Provider, MD  Dorzolamide HCl-Timolol Mal (COSOPT OP) Apply 1 drop to eye 2 (two) times daily.  Yes Historical Provider, MD  enalapril (VASOTEC) 10 MG tablet Take 1 tablet (10 mg total) by mouth daily. 12/10/11  Yes Marinus Maw, MD  famotidine (PEPCID) 20 MG tablet Take 20 mg by mouth 2 (two) times daily.    Yes Historical Provider, MD  fluticasone (FLONASE) 50 MCG/ACT nasal spray Place 2 sprays into the nose daily.   Yes Historical Provider, MD  Fluticasone-Salmeterol (ADVAIR DISKUS) 250-50 MCG/DOSE AEPB Inhale 1 puff into the lungs 2 (two) times daily.     Yes Historical Provider, MD  furosemide (LASIX) 40 MG tablet Take 40 mg by mouth  daily.   Yes Historical Provider, MD  glimepiride (AMARYL) 2 MG tablet Take 2 mg by mouth 2 (two) times daily.     Yes Historical Provider, MD  metoprolol (TOPROL-XL) 50 MG 24 hr tablet Take 50 mg by mouth daily.     Yes Historical Provider, MD  pantoprazole (PROTONIX) 40 MG tablet Take 40 mg by mouth daily.   Yes Historical Provider, MD  polyethylene glycol (MIRALAX / GLYCOLAX) packet Take 17 g by mouth daily as needed (for constipation).   Yes Historical Provider, MD  potassium chloride (KLOR-CON) 10 MEQ CR tablet Take 10 mEq by mouth 2 (two) times daily.     Yes Historical Provider, MD  simvastatin (ZOCOR) 20 MG tablet Take 20 mg by mouth at bedtime.     Yes Historical Provider, MD  sitaGLIPtin (JANUVIA) 50 MG tablet Take 50 mg by mouth daily.   Yes Historical Provider, MD  Tamsulosin HCl (FLOMAX) 0.4 MG CAPS Take 0.4 mg by mouth daily.     Yes Historical Provider, MD  tiotropium (SPIRIVA) 18 MCG inhalation capsule Place 18 mcg into inhaler and inhale daily.   Yes Historical Provider, MD  varenicline (CHANTIX) 0.5 MG tablet Take 0.5 mg by mouth 2 (two) times daily.   Yes Historical Provider, MD  warfarin (COUMADIN) 5 MG tablet Take 5 mg by mouth daily.    Yes Historical Provider, MD   Allergies  Allergen Reactions  . Adhesive (Tape)     Break out    FAMILY HISTORY:  Family History  Problem Relation Age of Onset  . Cancer Father   . Cancer Mother     Throat  . Lung cancer Daughter    SOCIAL HISTORY:  reports that he quit smoking about 13 months ago. His smoking use included Cigarettes. He smoked 2.00 packs per day. He has never used smokeless tobacco. He reports that he does not drink alcohol or use illicit drugs.  REVIEW OF SYSTEMS:   Constitutional: Negative for fever, chills, weight loss, malaise/fatigue and diaphoresis.  HENT: Negative for hearing loss, ear pain, nosebleeds, congestion, sore throat, neck pain, tinnitus and ear discharge.   Eyes: Negative for blurred vision,  double vision, photophobia, pain, discharge and redness.  Respiratory: No-cough, hemoptysis, sputum production,+ shortness of breath, No-wheezing and stridor.   Cardiovascular: Negative for chest pain, palpitations, orthopnea, claudication, leg swelling and PND.  Gastrointestinal: Negative for heartburn, nausea, vomiting, +abdominal pain, diarrhea, constipation, blood in stool and melena.  Genitourinary: Negative for dysuria, urgency, frequency, hematuria and flank pain.  Musculoskeletal: Negative for myalgias, back pain, joint pain and falls.  Skin: Negative for itching and rash.  Neurological: Negative for dizziness, tingling, tremors, sensory change, speech change, focal weakness, seizures, loss of consciousness, weakness and headaches.  Endo/Heme/Allergies: Negative for environmental allergies and polydipsia. Does not bruise/bleed easily.  SUBJECTIVE: Abdomen much more comfortable. Denies dyspnea on O2 at  rest now.  VITAL SIGNS: Temp:  [97.9 F (36.6 C)-100.2 F (37.9 C)] 97.9 F (36.6 C) (07/27 1230) Pulse Rate:  [58-85] 85 (07/27 0809) Resp:  [14-37] 28 (07/27 0809) BP: (112-154)/(61-94) 121/94 mmHg (07/27 0809) SpO2:  [95 %-100 %] 96 % (07/27 0935) Weight:  [89.8 kg (197 lb 15.6 oz)] 89.8 kg (197 lb 15.6 oz) (07/27 0530)  PHYSICAL EXAMINATION: General:  Pleasant calm cooperative man lying in bed/ Neuro:  Alert and oriented HEENT:  Edentulous, PERRLA Neck:  No JVD or stridor Cardiovascular:  Paced regular, no murmur Lungs:  Distant, unlabored on nasal prong O2. Few crackles R lateral base.No cough or wheeze Abdomen:  Protuberant, quiet Musculoskeletal:  Normal musculature Skin:  No evident rash   Recent Labs Lab 08/14/12 2035 08/15/12 0955  NA 137 138  K 4.1 3.9  CL 101 103  CO2 26 25  BUN 22 19  CREATININE 1.51* 1.49*  GLUCOSE 189* 128*    Recent Labs Lab 08/14/12 2035 08/15/12 0955  HGB 13.1 11.6*  HCT 38.4* 36.4*  WBC 14.1* 13.4*  PLT 135* 121*   Dg  Chest 1 View  08/14/2012   *RADIOLOGY REPORT*  Clinical Data: Epigastric abdominal pain and nausea.  CHEST - 1 VIEW  Comparison: 05/18/2012  Findings: Stable appearance of cardiac pacemaker since previous study.  Shallow inspiration. The heart size and pulmonary vascularity are normal. The lungs appear clear and expanded without focal air space disease or consolidation. No blunting of the costophrenic angles.  No pneumothorax.  Mediastinal contours appear intact.  IMPRESSION: No evidence of active pulmonary disease.   Original Report Authenticated By: Burman Nieves, M.D.   US Abdomen Complete  08/14/2012   *RADIOLOGY REPORT*  Clinical Data:  Abdominal pain  COMPLETE ABDOMINAL ULTRASOUND  Comparison:  CT abdomen dated 02/11/2012  Findings:  Gallbladder:  No gallstones, gallbladder wall thickening, or pericholecystic fluid.  Negative sonographic Murphy's sign.  Common bile duct:  Measures 2 mm.  Liver:  Nodular hepatic contour with heterogeneous parenchymal echotexture, suggesting cirrhosis.  No focal lesion identified.  IVC:  Not visualized.  Pancreas:  Not visualized due to overlying bowel gas.  Spleen:  Measures 4 mm.  Right Kidney:  Measures 10.2 cm.  11 x 13 x 10 mm lateral interpolar cyst.  No hydronephrosis.  Left Kidney:  Measures 10.3 cm.  No mass or hydronephrosis.  Abdominal aorta:  Not visualized.  IMPRESSION: Possible cirrhosis.  13 mm right renal cyst.  Otherwise negative abdominal ultrasound.   Original Report Authenticated By: Charline Bills, M.D.   Ct Abdomen Pelvis W Contrast  08/15/2012   *RADIOLOGY REPORT*  Clinical Data: Mid abdominal pain after eating chicken. Constipation.  CT ABDOMEN AND PELVIS WITH CONTRAST  Technique:  Multidetector CT imaging of the abdomen and pelvis was performed following the standard protocol during bolus administration of intravenous contrast.  Contrast: OMNIPAQUE IOHEXOL 300 MG/ML  SOLN  Comparison: 02/11/2012  Findings: Mild dependent atelectasis in  the lung bases.  The liver, spleen, gallbladder, adrenal glands, inferior vena cava, and retroperitoneal lymph nodes are unremarkable.  Calcification and thrombus in the abdominal aorta without aneurysm.  Small sub centimeter cysts in the kidneys appear stable.  No solid mass or hydronephrosis is identified.  Simple appearing cystic lesion in the body of the pancreas measuring 2 cm diameter.  This is stable in size and appearance on studies dating back to 04/25/2011.  6- month follow-up CT or MRI is recommended to confirm stability.  The gastric  wall is not thickened.  Small bowel are not distended. Colon is mostly decompressed.  The right colon is fluid-filled and may demonstrate mild wall thickening although this could be due to incomplete distension.  There is no pericolonic infiltration.  Mild colitis is not excluded. No free air or free fluid in the abdomen. Abdominal wall musculature appears intact.  Pelvis: Calcification in the prostate gland.  Bladder wall is not thickened.  No free or loculated pelvic fluid collections. Rectosigmoid colon is decompressed without evidence of diverticulitis.  The appendix is fluid-filled and demonstrates borderline caliber at 8-11 mm diameter.  There is a small appendicolith at the base.  Minimal infiltration around the appendix.  Changes are nonspecific but could reflect early acute appendicitis.  No evidence of rupture or abscess.  No significant pelvic lymphadenopathy.  Normal alignment of the lumbar spine with mild degenerative changes.  IMPRESSION:  1.  Stable 2 cm diameter cyst in the body of the pancreas.  56-month follow-up is recommended. 2.  Mild thickening of the right colon wall versus under distension.  Mild colitis is not excluded. 3.  Mildly distended fluid filled appendix with minimal inflammatory stranding.  Changes are nonspecific but suggest early acute appendicitis.  Correlation with physical examination and laboratory values is recommended.   Original  Report Authenticated By: Burman Nieves, M.D.   I reviewed CXR and medications  ASSESSMENT / PLAN: COPD with emphysema- Currently at baseline, with no acute process. He is as good as he can get for necessary GOT/ surgery, but will be at higher than average risk for cardiopulmonary complications. I don't have PFT to judge how much of his limitation is actually pulmonary.  Chronic hypoxic respiratory failure- O2 dependent. Significant limitation to exertion due to COPD and cardiomyopathy. Note that only budesonide has been carried over from his home nebulizer meds.   RECOMMEND- Consider adding back albuterol neb solution as PRN.  Please call us if needed further during this hospitalization.  Pulmonary and Critical Care Medicine [facility] Pager: [phone]  08/15/2012, 3:15 PM

## 2012-08-15 NOTE — ED Notes (Signed)
Patient transported to CT 

## 2012-08-15 NOTE — Consult Note (Signed)
Reason for Consult:abdominal pain, possible appendicitis Referring Physician: Dr. Cleotis Lema is an 79 y.o. male.  HPI: This is an 79 year old gentleman who presented last evening to the emergency department. Earlier in the day he had the sudden onset of epigastric abdominal pain while eating. He had no nausea or vomiting although he did try to induce vomiting. Up until now, his bowel movements have been normal. He reports that his pain remained in the epigastrium although he does have some tenderness diffusely and in the right lower quadrant. He has multiple severe chronic medical conditions and is on 3 L of oxygen at home. He has no similar history of abdominal pain. The pain is described as sharp and constant.  Past Medical History  Diagnosis Date  . Emphysema     Oxygen-dependent  . Chronic systolic heart failure   . Implantable cardiac defibrillator infection     Drainage identified July 2012  . ICD (implantable cardiac defibrillator) in place     CRT St Jude; remote - yes  . Nonischemic cardiomyopathy     a. Catheterization 2005 no obstructive coronary disease;  b. 02/2009 Echo: EF 35-40%, Gr 2 DD, mild lvh.  Marland Kitchen HLD (hyperlipidemia)   . Dysrhythmia   . Shortness of breath 11/27/10     "@ rest; sitting up; exerction"  . Hypertension   . Myocardial infarction 1990's  . Peptic ulcer     "bleeding ulcers"  . GI bleeding   . COPD (chronic obstructive pulmonary disease)     a. On 3lpm of O2  . Hyperlipidemia   . BPH (benign prostatic hypertrophy)   . DM (diabetes mellitus)   . Coronary artery disease     Past Surgical History  Procedure Laterality Date  . Adenoidectomy    . Doppler echocardiography  2005, 2011  . Cardiac defibrillator placement  11/27/10    "this is my 3rd defibrillator"  . Cervical disc surgery  1990's    "slipped"  . Laceration repair  ~ 1939    right leg  . Tonsillectomy  ~ 1940  . Insert / replace / remove pacemaker  11/27/10    "this is my  third one"  . Eye surgery    . Cataract extraction      "left eye"  . Cardiac catheterization  12/01/2003    Family History  Problem Relation Age of Onset  . Cancer Father   . Cancer Mother     throat  . Lung cancer Daughter     Social History:  reports that he quit smoking about 13 months ago. His smoking use included Cigarettes. He smoked 2.00 packs per day. He has never used smokeless tobacco. He reports that he does not drink alcohol or use illicit drugs.  Allergies:  Allergies  Allergen Reactions  . Adhesive (Tape)     Break out    Medications: I have reviewed the patient's current medications.  Results for orders placed during the hospital encounter of 08/14/12 (from the past 48 hour(s))  CBC WITH DIFFERENTIAL     Status: Abnormal   Collection Time    08/14/12  8:35 PM      Result Value Range   WBC 14.1 (*) 4.0 - 10.5 K/uL   RBC 4.60  4.22 - 5.81 MIL/uL   Hemoglobin 13.1  13.0 - 17.0 g/dL   HCT 14.7 (*) 82.9 - 56.2 %   MCV 83.5  78.0 - 100.0 fL   MCH 28.5  26.0 -  34.0 pg   MCHC 34.1  30.0 - 36.0 g/dL   RDW 16.1 (*) 09.6 - 04.5 %   Platelets 135 (*) 150 - 400 K/uL   Neutrophils Relative % 82 (*) 43 - 77 %   Neutro Abs 11.5 (*) 1.7 - 7.7 K/uL   Lymphocytes Relative 11 (*) 12 - 46 %   Lymphs Abs 1.6  0.7 - 4.0 K/uL   Monocytes Relative 6  3 - 12 %   Monocytes Absolute 0.9  0.1 - 1.0 K/uL   Eosinophils Relative 1  0 - 5 %   Eosinophils Absolute 0.2  0.0 - 0.7 K/uL   Basophils Relative 0  0 - 1 %   Basophils Absolute 0.0  0.0 - 0.1 K/uL  COMPREHENSIVE METABOLIC PANEL     Status: Abnormal   Collection Time    08/14/12  8:35 PM      Result Value Range   Sodium 137  135 - 145 mEq/L   Potassium 4.1  3.5 - 5.1 mEq/L   Chloride 101  96 - 112 mEq/L   CO2 26  19 - 32 mEq/L   Glucose, Bld 189 (*) 70 - 99 mg/dL   BUN 22  6 - 23 mg/dL   Creatinine, Ser 4.09 (*) 0.50 - 1.35 mg/dL   Calcium 9.1  8.4 - 81.1 mg/dL   Total Protein 7.9  6.0 - 8.3 g/dL   Albumin 4.3  3.5  - 5.2 g/dL   AST 21  0 - 37 U/L   ALT 17  0 - 53 U/L   Alkaline Phosphatase 46  39 - 117 U/L   Total Bilirubin 0.5  0.3 - 1.2 mg/dL   GFR calc non Af Amer 42 (*) >90 mL/min   GFR calc Af Amer 49 (*) >90 mL/min   Comment:            The eGFR has been calculated     using the CKD EPI equation.     This calculation has not been     validated in all clinical     situations.     eGFR's persistently     <90 mL/min signify     possible Chronic Kidney Disease.  LIPASE, BLOOD     Status: None   Collection Time    08/14/12  8:35 PM      Result Value Range   Lipase 36  11 - 59 U/L  PROTIME-INR     Status: Abnormal   Collection Time    08/14/12  8:35 PM      Result Value Range   Prothrombin Time 19.9 (*) 11.6 - 15.2 seconds   INR 1.75 (*) 0.00 - 1.49  POCT I-STAT TROPONIN I     Status: None   Collection Time    08/14/12  8:43 PM      Result Value Range   Troponin i, poc 0.00  0.00 - 0.08 ng/mL   Comment 3            Comment: Due to the release kinetics of cTnI,     a negative result within the first hours     of the onset of symptoms does not rule out     myocardial infarction with certainty.     If myocardial infarction is still suspected,     repeat the test at appropriate intervals.  GLUCOSE, CAPILLARY     Status: Abnormal   Collection Time    08/14/12 10:42 PM  Result Value Range   Glucose-Capillary 173 (*) 70 - 99 mg/dL  URINALYSIS, ROUTINE W REFLEX MICROSCOPIC     Status: None   Collection Time    08/15/12 12:21 AM      Result Value Range   Color, Urine YELLOW  YELLOW   APPearance CLEAR  CLEAR   Specific Gravity, Urine 1.016  1.005 - 1.030   pH 5.0  5.0 - 8.0   Glucose, UA NEGATIVE  NEGATIVE mg/dL   Hgb urine dipstick NEGATIVE  NEGATIVE   Bilirubin Urine NEGATIVE  NEGATIVE   Ketones, ur NEGATIVE  NEGATIVE mg/dL   Protein, ur NEGATIVE  NEGATIVE mg/dL   Urobilinogen, UA 0.2  0.0 - 1.0 mg/dL   Nitrite NEGATIVE  NEGATIVE   Leukocytes, UA NEGATIVE  NEGATIVE    Comment: MICROSCOPIC NOT DONE ON URINES WITH NEGATIVE PROTEIN, BLOOD, LEUKOCYTES, NITRITE, OR GLUCOSE <1000 mg/dL.    Dg Chest 1 View  08/14/2012   *RADIOLOGY REPORT*  Clinical Data: Epigastric abdominal pain and nausea.  CHEST - 1 VIEW  Comparison: 05/18/2012  Findings: Stable appearance of cardiac pacemaker since previous study.  Shallow inspiration. The heart size and pulmonary vascularity are normal. The lungs appear clear and expanded without focal air space disease or consolidation. No blunting of the costophrenic angles.  No pneumothorax.  Mediastinal contours appear intact.  IMPRESSION: No evidence of active pulmonary disease.   Original Report Authenticated By: Burman Nieves, M.D.   US Abdomen Complete  08/14/2012   *RADIOLOGY REPORT*  Clinical Data:  Abdominal pain  COMPLETE ABDOMINAL ULTRASOUND  Comparison:  CT abdomen dated 02/11/2012  Findings:  Gallbladder:  No gallstones, gallbladder wall thickening, or pericholecystic fluid.  Negative sonographic Murphy's sign.  Common bile duct:  Measures 2 mm.  Liver:  Nodular hepatic contour with heterogeneous parenchymal echotexture, suggesting cirrhosis.  No focal lesion identified.  IVC:  Not visualized.  Pancreas:  Not visualized due to overlying bowel gas.  Spleen:  Measures 4 mm.  Right Kidney:  Measures 10.2 cm.  11 x 13 x 10 mm lateral interpolar cyst.  No hydronephrosis.  Left Kidney:  Measures 10.3 cm.  No mass or hydronephrosis.  Abdominal aorta:  Not visualized.  IMPRESSION: Possible cirrhosis.  13 mm right renal cyst.  Otherwise negative abdominal ultrasound.   Original Report Authenticated By: Charline Bills, M.D.   Ct Abdomen Pelvis W Contrast  08/15/2012   *RADIOLOGY REPORT*  Clinical Data: Mid abdominal pain after eating chicken. Constipation.  CT ABDOMEN AND PELVIS WITH CONTRAST  Technique:  Multidetector CT imaging of the abdomen and pelvis was performed following the standard protocol during bolus administration of intravenous  contrast.  Contrast: OMNIPAQUE IOHEXOL 300 MG/ML  SOLN  Comparison: 02/11/2012  Findings: Mild dependent atelectasis in the lung bases.  The liver, spleen, gallbladder, adrenal glands, inferior vena cava, and retroperitoneal lymph nodes are unremarkable.  Calcification and thrombus in the abdominal aorta without aneurysm.  Small sub centimeter cysts in the kidneys appear stable.  No solid mass or hydronephrosis is identified.  Simple appearing cystic lesion in the body of the pancreas measuring 2 cm diameter.  This is stable in size and appearance on studies dating back to 04/25/2011.  6- month follow-up CT or MRI is recommended to confirm stability.  The gastric wall is not thickened.  Small bowel are not distended. Colon is mostly decompressed.  The right colon is fluid-filled and may demonstrate mild wall thickening although this could be due to incomplete distension.  There is no pericolonic infiltration.  Mild colitis is not excluded. No free air or free fluid in the abdomen. Abdominal wall musculature appears intact.  Pelvis: Calcification in the prostate gland.  Bladder wall is not thickened.  No free or loculated pelvic fluid collections. Rectosigmoid colon is decompressed without evidence of diverticulitis.  The appendix is fluid-filled and demonstrates borderline caliber at 8-11 mm diameter.  There is a small appendicolith at the base.  Minimal infiltration around the appendix.  Changes are nonspecific but could reflect early acute appendicitis.  No evidence of rupture or abscess.  No significant pelvic lymphadenopathy.  Normal alignment of the lumbar spine with mild degenerative changes.  IMPRESSION:  1.  Stable 2 cm diameter cyst in the body of the pancreas.  31-month follow-up is recommended. 2.  Mild thickening of the right colon wall versus under distension.  Mild colitis is not excluded. 3.  Mildly distended fluid filled appendix with minimal inflammatory stranding.  Changes are nonspecific but  suggest early acute appendicitis.  Correlation with physical examination and laboratory values is recommended.   Original Report Authenticated By: Burman Nieves, M.D.    Review of Systems  Constitutional: Negative for chills.  Respiratory: Negative for shortness of breath.   Cardiovascular: Positive for palpitations and leg swelling.  Gastrointestinal: Positive for abdominal pain. Negative for nausea, vomiting, diarrhea and blood in stool.  Genitourinary: Negative.   Musculoskeletal: Negative.   Neurological: Negative.  Negative for weakness.  Psychiatric/Behavioral: Negative.    Blood pressure 140/67, pulse 71, temperature 99.4 F (37.4 C), temperature source Oral, resp. rate 22, SpO2 98.00%. Physical Exam  Constitutional: He is oriented to person, place, and time. He appears well-developed and well-nourished.  Lying in bed, short of breath, chills  HENT:  Head: Normocephalic and atraumatic.  Right Ear: External ear normal.  Left Ear: External ear normal.  Mouth/Throat: No oropharyngeal exudate.  Eyes: Conjunctivae are normal. Pupils are equal, round, and reactive to light. Left eye exhibits no discharge.  Neck: Normal range of motion. Neck supple. No tracheal deviation present.  Cardiovascular: Normal rate, regular rhythm and intact distal pulses.   Murmur heard. Respiratory: Effort normal.  Increased respiratory effort with distant breath sounds  GI: Soft. There is tenderness. There is guarding.  He has a protuberant abdomen. There is tenderness with guarding in the epigastric, right upper quadrant, and right lower quadrant  Musculoskeletal: Normal range of motion. He exhibits edema. He exhibits no tenderness.  Lymphadenopathy:    He has no cervical adenopathy.  Neurological: He is alert and oriented to person, place, and time.  Skin: Skin is warm and dry. No erythema.  Psychiatric: His behavior is normal. Judgment normal.    Assessment/Plan: Abdominal pain of uncertain  etiology  Based on the CAT scan, this may represent early acute appendicitis. I am also worried however that this may represent an ischemic process. He is a very poor surgical candidate given his multiple comorbidities. I will start him on IV antibiotics and try to treat him conservatively. I do not believe that currently he can tolerate general anesthesia. I believe he needs to be admitted to the medical service with both cardiology and pulmonary consulted. If he fails to improve quickly, surgical intervention may be necessary.  Yazhini Mcaulay A 08/15/2012, 3:28 AM

## 2012-08-16 ENCOUNTER — Encounter (HOSPITAL_COMMUNITY)
Admission: EM | Disposition: A | Discharge status: Skilled Nursing Facility | Payer: Self-pay | Source: Home / Self Care | Attending: Internal Medicine

## 2012-08-16 ENCOUNTER — Inpatient Hospital Stay (HOSPITAL_COMMUNITY): Payer: Medicare Other

## 2012-08-16 ENCOUNTER — Inpatient Hospital Stay (HOSPITAL_COMMUNITY): Payer: Medicare Other | Admitting: Anesthesiology

## 2012-08-16 ENCOUNTER — Encounter (HOSPITAL_COMMUNITY): Payer: Self-pay | Admitting: Anesthesiology

## 2012-08-16 DIAGNOSIS — K352 Acute appendicitis with generalized peritonitis, without abscess: Secondary | ICD-10-CM

## 2012-08-16 HISTORY — PX: LAPAROSCOPIC APPENDECTOMY: SHX408

## 2012-08-16 HISTORY — PX: APPENDECTOMY: SHX54

## 2012-08-16 LAB — PROTIME-INR: Prothrombin Time: 18.9 seconds — ABNORMAL HIGH (ref 11.6–15.2)

## 2012-08-16 LAB — GLUCOSE, CAPILLARY
Glucose-Capillary: 143 mg/dL — ABNORMAL HIGH (ref 70–99)
Glucose-Capillary: 183 mg/dL — ABNORMAL HIGH (ref 70–99)
Glucose-Capillary: 118 mg/dL — ABNORMAL HIGH (ref 70–99)

## 2012-08-16 LAB — CBC WITH DIFFERENTIAL/PLATELET
Basophils Absolute: 0 10*3/uL (ref 0.0–0.1)
Basophils Relative: 0 % (ref 0–1)
Eosinophils Absolute: 0.1 10*3/uL (ref 0.0–0.7)
Eosinophils Relative: 1 % (ref 0–5)
HCT: 35.5 % — ABNORMAL LOW (ref 39.0–52.0)
Hemoglobin: 11.6 g/dL — ABNORMAL LOW (ref 13.0–17.0)
Lymphocytes Relative: 8 % — ABNORMAL LOW (ref 12–46)
Lymphs Abs: 1.1 10*3/uL (ref 0.7–4.0)
MCH: 28.2 pg (ref 26.0–34.0)
MCHC: 32.7 g/dL (ref 30.0–36.0)
MCV: 86.4 fL (ref 78.0–100.0)
Monocytes Absolute: 1.1 10*3/uL — ABNORMAL HIGH (ref 0.1–1.0)
Monocytes Relative: 9 % (ref 3–12)
Neutro Abs: 10.4 10*3/uL — ABNORMAL HIGH (ref 1.7–7.7)
Neutrophils Relative %: 82 % — ABNORMAL HIGH (ref 43–77)
Platelets: 107 10*3/uL — ABNORMAL LOW (ref 150–400)
RBC: 4.11 MIL/uL — ABNORMAL LOW (ref 4.22–5.81)
RDW: 16.4 % — ABNORMAL HIGH (ref 11.5–15.5)
WBC: 12.7 10*3/uL — ABNORMAL HIGH (ref 4.0–10.5)

## 2012-08-16 LAB — COMPREHENSIVE METABOLIC PANEL
ALT: 12 U/L (ref 0–53)
AST: 15 U/L (ref 0–37)
Albumin: 3.3 g/dL — ABNORMAL LOW (ref 3.5–5.2)
Alkaline Phosphatase: 47 U/L (ref 39–117)
BUN: 18 mg/dL (ref 6–23)
CO2: 29 mEq/L (ref 19–32)
Calcium: 8.4 mg/dL (ref 8.4–10.5)
Chloride: 102 mEq/L (ref 96–112)
Creatinine, Ser: 1.69 mg/dL — ABNORMAL HIGH (ref 0.50–1.35)
GFR calc Af Amer: 42 mL/min — ABNORMAL LOW (ref >90)
GFR calc non Af Amer: 37 mL/min — ABNORMAL LOW (ref >90)
Glucose, Bld: 144 mg/dL — ABNORMAL HIGH (ref 70–99)
Potassium: 3.7 mEq/L (ref 3.5–5.1)
Sodium: 137 mEq/L (ref 135–145)
Total Bilirubin: 1 mg/dL (ref 0.3–1.2)
Total Protein: 6.8 g/dL (ref 6.0–8.3)

## 2012-08-16 SURGERY — APPENDECTOMY, LAPAROSCOPIC
Anesthesia: General | Site: Abdomen | Wound class: Dirty or Infected

## 2012-08-16 MED ORDER — OXYCODONE HCL 5 MG PO TABS
5.0000 mg | ORAL_TABLET | ORAL | Status: DC | PRN
  Administered 2012-08-17 – 2012-08-23 (×2): 10 mg via ORAL
  Filled 2012-08-16 (×2): qty 2

## 2012-08-16 MED ORDER — TIOTROPIUM BROMIDE MONOHYDRATE 18 MCG IN CAPS
18.0000 ug | ORAL_CAPSULE | Freq: Every day | RESPIRATORY_TRACT | Status: DC
  Administered 2012-08-16 – 2012-08-23 (×8): 18 ug via RESPIRATORY_TRACT
  Filled 2012-08-16 (×3): qty 5

## 2012-08-16 MED ORDER — TAMSULOSIN HCL 0.4 MG PO CAPS
0.4000 mg | ORAL_CAPSULE | Freq: Every day | ORAL | Status: DC
  Administered 2012-08-17 – 2012-08-24 (×7): 0.4 mg via ORAL
  Filled 2012-08-16 (×9): qty 1

## 2012-08-16 MED ORDER — FENTANYL CITRATE 0.05 MG/ML IJ SOLN
INTRAMUSCULAR | Status: AC
  Administered 2012-08-16: 25 ug via INTRAVENOUS
  Filled 2012-08-16: qty 2

## 2012-08-16 MED ORDER — PANTOPRAZOLE SODIUM 40 MG PO TBEC
40.0000 mg | DELAYED_RELEASE_TABLET | Freq: Every day | ORAL | Status: DC
  Administered 2012-08-16 – 2012-08-24 (×7): 40 mg via ORAL
  Filled 2012-08-16 (×7): qty 1

## 2012-08-16 MED ORDER — FENTANYL CITRATE 0.05 MG/ML IJ SOLN: 25.0000 ug | INTRAMUSCULAR | Status: DC | PRN

## 2012-08-16 MED ORDER — MEPERIDINE HCL 25 MG/ML IJ SOLN: 6.2500 mg | INTRAMUSCULAR | Status: DC | PRN

## 2012-08-16 MED ORDER — TRAMADOL HCL 50 MG PO TABS: 50.0000 mg | ORAL_TABLET | Freq: Four times a day (QID) | ORAL | Status: DC | PRN

## 2012-08-16 MED ORDER — OXYCODONE HCL 5 MG/5ML PO SOLN: 5.0000 mg | Freq: Once | ORAL | Status: DC | PRN

## 2012-08-16 MED ORDER — OXYCODONE HCL 5 MG PO TABS: 5.0000 mg | ORAL_TABLET | Freq: Once | ORAL | Status: DC | PRN

## 2012-08-16 MED ORDER — FENTANYL CITRATE 0.05 MG/ML IJ SOLN
INTRAMUSCULAR | Status: DC | PRN
  Administered 2012-08-16 (×2): 50 ug via INTRAVENOUS

## 2012-08-16 MED ORDER — SUCCINYLCHOLINE CHLORIDE 20 MG/ML IJ SOLN
INTRAMUSCULAR | Status: DC | PRN
  Administered 2012-08-16: 100 mg via INTRAVENOUS

## 2012-08-16 MED ORDER — NEOSTIGMINE METHYLSULFATE 1 MG/ML IJ SOLN
INTRAMUSCULAR | Status: DC | PRN
  Administered 2012-08-16: 4 mg via INTRAVENOUS

## 2012-08-16 MED ORDER — IOHEXOL 300 MG/ML  SOLN
25.0000 mL | INTRAMUSCULAR | Status: AC
  Administered 2012-08-16 (×2): 25 mL via ORAL

## 2012-08-16 MED ORDER — PROMETHAZINE HCL 25 MG/ML IJ SOLN: 6.2500 mg | INTRAMUSCULAR | Status: DC | PRN

## 2012-08-16 MED ORDER — SODIUM CHLORIDE 0.9 % IR SOLN
Status: DC | PRN
  Administered 2012-08-16: 1
  Administered 2012-08-16: 1000 mL

## 2012-08-16 MED ORDER — BUPIVACAINE-EPINEPHRINE PF 0.25-1:200000 % IJ SOLN
INTRAMUSCULAR | Status: AC
  Filled 2012-08-16: qty 30

## 2012-08-16 MED ORDER — LIDOCAINE HCL (CARDIAC) 20 MG/ML IV SOLN
INTRAVENOUS | Status: DC | PRN
  Administered 2012-08-16: 50 mg via INTRAVENOUS

## 2012-08-16 MED ORDER — GLYCOPYRROLATE 0.2 MG/ML IJ SOLN
INTRAMUSCULAR | Status: DC | PRN
  Administered 2012-08-16: .8 mg via INTRAVENOUS

## 2012-08-16 MED ORDER — ALBUTEROL SULFATE (5 MG/ML) 0.5% IN NEBU
2.5000 mg | INHALATION_SOLUTION | Freq: Four times a day (QID) | RESPIRATORY_TRACT | Status: DC | PRN
  Administered 2012-08-16 – 2012-08-17 (×2): 2.5 mg via RESPIRATORY_TRACT
  Filled 2012-08-16 (×3): qty 0.5

## 2012-08-16 MED ORDER — ALBUTEROL SULFATE (5 MG/ML) 0.5% IN NEBU
INHALATION_SOLUTION | RESPIRATORY_TRACT | Status: AC
  Filled 2012-08-16: qty 0.5

## 2012-08-16 MED ORDER — 0.9 % SODIUM CHLORIDE (POUR BTL) OPTIME
TOPICAL | Status: DC | PRN
  Administered 2012-08-16: 1000 mL

## 2012-08-16 MED ORDER — PROPOFOL 10 MG/ML IV BOLUS
INTRAVENOUS | Status: DC | PRN
  Administered 2012-08-16: 90 mg via INTRAVENOUS

## 2012-08-16 MED ORDER — PHENYLEPHRINE HCL 10 MG/ML IJ SOLN
10.0000 mg | INTRAVENOUS | Status: DC | PRN
  Administered 2012-08-16: 10 ug/min via INTRAVENOUS
  Administered 2012-08-16: 15 ug/min via INTRAVENOUS

## 2012-08-16 MED ORDER — FUROSEMIDE 10 MG/ML IJ SOLN
40.0000 mg | Freq: Once | INTRAMUSCULAR | Status: AC
  Administered 2012-08-16: 40 mg via INTRAVENOUS
  Filled 2012-08-16: qty 4

## 2012-08-16 MED ORDER — ALBUTEROL SULFATE (5 MG/ML) 0.5% IN NEBU
2.5000 mg | INHALATION_SOLUTION | Freq: Once | RESPIRATORY_TRACT | Status: AC
  Administered 2012-08-16: 2.5 mg via RESPIRATORY_TRACT

## 2012-08-16 MED ORDER — ROCURONIUM BROMIDE 100 MG/10ML IV SOLN
INTRAVENOUS | Status: DC | PRN
  Administered 2012-08-16: 20 mg via INTRAVENOUS

## 2012-08-16 MED ORDER — BUPIVACAINE-EPINEPHRINE 0.25% -1:200000 IJ SOLN
INTRAMUSCULAR | Status: DC | PRN
  Administered 2012-08-16: 15 mL

## 2012-08-16 MED ORDER — PHENYLEPHRINE HCL 10 MG/ML IJ SOLN
30.0000 ug/min | INTRAMUSCULAR | Status: DC
  Filled 2012-08-16: qty 1

## 2012-08-16 MED ORDER — ONDANSETRON HCL 4 MG/2ML IJ SOLN
INTRAMUSCULAR | Status: DC | PRN
  Administered 2012-08-16: 4 mg via INTRAVENOUS

## 2012-08-16 MED ORDER — LACTATED RINGERS IV SOLN
INTRAVENOUS | Status: DC | PRN
  Administered 2012-08-16: 16:00:00 via INTRAVENOUS

## 2012-08-16 MED ORDER — MIDAZOLAM HCL 2 MG/2ML IJ SOLN: 0.5000 mg | Freq: Once | INTRAMUSCULAR | Status: DC | PRN

## 2012-08-16 SURGICAL SUPPLY — 61 items
ADH SKN CLS APL DERMABOND .7 (GAUZE/BANDAGES/DRESSINGS) ×1
APPLIER CLIP ROT 10 11.4 M/L (STAPLE)
APR CLP MED LRG 11.4X10 (STAPLE)
BAG SPEC RTRVL LRG 6X4 10 (ENDOMECHANICALS) ×1
BLADE SURG ROTATE 9660 (MISCELLANEOUS) IMPLANT
CANISTER SUCTION 2500CC (MISCELLANEOUS) ×2 IMPLANT
CHLORAPREP W/TINT 26ML (MISCELLANEOUS) ×2 IMPLANT
CLIP APPLIE ROT 10 11.4 M/L (STAPLE) IMPLANT
CLOTH BEACON ORANGE TIMEOUT ST (SAFETY) ×2 IMPLANT
COVER SURGICAL LIGHT HANDLE (MISCELLANEOUS) ×2 IMPLANT
CUTTER LINEAR ENDO 35 ETS (STAPLE) IMPLANT
CUTTER LINEAR ENDO 35 ETS TH (STAPLE) ×2 IMPLANT
DECANTER SPIKE VIAL GLASS SM (MISCELLANEOUS) ×2 IMPLANT
DERMABOND ADVANCED (GAUZE/BANDAGES/DRESSINGS) ×1
DERMABOND ADVANCED .7 DNX12 (GAUZE/BANDAGES/DRESSINGS) ×1 IMPLANT
DRAIN CHANNEL 19F RND (DRAIN) ×2 IMPLANT
DRAPE UTILITY 15X26 W/TAPE STR (DRAPE) ×4 IMPLANT
ELECT REM PT RETURN 9FT ADLT (ELECTROSURGICAL) ×2
ELECTRODE REM PT RTRN 9FT ADLT (ELECTROSURGICAL) ×1 IMPLANT
ENDOLOOP SUT PDS II  0 18 (SUTURE)
ENDOLOOP SUT PDS II 0 18 (SUTURE) IMPLANT
EVACUATOR SILICONE 100CC (DRAIN) ×2 IMPLANT
GLOVE BIO SURGEON STRL SZ 6.5 (GLOVE) ×2 IMPLANT
GLOVE BIO SURGEON STRL SZ8 (GLOVE) ×2 IMPLANT
GLOVE BIOGEL PI IND STRL 6.5 (GLOVE) ×3 IMPLANT
GLOVE BIOGEL PI IND STRL 7.0 (GLOVE) ×1 IMPLANT
GLOVE BIOGEL PI IND STRL 7.5 (GLOVE) ×2 IMPLANT
GLOVE BIOGEL PI IND STRL 8 (GLOVE) ×1 IMPLANT
GLOVE BIOGEL PI INDICATOR 6.5 (GLOVE) ×3
GLOVE BIOGEL PI INDICATOR 7.0 (GLOVE) ×1
GLOVE BIOGEL PI INDICATOR 7.5 (GLOVE) ×2
GLOVE BIOGEL PI INDICATOR 8 (GLOVE) ×1
GLOVE SURG SS PI 7.0 STRL IVOR (GLOVE) ×4 IMPLANT
GOWN STRL NON-REIN LRG LVL3 (GOWN DISPOSABLE) ×4 IMPLANT
GOWN STRL REIN XL XLG (GOWN DISPOSABLE) ×2 IMPLANT
KIT BASIN OR (CUSTOM PROCEDURE TRAY) ×2 IMPLANT
KIT ROOM TURNOVER OR (KITS) ×2 IMPLANT
NS IRRIG 1000ML POUR BTL (IV SOLUTION) ×2 IMPLANT
PAD ARMBOARD 7.5X6 YLW CONV (MISCELLANEOUS) ×4 IMPLANT
POUCH SPECIMEN RETRIEVAL 10MM (ENDOMECHANICALS) ×2 IMPLANT
RELOAD /EVU35 (ENDOMECHANICALS) IMPLANT
RELOAD CUTTER ETS 35MM STAND (ENDOMECHANICALS) IMPLANT
SCALPEL HARMONIC ACE (MISCELLANEOUS) ×2 IMPLANT
SET IRRIG TUBING LAPAROSCOPIC (IRRIGATION / IRRIGATOR) ×2 IMPLANT
SPECIMEN JAR SMALL (MISCELLANEOUS) ×2 IMPLANT
SPONGE GAUZE 4X4 12PLY (GAUZE/BANDAGES/DRESSINGS) ×2 IMPLANT
SUT ETHILON 3 0 FSL (SUTURE) ×2 IMPLANT
SUT VIC AB 3-0 SH 27 (SUTURE) ×2
SUT VIC AB 3-0 SH 27X BRD (SUTURE) ×1 IMPLANT
SUT VIC AB 4-0 PS2 27 (SUTURE) ×2 IMPLANT
SWAB COLLECTION DEVICE MRSA (MISCELLANEOUS) IMPLANT
TAPE PAPER 3X10 WHT MICROPORE (GAUZE/BANDAGES/DRESSINGS) ×2 IMPLANT
TOWEL OR 17X24 6PK STRL BLUE (TOWEL DISPOSABLE) ×2 IMPLANT
TOWEL OR 17X26 10 PK STRL BLUE (TOWEL DISPOSABLE) ×2 IMPLANT
TRAY FOLEY CATH 16FRSI W/METER (SET/KITS/TRAYS/PACK) ×2 IMPLANT
TRAY LAPAROSCOPIC (CUSTOM PROCEDURE TRAY) ×2 IMPLANT
TROCAR XCEL 12X100 BLDLESS (ENDOMECHANICALS) ×2 IMPLANT
TROCAR XCEL BLUNT TIP 100MML (ENDOMECHANICALS) ×2 IMPLANT
TROCAR XCEL NON-BLD 5MMX100MML (ENDOMECHANICALS) ×2 IMPLANT
TUBE ANAEROBIC SPECIMEN COL (MISCELLANEOUS) IMPLANT
WATER STERILE IRR 1000ML POUR (IV SOLUTION) ×2 IMPLANT

## 2012-08-16 NOTE — Progress Notes (Signed)
Patient ID: Robert Davies, male   DOB: December 02, 1930, 79 y.o.   MRN: [id number] F/U CT shows clear evidence of appendicitis.  I D/W Dr. Donette Larry and he feels [name] is medically optimized - as good as possible.  Will proceed to OR for laparoscopic appendectomy.  Procedure, risks, and benefits D/W patient and he agrees. Violeta Gelinas, MD, MPH, FACS Pager: [phone]

## 2012-08-16 NOTE — Anesthesia Postprocedure Evaluation (Signed)
  Anesthesia Post-op Note  Patient: Robert Davies  Procedure(s) Performed: Procedure(s): APPENDECTOMY LAPAROSCOPIC (N/A)  Patient Location: PACU  Anesthesia Type:General  Level of Consciousness: awake and alert   Airway and Oxygen Therapy: Patient Spontanous Breathing  Post-op Pain: none  Post-op Assessment: Post-op Vital signs reviewed, Patient's Cardiovascular Status Stable, Respiratory Function Stable, Patent Airway, No signs of Nausea or vomiting and Pain level controlled  Post-op Vital Signs: stable  Complications: No apparent anesthesia complications

## 2012-08-16 NOTE — Transfer of Care (Signed)
Immediate Anesthesia Transfer of Care Note  Patient: Robert Davies  Procedure(s) Performed: Procedure(s): APPENDECTOMY LAPAROSCOPIC (N/A)  Patient Location: PACU  Anesthesia Type:General  Level of Consciousness: awake, alert  and oriented  Airway & Oxygen Therapy: Patient Spontanous Breathing and Patient connected to face mask oxygen  Post-op Assessment: Report given to PACU RN and Post -op Vital signs reviewed and stable  Post vital signs: Reviewed and stable  Complications: No apparent anesthesia complications

## 2012-08-16 NOTE — Progress Notes (Signed)
Patient being transporting to Surgery

## 2012-08-16 NOTE — Op Note (Signed)
08/14/2012 - 08/16/2012  4:56 PM  PATIENT:  Melissa Montane  79 y.o. male  PRE-OPERATIVE DIAGNOSIS:  appendicitis  POST-OPERATIVE DIAGNOSIS:  Perforated appendicitis  PROCEDURE:  Procedure(s): APPENDECTOMY LAPAROSCOPIC  SURGEON:  Surgeon(s): Liz Malady, MD  PHYSICIAN ASSISTANT:   ASSISTANTS: Magnus Ivan, RNFA  ANESTHESIA:   local and general  EBL:  Total I/O In: 50 [I.V.:50] Out: 300 [Urine:300]  BLOOD ADMINISTERED:none  DRAINS: (1) Maura-Pratt drain(s) with closed bulb suction in the R pelvis   SPECIMEN:  Excision  DISPOSITION OF SPECIMEN:  PATHOLOGY  COUNTS:  YES  DICTATION: #[id number]  PATIENT DISPOSITION:  PACU - hemodynamically stable.   Delay start of Pharmacological VTE agent (>24hrs) due to surgical blood loss or risk of bleeding:  no  Violeta Gelinas, MD, MPH, FACS Pager: [phone]  7/28/20144:56 PM

## 2012-08-16 NOTE — Progress Notes (Signed)
Pt received from PACU on 3L [place] with sat at 91%. Shortly after sat dropped to 74%, placed patient on 50% venti mask, RR high 20s-low 30s. Lung sounds diminished throughout with expiratory wheezes in left upper lobe. Dr. Valentina Lucks notified of change. CXR and albuterol treatment ordered. Dr. Valentina Lucks notified with CXR results. New order for one time dose of 40 mg of IV lasix received. Will continue to monitor.   Rochele Pages, RN

## 2012-08-16 NOTE — Preoperative (Signed)
Beta Blockers   Reason not to administer Beta Blockers:Not Applicable 

## 2012-08-16 NOTE — Progress Notes (Signed)
Patient received from PACU with bruising around 3rd lap port site. Center near insertion site hard. Abdomen tender. Ecchymotic area marked with PACU RN at bedside. At 2200 check, ecchymotic area had grown past perviously marked area roughly measuring 6.5 cm x 5 cm. Center near incision site still hard. Dr. Andrey Campanile notified. Ordered to watch and update if incision site begins to ooze or bleed. Will continue to monitor.   Rochele Pages, RN

## 2012-08-16 NOTE — Progress Notes (Signed)
Echocardiogram 2D Echocardiogram has been performed.  Robert Davies 08/16/2012, 11:08 AM

## 2012-08-16 NOTE — Progress Notes (Addendum)
Subjective: Sitting up in chair, tolerating clears, says abdominal pain is much better  Objective: Vital signs in last 24 hours: Temp:  [97.9 F (36.6 C)-100.5 F (38.1 C)] 98 F (36.7 C) (07/28 0749) Pulse Rate:  [66-80] 66 (07/28 0322) Resp:  [21-29] 26 (07/28 0322) BP: (100-117)/(42-76) 104/59 mmHg (07/28 0322) SpO2:  [89 %-99 %] 92 % (07/28 0322) Weight:  [90.2 kg (198 lb 13.7 oz)] 90.2 kg (198 lb 13.7 oz) (07/28 0322)    Intake/Output from previous day: 07/27 0701 - 07/28 0700 In: 164.2 [I.V.:114.2; IV Piggyback:50] Out: 950 [Urine:950] Intake/Output this shift: Total I/O In: -  Out: 300 [Urine:300]  General appearance: cooperative Resp: clear to auscultation bilaterally Cardio: paced reg GI: soft, mild epigastric tenderness, no RLQ tenderness Neuro: appropriate, F/C  Lab Results:   Recent Labs  08/15/12 0955 08/16/12 0535  WBC 13.4* 12.7*  HGB 11.6* 11.6*  HCT 36.4* 35.5*  PLT 121* 107*   BMET  Recent Labs  08/15/12 0955 08/16/12 0535  NA 138 137  K 3.9 3.7  CL 103 102  CO2 25 29  GLUCOSE 128* 144*  BUN 19 18  CREATININE 1.49* 1.69*  CALCIUM 8.4 8.4   PT/INR  Recent Labs  08/15/12 0955 08/16/12 0535  LABPROT 20.3* 18.9*  INR 1.79* 1.63*   ABG No results found for this basename: PHART, PCO2, PO2, HCO3,  in the last 72 hours  Studies/Results: Dg Chest 1 View  08/14/2012   *RADIOLOGY REPORT*  Clinical Data: Epigastric abdominal pain and nausea.  CHEST - 1 VIEW  Comparison: 05/18/2012  Findings: Stable appearance of cardiac pacemaker since previous study.  Shallow inspiration. The heart size and pulmonary vascularity are normal. The lungs appear clear and expanded without focal air space disease or consolidation. No blunting of the costophrenic angles.  No pneumothorax.  Mediastinal contours appear intact.  IMPRESSION: No evidence of active pulmonary disease.   Original Report Authenticated By: Burman Nieves, M.D.   US Abdomen  Complete  08/14/2012   *RADIOLOGY REPORT*  Clinical Data:  Abdominal pain  COMPLETE ABDOMINAL ULTRASOUND  Comparison:  CT abdomen dated 02/11/2012  Findings:  Gallbladder:  No gallstones, gallbladder wall thickening, or pericholecystic fluid.  Negative sonographic Murphy's sign.  Common bile duct:  Measures 2 mm.  Liver:  Nodular hepatic contour with heterogeneous parenchymal echotexture, suggesting cirrhosis.  No focal lesion identified.  IVC:  Not visualized.  Pancreas:  Not visualized due to overlying bowel gas.  Spleen:  Measures 4 mm.  Right Kidney:  Measures 10.2 cm.  11 x 13 x 10 mm lateral interpolar cyst.  No hydronephrosis.  Left Kidney:  Measures 10.3 cm.  No mass or hydronephrosis.  Abdominal aorta:  Not visualized.  IMPRESSION: Possible cirrhosis.  13 mm right renal cyst.  Otherwise negative abdominal ultrasound.   Original Report Authenticated By: Charline Bills, M.D.   Ct Abdomen Pelvis W Contrast  08/15/2012   *RADIOLOGY REPORT*  Clinical Data: Mid abdominal pain after eating chicken. Constipation.  CT ABDOMEN AND PELVIS WITH CONTRAST  Technique:  Multidetector CT imaging of the abdomen and pelvis was performed following the standard protocol during bolus administration of intravenous contrast.  Contrast: OMNIPAQUE IOHEXOL 300 MG/ML  SOLN  Comparison: 02/11/2012  Findings: Mild dependent atelectasis in the lung bases.  The liver, spleen, gallbladder, adrenal glands, inferior vena cava, and retroperitoneal lymph nodes are unremarkable.  Calcification and thrombus in the abdominal aorta without aneurysm.  Small sub centimeter cysts in the kidneys appear  stable.  No solid mass or hydronephrosis is identified.  Simple appearing cystic lesion in the body of the pancreas measuring 2 cm diameter.  This is stable in size and appearance on studies dating back to 04/25/2011.  6- month follow-up CT or MRI is recommended to confirm stability.  The gastric wall is not thickened.  Small bowel are not  distended. Colon is mostly decompressed.  The right colon is fluid-filled and may demonstrate mild wall thickening although this could be due to incomplete distension.  There is no pericolonic infiltration.  Mild colitis is not excluded. No free air or free fluid in the abdomen. Abdominal wall musculature appears intact.  Pelvis: Calcification in the prostate gland.  Bladder wall is not thickened.  No free or loculated pelvic fluid collections. Rectosigmoid colon is decompressed without evidence of diverticulitis.  The appendix is fluid-filled and demonstrates borderline caliber at 8-11 mm diameter.  There is a small appendicolith at the base.  Minimal infiltration around the appendix.  Changes are nonspecific but could reflect early acute appendicitis.  No evidence of rupture or abscess.  No significant pelvic lymphadenopathy.  Normal alignment of the lumbar spine with mild degenerative changes.  IMPRESSION:  1.  Stable 2 cm diameter cyst in the body of the pancreas.  64-month follow-up is recommended. 2.  Mild thickening of the right colon wall versus under distension.  Mild colitis is not excluded. 3.  Mildly distended fluid filled appendix with minimal inflammatory stranding.  Changes are nonspecific but suggest early acute appendicitis.  Correlation with physical examination and laboratory values is recommended.   Original Report Authenticated By: Burman Nieves, M.D.    Anti-infectives: Anti-infectives   Start     Dose/Rate Route Frequency Ordered Stop   08/15/12 0337  piperacillin-tazobactam (ZOSYN) IVPB 3.375 g     3.375 g 12.5 mL/hr over 240 Minutes Intravenous 3 times per day 08/15/12 1478        Assessment/Plan: Abdominal pain - seems improved, only tenderness is epigastric now, noted plan for repeat CT abdomen by primary service.  Will make NPO in case something surgical is seen on CT.  Patient is moderate to high cardiac risk for surgery. ID - empiric Zosyn, WBC down slightly  LOS: 2 days     Marston Mccadden E 08/16/2012

## 2012-08-16 NOTE — Progress Notes (Signed)
Utilization review completed.  

## 2012-08-16 NOTE — Anesthesia Preprocedure Evaluation (Addendum)
Anesthesia Evaluation  Patient identified by MRN, date of birth, ID band Patient awake    Reviewed: Allergy & Precautions, H&P , NPO status , Patient's Chart, lab work & pertinent test results, reviewed documented beta blocker date and time   Airway Mallampati: II TM Distance: >3 FB Neck ROM: Full    Dental  (+) Edentulous Upper and Edentulous Lower   Pulmonary COPD COPD inhaler and oxygen dependent, former smoker (quit a year ago),  breath sounds clear to auscultation  Pulmonary exam normal       Cardiovascular hypertension, Pt. on medications and Pt. on home beta blockers + Past MI + Cardiac Defibrillator Rate:Normal  '11 ECHO: EF 35-40%, grade 2 diastolic dysfunction   Neuro/Psych TIA   GI/Hepatic PUD, GERD-  Medicated and Controlled,  Endo/Other  diabetes (glu 115), Type 2, Oral Hypoglycemic AgentsMorbid obesity  Renal/GU Renal InsufficiencyRenal disease (creat 1.69)     Musculoskeletal   Abdominal (+) + obese,   Peds  Hematology Coumadin INR 1.63   Anesthesia Other Findings   Reproductive/Obstetrics                          Anesthesia Physical Anesthesia Plan  ASA: IV  Anesthesia Plan: General   Post-op Pain Management:    Induction: Intravenous  Airway Management Planned: Oral ETT  Additional Equipment:   Intra-op Plan:   Post-operative Plan: Possible Post-op intubation/ventilation  Informed Consent: I have reviewed the patients History and Physical, chart, labs and discussed the procedure including the risks, benefits and alternatives for the proposed anesthesia with the patient or authorized representative who has indicated his/her understanding and acceptance.     Plan Discussed with: CRNA and Surgeon  Anesthesia Plan Comments: (Plan routine monitors, GETA )        Anesthesia Quick Evaluation

## 2012-08-16 NOTE — Anesthesia Procedure Notes (Signed)
Procedure Name: Intubation Date/Time: 08/16/2012 4:13 PM Performed by: Gwenyth Allegra Pre-anesthesia Checklist: Emergency Drugs available, Patient identified, Timeout performed, Suction available and Patient being monitored Patient Re-evaluated:Patient Re-evaluated prior to inductionOxygen Delivery Method: Circle system utilized Preoxygenation: Pre-oxygenation with 100% oxygen Intubation Type: IV induction, Rapid sequence and Cricoid Pressure applied Laryngoscope Size: Mac and 4 Grade View: Grade I Tube type: Oral Number of attempts: 1 Airway Equipment and Method: Stylet Placement Confirmation: ETT inserted through vocal cords under direct vision,  positive ETCO2 and breath sounds checked- equal and bilateral Secured at: 22 cm Tube secured with: Tape Dental Injury: Teeth and Oropharynx as per pre-operative assessment

## 2012-08-16 NOTE — Progress Notes (Signed)
Subjective:  Sitting up in chair, getting bath, no current complaints of abdominal discomfort. Drinking contrast for abdominal CT.  Objective:  Vital Signs in the last 24 hours: Temp:  [97.9 F (36.6 C)-100.5 F (38.1 C)] 98 F (36.7 C) (07/28 0749) Pulse Rate:  [66-80] 67 (07/28 0751) Resp:  [21-29] 25 (07/28 0751) BP: (100-117)/(42-76) 109/56 mmHg (07/28 0751) SpO2:  [89 %-99 %] 95 % (07/28 0906) Weight:  [90.2 kg (198 lb 13.7 oz)] 90.2 kg (198 lb 13.7 oz) (07/28 0322)  Intake/Output from previous day: 07/27 0701 - 07/28 0700 In: 164.2 [I.V.:114.2; IV Piggyback:50] Out: 950 [Urine:950]   Physical Exam: General: Well developed, well nourished, in no acute distress. Head:  Normocephalic and atraumatic. Lungs: Clear to auscultation and percussion. Heart: Normal S1 and S2.  No murmur, rubs or gallops.  Abdomen: soft, non-tender, positive bowel sounds. Obese Extremities: No clubbing or cyanosis. No edema. Neurologic: Alert and oriented x 3.    Lab Results:  Recent Labs  08/15/12 0955 08/16/12 0535  WBC 13.4* 12.7*  HGB 11.6* 11.6*  PLT 121* 107*    Recent Labs  08/15/12 0955 08/16/12 0535  NA 138 137  K 3.9 3.7  CL 103 102  CO2 25 29  GLUCOSE 128* 144*  BUN 19 18  CREATININE 1.49* 1.69*   Hepatic Function Panel  Recent Labs  08/15/12 0955 08/16/12 0535  PROT 6.8 6.8  ALBUMIN 3.5 3.3*  AST 18 15  ALT 15 12  ALKPHOS 45 47  BILITOT 0.8 1.0  BILIDIR 0.2  --   IBILI 0.6  --   Imaging: Dg Chest 1 View  08/14/2012   *RADIOLOGY REPORT*  Clinical Data: Epigastric abdominal pain and nausea.  CHEST - 1 VIEW  Comparison: 05/18/2012  Findings: Stable appearance of cardiac pacemaker since previous study.  Shallow inspiration. The heart size and pulmonary vascularity are normal. The lungs appear clear and expanded without focal air space disease or consolidation. No blunting of the costophrenic angles.  No pneumothorax.  Mediastinal contours appear intact.   IMPRESSION: No evidence of active pulmonary disease.   Original Report Authenticated By: Burman Nieves, M.D.   US Abdomen Complete  08/14/2012   *RADIOLOGY REPORT*  Clinical Data:  Abdominal pain  COMPLETE ABDOMINAL ULTRASOUND  Comparison:  CT abdomen dated 02/11/2012  Findings:  Gallbladder:  No gallstones, gallbladder wall thickening, or pericholecystic fluid.  Negative sonographic Murphy's sign.  Common bile duct:  Measures 2 mm.  Liver:  Nodular hepatic contour with heterogeneous parenchymal echotexture, suggesting cirrhosis.  No focal lesion identified.  IVC:  Not visualized.  Pancreas:  Not visualized due to overlying bowel gas.  Spleen:  Measures 4 mm.  Right Kidney:  Measures 10.2 cm.  11 x 13 x 10 mm lateral interpolar cyst.  No hydronephrosis.  Left Kidney:  Measures 10.3 cm.  No mass or hydronephrosis.  Abdominal aorta:  Not visualized.  IMPRESSION: Possible cirrhosis.  13 mm right renal cyst.  Otherwise negative abdominal ultrasound.   Original Report Authenticated By: Charline Bills, M.D.   Ct Abdomen Pelvis W Contrast  08/15/2012   *RADIOLOGY REPORT*  Clinical Data: Mid abdominal pain after eating chicken. Constipation.  CT ABDOMEN AND PELVIS WITH CONTRAST  Technique:  Multidetector CT imaging of the abdomen and pelvis was performed following the standard protocol during bolus administration of intravenous contrast.  Contrast: OMNIPAQUE IOHEXOL 300 MG/ML  SOLN  Comparison: 02/11/2012  Findings: Mild dependent atelectasis in the lung bases.  The liver, spleen,  gallbladder, adrenal glands, inferior vena cava, and retroperitoneal lymph nodes are unremarkable.  Calcification and thrombus in the abdominal aorta without aneurysm.  Small sub centimeter cysts in the kidneys appear stable.  No solid mass or hydronephrosis is identified.  Simple appearing cystic lesion in the body of the pancreas measuring 2 cm diameter.  This is stable in size and appearance on studies dating back to 04/25/2011.   6- month follow-up CT or MRI is recommended to confirm stability.  The gastric wall is not thickened.  Small bowel are not distended. Colon is mostly decompressed.  The right colon is fluid-filled and may demonstrate mild wall thickening although this could be due to incomplete distension.  There is no pericolonic infiltration.  Mild colitis is not excluded. No free air or free fluid in the abdomen. Abdominal wall musculature appears intact.  Pelvis: Calcification in the prostate gland.  Bladder wall is not thickened.  No free or loculated pelvic fluid collections. Rectosigmoid colon is decompressed without evidence of diverticulitis.  The appendix is fluid-filled and demonstrates borderline caliber at 8-11 mm diameter.  There is a small appendicolith at the base.  Minimal infiltration around the appendix.  Changes are nonspecific but could reflect early acute appendicitis.  No evidence of rupture or abscess.  No significant pelvic lymphadenopathy.  Normal alignment of the lumbar spine with mild degenerative changes.  IMPRESSION:  1.  Stable 2 cm diameter cyst in the body of the pancreas.  1-month follow-up is recommended. 2.  Mild thickening of the right colon wall versus under distension.  Mild colitis is not excluded. 3.  Mildly distended fluid filled appendix with minimal inflammatory stranding.  Changes are nonspecific but suggest early acute appendicitis.  Correlation with physical examination and laboratory values is recommended.   Original Report Authenticated By: Burman Nieves, M.D.    Telemetry: Ventricular paced rhythm. Personally viewed.   Cardiac Studies:  Ejection fraction 35-40%, echocardiogram 2/11  Assessment/Plan:   79 year old male with nonischemic cardiomyopathy, abdominal pain, chronic systolic heart failure, anticoagulation, COPD.  1. Chronic systolic heart failure-appears well compensated. No changes made. Be careful with fluid administration. We will reassess ejection fraction.  Order or echocardiogram.  2. ICD-seems to be functioning well, ventricular pacing on telemetry.  3. Abdominal discomfort-seems to be improved. Gen. surgery following. CT scan today.  4. COPD-pulmonology note reviewed. Chronic. No active exacerbation.  5. Chronic anticoagulation-INR 1.63 currently. Mild anemia hemoglobin 11.6. White count improving down to 12.7.  I will review echocardiogram.  I reviewed consultation note from yesterday. Agree that he has had no recent heart failure exacerbations, no ICD shocks, compliance. He needs to have surgery for an acute abdominal process, cardiac risk is not too high to proceed.  I will sign off, please call with any questions.  SKAINS, MARK 08/16/2012, 9:44 AM

## 2012-08-16 NOTE — Progress Notes (Signed)
Subjective: Pt still c/o of Abdominal pain some better Fever of 100   Objective: Vital signs in last 24 hours: Temp:  [97.9 F (36.6 C)-100.5 F (38.1 C)] 100.4 F (38 C) (07/28 0322) Pulse Rate:  [66-85] 66 (07/28 0322) Resp:  [21-29] 26 (07/28 0322) BP: (100-121)/(42-94) 104/59 mmHg (07/28 0322) SpO2:  [89 %-99 %] 92 % (07/28 0322) Weight:  [90.2 kg (198 lb 13.7 oz)] 90.2 kg (198 lb 13.7 oz) (07/28 0322) Weight change: 0.4 kg (14.1 oz)    Intake/Output from previous day: 07/27 0701 - 07/28 0700 In: 164.2 [I.V.:114.2; IV Piggyback:50] Out: 950 [Urine:950] Intake/Output this shift:    General appearance: alert Resp: clear to auscultation bilaterally GI: soft BS present; tender in right side upper and lower area  Lab Results:  Recent Labs  08/15/12 0955 08/16/12 0535  WBC 13.4* 12.7*  HGB 11.6* 11.6*  HCT 36.4* 35.5*  PLT 121* PENDING   BMET  Recent Labs  08/15/12 0955 08/16/12 0535  NA 138 137  K 3.9 3.7  CL 103 102  CO2 25 29  GLUCOSE 128* 144*  BUN 19 18  CREATININE 1.49* 1.69*  CALCIUM 8.4 8.4    Studies/Results: Dg Chest 1 View  08/14/2012   *RADIOLOGY REPORT*  Clinical Data: Epigastric abdominal pain and nausea.  CHEST - 1 VIEW  Comparison: 05/18/2012  Findings: Stable appearance of cardiac pacemaker since previous study.  Shallow inspiration. The heart size and pulmonary vascularity are normal. The lungs appear clear and expanded without focal air space disease or consolidation. No blunting of the costophrenic angles.  No pneumothorax.  Mediastinal contours appear intact.  IMPRESSION: No evidence of active pulmonary disease.   Original Report Authenticated By: Burman Nieves, M.D.   US Abdomen Complete  08/14/2012   *RADIOLOGY REPORT*  Clinical Data:  Abdominal pain  COMPLETE ABDOMINAL ULTRASOUND  Comparison:  CT abdomen dated 02/11/2012  Findings:  Gallbladder:  No gallstones, gallbladder wall thickening, or pericholecystic fluid.  Negative  sonographic Murphy's sign.  Common bile duct:  Measures 2 mm.  Liver:  Nodular hepatic contour with heterogeneous parenchymal echotexture, suggesting cirrhosis.  No focal lesion identified.  IVC:  Not visualized.  Pancreas:  Not visualized due to overlying bowel gas.  Spleen:  Measures 4 mm.  Right Kidney:  Measures 10.2 cm.  11 x 13 x 10 mm lateral interpolar cyst.  No hydronephrosis.  Left Kidney:  Measures 10.3 cm.  No mass or hydronephrosis.  Abdominal aorta:  Not visualized.  IMPRESSION: Possible cirrhosis.  13 mm right renal cyst.  Otherwise negative abdominal ultrasound.   Original Report Authenticated By: Charline Bills, M.D.   Ct Abdomen Pelvis W Contrast  08/15/2012   *RADIOLOGY REPORT*  Clinical Data: Mid abdominal pain after eating chicken. Constipation.  CT ABDOMEN AND PELVIS WITH CONTRAST  Technique:  Multidetector CT imaging of the abdomen and pelvis was performed following the standard protocol during bolus administration of intravenous contrast.  Contrast: OMNIPAQUE IOHEXOL 300 MG/ML  SOLN  Comparison: 02/11/2012  Findings: Mild dependent atelectasis in the lung bases.  The liver, spleen, gallbladder, adrenal glands, inferior vena cava, and retroperitoneal lymph nodes are unremarkable.  Calcification and thrombus in the abdominal aorta without aneurysm.  Small sub centimeter cysts in the kidneys appear stable.  No solid mass or hydronephrosis is identified.  Simple appearing cystic lesion in the body of the pancreas measuring 2 cm diameter.  This is stable in size and appearance on studies dating back to 04/25/2011.  6- month follow-up CT or MRI is recommended to confirm stability.  The gastric wall is not thickened.  Small bowel are not distended. Colon is mostly decompressed.  The right colon is fluid-filled and may demonstrate mild wall thickening although this could be due to incomplete distension.  There is no pericolonic infiltration.  Mild colitis is not excluded. No free air or  free fluid in the abdomen. Abdominal wall musculature appears intact.  Pelvis: Calcification in the prostate gland.  Bladder wall is not thickened.  No free or loculated pelvic fluid collections. Rectosigmoid colon is decompressed without evidence of diverticulitis.  The appendix is fluid-filled and demonstrates borderline caliber at 8-11 mm diameter.  There is a small appendicolith at the base.  Minimal infiltration around the appendix.  Changes are nonspecific but could reflect early acute appendicitis.  No evidence of rupture or abscess.  No significant pelvic lymphadenopathy.  Normal alignment of the lumbar spine with mild degenerative changes.  IMPRESSION:  1.  Stable 2 cm diameter cyst in the body of the pancreas.  67-month follow-up is recommended. 2.  Mild thickening of the right colon wall versus under distension.  Mild colitis is not excluded. 3.  Mildly distended fluid filled appendix with minimal inflammatory stranding.  Changes are nonspecific but suggest early acute appendicitis.  Correlation with physical examination and laboratory values is recommended.   Original Report Authenticated By: Burman Nieves, M.D.    Medications: I have reviewed the patient's current medications.  Assessment/Plan: Abdominal pain- predominantly right side - DDX colitis/  PSBO- with fever now - repeat CT scan Surgery f/u- continue clears today- ABX Cardiac and pul input noted- moderate high risk for Surgery COPD- restart on spiriva/ alb neb prn CAD/ CHF/ AICD/ clinically stable- coumadin on hold HTN- BP low side- hold ACE and BB DM- SS insulin- po med on hold- BS - ok Ambulate today CKD stage 3 cr stable baseline .  LOS: 2 days   Robert Davies 08/16/2012, 7:24 AM

## 2012-08-17 ENCOUNTER — Encounter (HOSPITAL_COMMUNITY): Payer: Self-pay | Admitting: General Surgery

## 2012-08-17 LAB — CBC
Hemoglobin: 11.8 g/dL — ABNORMAL LOW (ref 13.0–17.0)
MCH: 28.3 pg (ref 26.0–34.0)
MCHC: 32.9 g/dL (ref 30.0–36.0)
RDW: 16.4 % — ABNORMAL HIGH (ref 11.5–15.5)

## 2012-08-17 LAB — BASIC METABOLIC PANEL
Calcium: 8.4 mg/dL (ref 8.4–10.5)
GFR calc Af Amer: 36 mL/min — ABNORMAL LOW (ref >90)
GFR calc non Af Amer: 31 mL/min — ABNORMAL LOW (ref >90)
Glucose, Bld: 130 mg/dL — ABNORMAL HIGH (ref 70–99)
Sodium: 137 mEq/L (ref 135–145)

## 2012-08-17 LAB — GLUCOSE, CAPILLARY
Glucose-Capillary: 130 mg/dL — ABNORMAL HIGH (ref 70–99)
Glucose-Capillary: 187 mg/dL — ABNORMAL HIGH (ref 70–99)

## 2012-08-17 MED ORDER — BUPIVACAINE-EPINEPHRINE PF 0.25-1:200000 % IJ SOLN
INTRAMUSCULAR | Status: AC
  Filled 2012-08-17: qty 30

## 2012-08-17 NOTE — Progress Notes (Signed)
Patient ID: KAYTON DUNAJ, male   DOB: August 14, 1930, 79 y.o.   MRN: [id number] 1 Day Post-Op  Subjective: Pt c/o pain and soreness this morning, but already sitting up in a chair.  Passing minimal flatus.  No nausea, but some bloating  Objective: Vital signs in last 24 hours: Temp:  [97.6 F (36.4 C)-99.8 F (37.7 C)] 98.6 F (37 C) (07/29 0736) Pulse Rate:  [67-97] 92 (07/29 0552) Resp:  [12-30] 23 (07/29 0552) BP: (107-154)/(55-79) 110/62 mmHg (07/29 0552) SpO2:  [74 %-99 %] 94 % (07/29 0552) FiO2 (%):  [35 %-50 %] 35 % (07/29 0430) Weight:  [203 lb 14.8 oz (92.5 kg)] 203 lb 14.8 oz (92.5 kg) (07/29 0430)    Intake/Output from previous day: 07/28 0701 - 07/29 0700 In: 735 [I.V.:660; IV Piggyback:75] Out: 1245 [Urine:1125; Drains:120] Intake/Output this shift: Total I/O In: -  Out: 165 [Urine:150; Drains:15]  PE: Abd: soft, appropriately tender, few distant BS, drain just emptied but with some bloody residual, ecchymosis (moderate size) around LLQ incision.  Lab Results:   Recent Labs  08/16/12 0535 08/17/12 0400  WBC 12.7* 10.7*  HGB 11.6* 11.8*  HCT 35.5* 35.9*  PLT 107* 109*   BMET  Recent Labs  08/16/12 0535 08/17/12 0400  NA 137 137  K 3.7 3.8  CL 102 100  CO2 29 25  GLUCOSE 144* 130*  BUN 18 21  CREATININE 1.69* 1.92*  CALCIUM 8.4 8.4   PT/INR  Recent Labs  08/15/12 0955 08/16/12 0535  LABPROT 20.3* 18.9*  INR 1.79* 1.63*   CMP     Component Value Date/Time   NA 137 08/17/2012 0400   K 3.8 08/17/2012 0400   CL 100 08/17/2012 0400   CO2 25 08/17/2012 0400   GLUCOSE 130* 08/17/2012 0400   BUN 21 08/17/2012 0400   CREATININE 1.92* 08/17/2012 0400   CALCIUM 8.4 08/17/2012 0400   PROT 6.8 08/16/2012 0535   ALBUMIN 3.3* 08/16/2012 0535   AST 15 08/16/2012 0535   ALT 12 08/16/2012 0535   ALKPHOS 47 08/16/2012 0535   BILITOT 1.0 08/16/2012 0535   GFRNONAA 31* 08/17/2012 0400   GFRAA 36* 08/17/2012 0400   Lipase     Component Value Date/Time    LIPASE 36 08/14/2012 2035       Studies/Results: Ct Abdomen Pelvis Wo Contrast  08/16/2012   *RADIOLOGY REPORT*  Clinical Data: Nausea and periumbilical pain.  Abdominal pain. Fever.  Possible colitis.  CT ABDOMEN AND PELVIS WITHOUT CONTRAST  Technique:  Multidetector CT imaging of the abdomen and pelvis was performed following the standard protocol without intravenous contrast.  Comparison: CT of the abdomen and pelvis 08/15/2012.  Findings:  Lung Bases: Biventricular pacemaker device in place with leads terminating in the right ventricular apex, right atrium and overlying the lateral wall of the left ventricle via the coronary sinus and coronary veins.  Dependent atelectasis or scarring in the lower lobes of the lungs bilaterally (mild).  Abdomen/Pelvis:  High attenuation material layers dependently within the gallbladder, presumably vicarious excretion of iodinated contrast related to CT scan performed 08/15/2012.  No findings to suggest acute cholecystitis at this time.  The unenhanced appearance of the liver, spleen and bilateral adrenal glands is unremarkable.  There is extensive perinephric stranding bilaterally, which is a nonspecific finding that can be seen in the setting of renal insufficiency.  This is similar to the prior study.  A small exophytic low attenuation right renal lesion measuring 1.2 cm in diameter  in the interpolar region is similar to the recent prior examination, likely to represent a small cyst. Other tiny left-sided renal lesions are not readily apparent on today's noncontrast CT examination (see report from 08/15/2012).  2 cm low attenuation lesion in the body of the pancreas is similar in size to the prior study.  The remainder the pancreas is otherwise unremarkable in appearance.  Extensive atherosclerosis throughout the abdominal and pelvic vasculature, including fusiform infrarenal abdominal aortic ectasia measuring up to 2.8 cm in diameter.  The appendix is now clearly  dilated measuring 14-15 mm in diameter. Despite the presence of a large amount of oral contrast material within the cecum, none of the appendix fills with contrast, compatible with appendiceal obstruction.  Extensive periappendiceal inflammatory changes are now more evident than the prior study, compatible with acute appendicitis.  No definite periappendiceal fluid collection is noted at this time to suggest abscess or frank perforation.  No pneumoperitoneum.  No pathologic distension of small bowel.  No definite pathologic lymphadenopathy identified within the abdomen or pelvis on today's noncontrast CT examination. Suspected colonic wall thickening on the prior study is not evident on today's examination where the colon is more normally distended with contrast material.  Prostate gland and urinary bladder are unremarkable in appearance.  Musculoskeletal: There are no aggressive appearing lytic or blastic lesions noted in the visualized portions of the skeleton.  IMPRESSION: 1.  Today's examination now demonstrates clear evidence of acute appendicitis.  Surgical consultation is strongly recommended. 2.  Additional incidental findings, similar to the prior examination 08/15/2012, as detailed above.  Critical Value/emergent results were called by telephone at the time of interpretation on 08/16/2012 at 02:05 p.m. to Dr. Donette Larry, who verbally acknowledged these results.   Original Report Authenticated By: Trudie Reed, M.D.   Dg Chest Port 1 View  08/16/2012   *RADIOLOGY REPORT*  Clinical Data: Feeding.  PORTABLE CHEST - 1 VIEW  Comparison: 08/14/2012 chest radiograph.  Findings: Lung volumes are lower than on prior.  There is pulmonary vascular congestion and fullness of the right hilum.  Basilar atelectasis.  There is no airspace disease/edema.  Three lead right subclavian pacemaker is present with coronary sinus lead.  No focal airspace consolidation.  No pneumothorax.  Cardiopericardial silhouette appears  within normal limits allowing for lower volumes of inspiration. Monitoring leads are projected over the chest.  IMPRESSION: Lower lung volumes with pulmonary vascular congestion and basilar atelectasis.   Original Report Authenticated By: Andreas Newport, M.D.    Anti-infectives: Anti-infectives   Start     Dose/Rate Route Frequency Ordered Stop   08/15/12 0337  piperacillin-tazobactam (ZOSYN) IVPB 3.375 g     3.375 g 12.5 mL/hr over 240 Minutes Intravenous 3 times per day 08/15/12 0337         Assessment/Plan  1. S/p lap appy for perforated appendix Patient Active Problem List   Diagnosis Date Noted  . Abdominal pain 08/15/2012  . Preoperative cardiovascular examination 08/15/2012  . Preoperative respiratory examination 08/15/2012  . Biventricular implantable cardiac defibrillator- [employer] 08/14/2010  . 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 HYPERLIPIDEMIA 11/19/2009  . C V A / STROKE 11/19/2009  . EMPHYSEMA 11/19/2009  . ASTHMA 11/19/2009  . CHRONIC RESPIRATORY FAILURE 11/19/2009  . nonischemic cardiomyopathy  06/07/2008  . SYSTOLIC HEART FAILURE, CHRONIC 06/07/2008  . COPD 06/07/2008   Plan: 1. Clear liquids today.  Patient hasn't had any yet, so would not advance past clears today 2. Begin mobilization, will order PT/OT 3.  Cont to hold coumadin for now.  May be ok to start tomorrow, but we will need to evaluate this drain and his abdomen as he has some bloody output this morning and a decent sized ecchymosis already around his LLQ incision. hgb stable though 4. pulm toilet 5. Cont zosyn 6. Other medical problems per primary service.   LOS: 3 days    Foye Haggart E 08/17/2012, 8:08 AM Pager: [phone]

## 2012-08-17 NOTE — Progress Notes (Signed)
Subjective: Pt fell better. No N/V  Objective: Vital signs in last 24 hours: Temp:  [97.6 F (36.4 C)-99.8 F (37.7 C)] 99.8 F (37.7 C) (07/29 0346) Pulse Rate:  [67-97] 92 (07/29 0552) Resp:  [12-30] 23 (07/29 0552) BP: (107-154)/(55-79) 110/62 mmHg (07/29 0552) SpO2:  [74 %-99 %] 94 % (07/29 0552) FiO2 (%):  [35 %-50 %] 35 % (07/29 0430) Weight:  [92.5 kg (203 lb 14.8 oz)] 92.5 kg (203 lb 14.8 oz) (07/29 0430) Weight change: 2.3 kg (5 lb 1.1 oz)    Intake/Output from previous day: 07/28 0701 - 07/29 0700 In: 672.5 [I.V.:610; IV Piggyback:62.5] Out: 1245 [Urine:1125; Drains:120] Intake/Output this shift: Total I/O In: 122.5 [I.V.:60; IV Piggyback:62.5] Out: 945 [Urine:825; Drains:120]  General appearance: alert Resp: diminished breath sounds bibasilar Cardio: regular rate and rhythm GI: soft, tender, right drain with small bloody drainage, quit,  no BS  Lab Results:  Recent Labs  08/16/12 0535 08/17/12 0400  WBC 12.7* 10.7*  HGB 11.6* 11.8*  HCT 35.5* 35.9*  PLT 107* 109*   BMET  Recent Labs  08/16/12 0535 08/17/12 0400  NA 137 137  K 3.7 3.8  CL 102 100  CO2 29 25  GLUCOSE 144* 130*  BUN 18 21  CREATININE 1.69* 1.92*  CALCIUM 8.4 8.4    Studies/Results: Ct Abdomen Pelvis Wo Contrast  08/16/2012   *RADIOLOGY REPORT*  Clinical Data: Nausea and periumbilical pain.  Abdominal pain. Fever.  Possible colitis.  CT ABDOMEN AND PELVIS WITHOUT CONTRAST  Technique:  Multidetector CT imaging of the abdomen and pelvis was performed following the standard protocol without intravenous contrast.  Comparison: CT of the abdomen and pelvis 08/15/2012.  Findings:  Lung Bases: Biventricular pacemaker device in place with leads terminating in the right ventricular apex, right atrium and overlying the lateral wall of the left ventricle via the coronary sinus and coronary veins.  Dependent atelectasis or scarring in the lower lobes of the lungs bilaterally (mild).   Abdomen/Pelvis:  High attenuation material layers dependently within the gallbladder, presumably vicarious excretion of iodinated contrast related to CT scan performed 08/15/2012.  No findings to suggest acute cholecystitis at this time.  The unenhanced appearance of the liver, spleen and bilateral adrenal glands is unremarkable.  There is extensive perinephric stranding bilaterally, which is a nonspecific finding that can be seen in the setting of renal insufficiency.  This is similar to the prior study.  A small exophytic low attenuation right renal lesion measuring 1.2 cm in diameter in the interpolar region is similar to the recent prior examination, likely to represent a small cyst. Other tiny left-sided renal lesions are not readily apparent on today's noncontrast CT examination (see report from 08/15/2012).  2 cm low attenuation lesion in the body of the pancreas is similar in size to the prior study.  The remainder the pancreas is otherwise unremarkable in appearance.  Extensive atherosclerosis throughout the abdominal and pelvic vasculature, including fusiform infrarenal abdominal aortic ectasia measuring up to 2.8 cm in diameter.  The appendix is now clearly dilated measuring 14-15 mm in diameter. Despite the presence of a large amount of oral contrast material within the cecum, none of the appendix fills with contrast, compatible with appendiceal obstruction.  Extensive periappendiceal inflammatory changes are now more evident than the prior study, compatible with acute appendicitis.  No definite periappendiceal fluid collection is noted at this time to suggest abscess or frank perforation.  No pneumoperitoneum.  No pathologic distension of small bowel.  No  definite pathologic lymphadenopathy identified within the abdomen or pelvis on today's noncontrast CT examination. Suspected colonic wall thickening on the prior study is not evident on today's examination where the colon is more normally distended  with contrast material.  Prostate gland and urinary bladder are unremarkable in appearance.  Musculoskeletal: There are no aggressive appearing lytic or blastic lesions noted in the visualized portions of the skeleton.  IMPRESSION: 1.  Today's examination now demonstrates clear evidence of acute appendicitis.  Surgical consultation is strongly recommended. 2.  Additional incidental findings, similar to the prior examination 08/15/2012, as detailed above.  Critical Value/emergent results were called by telephone at the time of interpretation on 08/16/2012 at 02:05 p.m. to Dr. Donette Larry, who verbally acknowledged these results.   Original Report Authenticated By: Trudie Reed, M.D.   Dg Chest Port 1 View  08/16/2012   *RADIOLOGY REPORT*  Clinical Data: Feeding.  PORTABLE CHEST - 1 VIEW  Comparison: 08/14/2012 chest radiograph.  Findings: Lung volumes are lower than on prior.  There is pulmonary vascular congestion and fullness of the right hilum.  Basilar atelectasis.  There is no airspace disease/edema.  Three lead right subclavian pacemaker is present with coronary sinus lead.  No focal airspace consolidation.  No pneumothorax.  Cardiopericardial silhouette appears within normal limits allowing for lower volumes of inspiration. Monitoring leads are projected over the chest.  IMPRESSION: Lower lung volumes with pulmonary vascular congestion and basilar atelectasis.   Original Report Authenticated By: Andreas Newport, M.D.    Medications: I have reviewed the patient's current medications.  Assessment/Plan: Abdominal pain-s/p Appendectomy- perforated Appendix- continue ABX-   Surgery f/u- continue clears today- ABX  IVF for 12 hrs COPD-continue on spiriva/ alb neb prn - CXR  Atelectasis  use spirometry at bedside and possible OOB- Chair CAD/ CHF/ AICD/ clinically stable-restart coumadin tomorrow - if ok with surgery;  tomorrow- without bridge- will start on his home dose HTN- BP low side- hold ACE and BB   DM- SS insulin- po med on hold- BS - ok  Ambulate today  CKD stage 3 cr stable baseline .Cr - at base 2.0   LOS: 3 days   Gabor Lusk 08/17/2012, 6:13 AM

## 2012-08-17 NOTE — Progress Notes (Signed)
Hematoma LLQ port site.  Borderline U/O. Getting up with PT. Patient examined and I agree with the assessment and plan  Violeta Gelinas, MD, MPH, FACS Pager: [phone]  08/17/2012 4:25 PM

## 2012-08-17 NOTE — Op Note (Signed)
NAME:  Robert Davies, DUPUY NO.:  [id number]  MEDICAL RECORD NO.:  [id number]  LOCATION:  3305                         FACILITY:  [facility]  PHYSICIAN:  Gabrielle Dare. Janee Morn, M.D.DATE OF BIRTH:  1930-06-29  DATE OF PROCEDURE:  08/16/2012 DATE OF DISCHARGE:                              OPERATIVE REPORT   PREOPERATIVE DIAGNOSIS:  Appendicitis.  POSTOPERATIVE DIAGNOSIS:  Perforated appendicitis.  PROCEDURE:  Laparoscopic appendectomy.  SURGEON:  Gabrielle Dare. Janee Morn, M.D.  ASSISTANT:  Faith Rogue, RNFA  ANESTHESIA:  General endotracheal.  HISTORY OF PRESENT ILLNESS:  The patient was admitted with abdominal pain 3 days ago.  His initial CT scan was equivocal.  He only marginally improved on bowel rest and IV antibiotics.  Followup CT scan was done today, which demonstrated acute appendicitis.  He was taken for emergent appendectomy.  PROCEDURE IN DETAIL:  Informed consent was obtained.  The patient received intravenous antibiotics.  He was brought to the operating room. The decision was made to leave his AICD functional and avoid using the Bovie.  This was in discussion with Anesthesiology in light of his cardiac history and echocardiogram today.  General endotracheal anesthesia was administered by the anesthesia staff.  Foley catheter was placed by nursing.  Abdomen was prepped and draped in sterile fashion. Time-out procedure was done.  Infraumbilical region was infiltrated with local anesthetic.  Infraumbilical incision was made.  Subcutaneous tissues were dissected down revealing the anterior fascia.  This was divided along the midline.  Peritoneal cavity was entered under direct vision.  0 Vicryl pursestring suture was placed around the fascial opening.  Hasson trocar was inserted into the abdomen.  The abdomen was insufflated with carbon dioxide in standard fashion.  Under direct vision, a 12-mm left lower quadrant and a 5-mm right upper quadrant port were  placed.  Local was used at each port site.  Laparoscopic exploration revealed a very inflamed and perforated appendicitis extending into the pelvis.  The appendix was quite short.  The appendix was quite friable as well.  So, the decision was made to divide the base first.  The base was intact.  The base was dissected out and then we mobilized some lateral cecal attachments using the Harmonic.  The base was divided with the Endo-GIA with a vascular load.  There was excellent staple line closure.  Remainder of the appendix was then bluntly dissected out, that was necrotic and friable, that was placed in an EndoCatch bag for pathology.  The area in the pelvis where the appendix was taken down was copiously irrigated.  Some further debris and a couple of fecaliths were removed from the area.  It was irrigated with two L of saline.  There was no bleeding.  Stump was rechecked and was intact along the cecum.  There was good hemostasis.  A 19-French Blake drain was placed down into the pelvis via the right lower quadrant and brought out via the right upper quadrant port site.  This was secured with nylon suture.  Pneumoperitoneum was released.  Ports were removed. The infraumbilical fascia was closed by tying the 0 Vicryl pursestring suture.  A figure-of-eight Vicryl suture was placed in the subcutaneous tissues  of the left lower quadrant port site for hemostasis.  Next, the skin of each was closed with running 4-0 Vicryl subcuticular stitch followed by Dermabond.  All counts were correct. The patient tolerated the procedure well without apparent complication and was taken to the recovery room in stable condition with plan for continued postop monitoring in Step-Down Unit.     Gabrielle Dare Janee Morn, M.D.     BET/MEDQ  D:  08/16/2012  T:  08/17/2012  Job:  [id number]

## 2012-08-17 NOTE — Evaluation (Signed)
Physical Therapy Evaluation Patient Details Name: Robert Davies MRN: [id number] DOB: [birth year] Today's Date: 08/17/2012 Time: 4098-1191 PT Time Calculation (min): 32 min  PT Assessment / Plan / Recommendation History of Present Illness  Robert Davies is a 79 y.o. male history of cardiomyopathy status post AICD placement, COPD and home oxygen, diabetes mellitus, hyperlipidemia, hypertension and on Coumadin probably for A. fib presents with complaints of abdominal pain. Patient started developed epigastric pain  after dinner. CT abdomen and pelvis shows features concerning for early appendicitis and colitis. Surgeon on call Dr. Magnus Ivan was consulted and at this time has advised admission.  pt sceduled for Lap appy.  Clinical Impression  Pt admitted with abdominal pain s/p lap. Appy.   Pt currently with functional limitations due to the deficits listed below (see PT Problem List).  Pt will benefit from skilled PT to increase their independence and safety with mobility to allow discharge to the venue listed below.      PT Assessment  Patient needs continued PT services    Follow Up Recommendations  SNF;Other (comment) (unless makes enough improvements to be Independent)    Does the patient have the potential to tolerate intense rehabilitation      Barriers to Discharge Decreased caregiver support      Equipment Recommendations       Recommendations for Other Services     Frequency Min 3X/week    Precautions / Restrictions Precautions Precautions: Fall Restrictions Weight Bearing Restrictions: No   Pertinent Vitals/Pain sats on arrival  82% with [place] pulled down out of his nose.  Return to 97% on 4L [place].  With gait,  sensor did not read well.      Mobility  Bed Mobility Bed Mobility: Supine to Sit;Sitting - Scoot to Edge of Bed Supine to Sit: 4: Min assist;With rails;HOB elevated;Other (comment) (up via L elbow) Sitting - Scoot to Edge of Bed: 4: Min guard Details for Bed  Mobility Assistance: cuing to keep pt on task Transfers Transfers: Sit to Stand;Stand to Sit Sit to Stand: 4: Min assist Stand to Sit: 4: Min assist Details for Transfer Assistance: vc's for hand placement; steady assist Ambulation/Gait Ambulation/Gait Assistance: 4: Min assist Ambulation Distance (Feet): 60 Feet Assistive device: Rolling walker Ambulation/Gait Assistance Details: guarded, mildly unsteady gait with RW Gait Pattern: Step-through pattern Gait velocity: slowed Stairs: No Wheelchair Mobility Wheelchair Mobility: No    Exercises     PT Diagnosis: Difficulty walking;Generalized weakness  PT Problem List: Decreased strength;Decreased activity tolerance;Decreased balance;Decreased mobility;Decreased knowledge of use of DME;Decreased knowledge of precautions;Pain PT Treatment Interventions: DME instruction;Gait training;Functional mobility training;Therapeutic activities;Balance training;Patient/family education     PT Goals(Current goals can be found in the care plan section) Acute Rehab PT Goals Patient Stated Goal: pt did not state--too lethargic PT Goal Formulation: Patient unable to participate in goal setting Time For Goal Achievement: 08/24/12 Potential to Achieve Goals: Good  Visit Information  Last PT Received On: 08/17/12 Assistance Needed: +1 (+2 for line helpful) History of Present Illness: Robert Davies is a 79 y.o. male history of cardiomyopathy status post AICD placement, COPD and home oxygen, diabetes mellitus, hyperlipidemia, hypertension and on Coumadin probably for A. fib presents with complaints of abdominal pain. Patient started developed epigastric pain  after dinner. CT abdomen and pelvis shows features concerning for early appendicitis and colitis. Surgeon on call Dr. Magnus Ivan was consulted and at this time has advised admission.  pt sceduled for Lap appy.  Prior Functioning  Home Living Family/patient expects to be discharged to::  Private residence (but may need [facility] for rehab) Living Arrangements: Alone Available Help at Discharge: Other (Comment) (can't come up with anyone who can help him after D/C) Type of Home: Apartment Home Access: Level entry Home Layout: One level Home Equipment: Walker - 2 wheels Additional Comments: Does very little walking, does not get out much Prior Function Level of Independence: Independent Communication Communication: No difficulties    Cognition  Cognition Arousal/Alertness: Lethargic Behavior During Therapy: WFL for tasks assessed/performed Overall Cognitive Status: No family/caregiver present to determine baseline cognitive functioning    Extremity/Trunk Assessment Upper Extremity Assessment Upper Extremity Assessment: Overall WFL for tasks assessed Lower Extremity Assessment Lower Extremity Assessment: Overall WFL for tasks assessed;Generalized weakness   Balance Balance Balance Assessed: Yes Static Sitting Balance Static Sitting - Balance Support: No upper extremity supported;Feet supported Static Sitting - Level of Assistance: 5: Stand by assistance Static Standing Balance Static Standing - Balance Support: Bilateral upper extremity supported;During functional activity Static Standing - Level of Assistance: 4: Min assist  End of Session PT - End of Session Equipment Utilized During Treatment: Oxygen Activity Tolerance: Patient tolerated treatment well;Patient limited by lethargy Patient left: in chair;with call bell/phone within reach Nurse Communication: Mobility status  GP     Keonia Pasko, Eliseo Gum 08/17/2012, 4:59 PM 08/17/2012  [place], PT [phone]  (pager)

## 2012-08-18 ENCOUNTER — Encounter (HOSPITAL_COMMUNITY): Payer: Self-pay | Admitting: General Practice

## 2012-08-18 ENCOUNTER — Inpatient Hospital Stay (HOSPITAL_COMMUNITY): Payer: Medicare Other

## 2012-08-18 LAB — GLUCOSE, CAPILLARY
Glucose-Capillary: 135 mg/dL — ABNORMAL HIGH (ref 70–99)
Glucose-Capillary: 135 mg/dL — ABNORMAL HIGH (ref 70–99)

## 2012-08-18 LAB — BASIC METABOLIC PANEL
BUN: 27 mg/dL — ABNORMAL HIGH (ref 6–23)
CO2: 27 mEq/L (ref 19–32)
Calcium: 8.5 mg/dL (ref 8.4–10.5)
Creatinine, Ser: 2.12 mg/dL — ABNORMAL HIGH (ref 0.50–1.35)
Glucose, Bld: 132 mg/dL — ABNORMAL HIGH (ref 70–99)

## 2012-08-18 LAB — CBC
MCH: 28.8 pg (ref 26.0–34.0)
MCHC: 33.7 g/dL (ref 30.0–36.0)
MCV: 85.5 fL (ref 78.0–100.0)
Platelets: 87 10*3/uL — ABNORMAL LOW (ref 150–400)
RDW: 16.2 % — ABNORMAL HIGH (ref 11.5–15.5)
WBC: 11.9 10*3/uL — ABNORMAL HIGH (ref 4.0–10.5)

## 2012-08-18 MED ORDER — ALBUTEROL SULFATE (5 MG/ML) 0.5% IN NEBU
2.5000 mg | INHALATION_SOLUTION | Freq: Three times a day (TID) | RESPIRATORY_TRACT | Status: DC
  Administered 2012-08-18 – 2012-08-24 (×20): 2.5 mg via RESPIRATORY_TRACT
  Filled 2012-08-18 (×19): qty 0.5

## 2012-08-18 MED ORDER — SODIUM CHLORIDE 0.45 % IV SOLN
INTRAVENOUS | Status: DC
  Administered 2012-08-18: 75 mL/h via INTRAVENOUS
  Administered 2012-08-19: 11:00:00 via INTRAVENOUS

## 2012-08-18 NOTE — Progress Notes (Signed)
2 Days Post-Op  Subjective: Only a little flatus, denies nausea  Objective: Vital signs in last 24 hours: Temp:  [97.7 F (36.5 C)-98.3 F (36.8 C)] 98.3 F (36.8 C) (07/30 0406) Pulse Rate:  [73-88] 88 (07/30 0406) Resp:  [17-23] 19 (07/30 0406) BP: (104-112)/(65-71) 105/68 mmHg (07/30 0406) SpO2:  [92 %-96 %] 95 % (07/30 0406) Weight:  [93.4 kg (205 lb 14.6 oz)] 93.4 kg (205 lb 14.6 oz) (07/30 0406)    Intake/Output from previous day: 07/29 0701 - 07/30 0700 In: 687.5 [I.V.:587.5; IV Piggyback:75] Out: 770 [Urine:650; Drains:120] Intake/Output this shift: Total I/O In: 12.5 [IV Piggyback:12.5] Out: -   General appearance: cooperative Resp: clear to auscultation bilaterally Cardio: paced GI: soft but distended, quiet, NT, LLQ port site ecchymosis soft but a little larger  Lab Results:   Recent Labs  08/17/12 0400 08/18/12 0445  WBC 10.7* 11.9*  HGB 11.8* 12.7*  HCT 35.9* 37.7*  PLT 109* 87*   BMET  Recent Labs  08/17/12 0400 08/18/12 0445  NA 137 135  K 3.8 4.1  CL 100 98  CO2 25 27  GLUCOSE 130* 132*  BUN 21 27*  CREATININE 1.92* 2.12*  CALCIUM 8.4 8.5   PT/INR  Recent Labs  08/15/12 0955 08/16/12 0535  LABPROT 20.3* 18.9*  INR 1.79* 1.63*   ABG No results found for this basename: PHART, PCO2, PO2, HCO3,  in the last 72 hours  Studies/Results: Ct Abdomen Pelvis Wo Contrast  08/16/2012   *RADIOLOGY REPORT*  Clinical Data: Nausea and periumbilical pain.  Abdominal pain. Fever.  Possible colitis.  CT ABDOMEN AND PELVIS WITHOUT CONTRAST  Technique:  Multidetector CT imaging of the abdomen and pelvis was performed following the standard protocol without intravenous contrast.  Comparison: CT of the abdomen and pelvis 08/15/2012.  Findings:  Lung Bases: Biventricular pacemaker device in place with leads terminating in the right ventricular apex, right atrium and overlying the lateral wall of the left ventricle via the coronary sinus and coronary  veins.  Dependent atelectasis or scarring in the lower lobes of the lungs bilaterally (mild).  Abdomen/Pelvis:  High attenuation material layers dependently within the gallbladder, presumably vicarious excretion of iodinated contrast related to CT scan performed 08/15/2012.  No findings to suggest acute cholecystitis at this time.  The unenhanced appearance of the liver, spleen and bilateral adrenal glands is unremarkable.  There is extensive perinephric stranding bilaterally, which is a nonspecific finding that can be seen in the setting of renal insufficiency.  This is similar to the prior study.  A small exophytic low attenuation right renal lesion measuring 1.2 cm in diameter in the interpolar region is similar to the recent prior examination, likely to represent a small cyst. Other tiny left-sided renal lesions are not readily apparent on today's noncontrast CT examination (see report from 08/15/2012).  2 cm low attenuation lesion in the body of the pancreas is similar in size to the prior study.  The remainder the pancreas is otherwise unremarkable in appearance.  Extensive atherosclerosis throughout the abdominal and pelvic vasculature, including fusiform infrarenal abdominal aortic ectasia measuring up to 2.8 cm in diameter.  The appendix is now clearly dilated measuring 14-15 mm in diameter. Despite the presence of a large amount of oral contrast material within the cecum, none of the appendix fills with contrast, compatible with appendiceal obstruction.  Extensive periappendiceal inflammatory changes are now more evident than the prior study, compatible with acute appendicitis.  No definite periappendiceal fluid collection is noted  at this time to suggest abscess or frank perforation.  No pneumoperitoneum.  No pathologic distension of small bowel.  No definite pathologic lymphadenopathy identified within the abdomen or pelvis on today's noncontrast CT examination. Suspected colonic wall thickening on the  prior study is not evident on today's examination where the colon is more normally distended with contrast material.  Prostate gland and urinary bladder are unremarkable in appearance.  Musculoskeletal: There are no aggressive appearing lytic or blastic lesions noted in the visualized portions of the skeleton.  IMPRESSION: 1.  Today's examination now demonstrates clear evidence of acute appendicitis.  Surgical consultation is strongly recommended. 2.  Additional incidental findings, similar to the prior examination 08/15/2012, as detailed above.  Critical Value/emergent results were called by telephone at the time of interpretation on 08/16/2012 at 02:05 p.m. to Dr. Donette Larry, who verbally acknowledged these results.   Original Report Authenticated By: Trudie Reed, M.D.   Dg Chest Port 1 View  08/16/2012   *RADIOLOGY REPORT*  Clinical Data: Feeding.  PORTABLE CHEST - 1 VIEW  Comparison: 08/14/2012 chest radiograph.  Findings: Lung volumes are lower than on prior.  There is pulmonary vascular congestion and fullness of the right hilum.  Basilar atelectasis.  There is no airspace disease/edema.  Three lead right subclavian pacemaker is present with coronary sinus lead.  No focal airspace consolidation.  No pneumothorax.  Cardiopericardial silhouette appears within normal limits allowing for lower volumes of inspiration. Monitoring leads are projected over the chest.  IMPRESSION: Lower lung volumes with pulmonary vascular congestion and basilar atelectasis.   Original Report Authenticated By: Andreas Newport, M.D.    Anti-infectives: Anti-infectives   Start     Dose/Rate Route Frequency Ordered Stop   08/15/12 0337  piperacillin-tazobactam (ZOSYN) IVPB 3.375 g     3.375 g 12.5 mL/hr over 240 Minutes Intravenous 3 times per day 08/15/12 4540        Assessment/Plan: s/p Procedure(s): APPENDECTOMY LAPAROSCOPIC (N/A) S/P lap appy for perforated appendicitis POD#2 - ileus, bowel rest and IVF ID - continue  drain, on Zosyn Acute on chronic renal insufficiency - fluids per primary  LOS: 4 days    Hutch Rhett E 08/18/2012

## 2012-08-18 NOTE — Progress Notes (Signed)
Have notified [facility] for Dr. Eula Listen to look at the patient. Renata Caprice can and looked at patient's abdomen and JP ddrainage. PA at this time is feeling comfortable with the abdominal assessment.

## 2012-08-18 NOTE — Evaluation (Signed)
Occupational Therapy Evaluation Patient Details Name: GEN CLAGG MRN: [id number] DOB: 02-21-1930 Today's Date: 08/18/2012 Time: 4098-1191 OT Time Calculation (min): 17 min  OT Assessment / Plan / Recommendation History of present illness BENTON TOOKER is a 79 y.o. male history of cardiomyopathy status post AICD placement, COPD and home oxygen, diabetes mellitus, hyperlipidemia, hypertension and on Coumadin probably for A. fib presents with complaints of abdominal pain. Patient started developed epigastric pain  after dinner. CT abdomen and pelvis shows features concerning for early appendicitis and colitis. Surgeon on call Dr. Magnus Ivan was consulted and at this time has advised admission.  pt sceduled for Lap appy.   Clinical Impression   Pt admitted with above.  Pt demonstrating decreased activity tolerance and requires significant assist with ADLs and functional mobility. Recommending SNF for d/c planning to maximize independence and safety before eventual return home.     OT Assessment  Patient needs continued OT Services    Follow Up Recommendations  SNF    Barriers to Discharge Decreased caregiver support    Equipment Recommendations  3 in 1 bedside comode    Recommendations for Other Services    Frequency  Min 2X/week    Precautions / Restrictions Precautions Precautions: Fall   Pertinent Vitals/Pain SpO2 decreased to 79% on 4L with activity and varies 87-91% on 3L at rest. RN aware    ADL  Upper Body Bathing: Simulated;Supervision/safety Where Assessed - Upper Body Bathing: Unsupported sitting Lower Body Bathing: Simulated;Maximal assistance Where Assessed - Lower Body Bathing: Unsupported sitting Upper Body Dressing: Performed;Minimal assistance Where Assessed - Upper Body Dressing: Unsupported sitting Lower Body Dressing: Performed;+1 Total assistance Where Assessed - Lower Body Dressing: Unsupported sitting Toilet Transfer: Simulated;+2 Total  assistance Toilet Transfer: Patient Percentage: 60% Toilet Transfer Method: Sit to Barista:  (chair) Equipment Used: Rolling walker;Gait belt (oxygen) Transfers/Ambulation Related to ADLs: +2 total assist for steadying and safety. R knee buckling x3 and required assist to regain balance. ADL Comments: SpO2 descreases with activity (desat to 79% on 4L during ambulation).     OT Diagnosis: Generalized weakness;Acute pain  OT Problem List: Decreased strength;Decreased activity tolerance;Impaired balance (sitting and/or standing);Decreased safety awareness;Decreased knowledge of use of DME or AE;Cardiopulmonary status limiting activity;Obesity;Pain OT Treatment Interventions: Self-care/ADL training;DME and/or AE instruction;Therapeutic activities;Patient/family education;Balance training   OT Goals(Current goals can be found in the care plan section) Acute Rehab OT Goals Patient Stated Goal: to return home on his own OT Goal Formulation: With patient Time For Goal Achievement: 09/01/12 Potential to Achieve Goals: Good  Visit Information  Last OT Received On: 08/18/12 Assistance Needed: +2 PT/OT Co-Evaluation/Treatment: Yes History of Present Illness: JAKHAI FANT is a 79 y.o. male history of cardiomyopathy status post AICD placement, COPD and home oxygen, diabetes mellitus, hyperlipidemia, hypertension and on Coumadin probably for A. fib presents with complaints of abdominal pain. Patient started developed epigastric pain  after dinner. CT abdomen and pelvis shows features concerning for early appendicitis and colitis. Surgeon on call Dr. Magnus Ivan was consulted and at this time has advised admission.  pt sceduled for Lap appy.       Prior Functioning     Home Living Family/patient expects to be discharged to:: Private residence Living Arrangements: Alone Available Help at Discharge: Family;Available PRN/intermittently Type of Home: Apartment Home Access:  Level entry Home Layout: One level Home Equipment: Walker - 2 wheels Additional Comments: Pt has a daughter but states he does not want to rely on people for  help. Does very little walking, does not get out much Prior Function Level of Independence: Independent Communication Communication: No difficulties         Vision/Perception     Cognition  Cognition Arousal/Alertness: Awake/alert Behavior During Therapy: Flat affect Overall Cognitive Status: No family/caregiver present to determine baseline cognitive functioning    Extremity/Trunk Assessment Upper Extremity Assessment Upper Extremity Assessment: Overall WFL for tasks assessed     Mobility Bed Mobility Bed Mobility: Supine to Sit;Sitting - Scoot to Edge of Bed Supine to Sit: 3: Mod assist;HOB elevated;With rails Sitting - Scoot to Edge of Bed: 4: Min guard Transfers Transfers: Sit to Stand;Stand to Sit Sit to Stand: 4: Min assist;From bed;With upper extremity assist Stand to Sit: 3: Mod assist;To chair/3-in-1;With upper extremity assist;With armrests Details for Transfer Assistance: VCs for safe hand placement.     Exercise     Balance Balance Balance Assessed: Yes Static Sitting Balance Static Sitting - Balance Support: No upper extremity supported;Feet supported Static Sitting - Level of Assistance: 5: Stand by assistance   End of Session OT - End of Session Equipment Utilized During Treatment: Gait belt;Oxygen Activity Tolerance: Patient limited by fatigue Patient left: in chair;with call bell/phone within reach Nurse Communication: Mobility status  GO    08/18/2012 Cipriano Mile OTR/L Pager [phone] Office [phone]  Cipriano Mile 08/18/2012, 12:39 PM

## 2012-08-18 NOTE — Progress Notes (Signed)
Subjective: C/o nausea Some pain  Objective: Vital signs in last 24 hours: Temp:  [97.7 F (36.5 C)-98.6 F (37 C)] 98.3 F (36.8 C) (07/30 0406) Pulse Rate:  [73-88] 88 (07/30 0406) Resp:  [17-23] 19 (07/30 0406) BP: (104-112)/(65-71) 105/68 mmHg (07/30 0406) SpO2:  [92 %-96 %] 95 % (07/30 0406) Weight:  [93.4 kg (205 lb 14.6 oz)] 93.4 kg (205 lb 14.6 oz) (07/30 0406) Weight change: 0.9 kg (1 lb 15.7 oz)    Intake/Output from previous day: 07/29 0701 - 07/30 0700 In: 675 [I.V.:587.5; IV Piggyback:62.5] Out: 700 [Urine:650; Drains:50] Intake/Output this shift:    General appearance: alert Resp: diminished breath sounds bibasilar and wheezes bilaterally Cardio: regular rate and rhythm GI: soft, tenderness, no BS  Lab Results:  Recent Labs  08/17/12 0400 08/18/12 0445  WBC 10.7* 11.9*  HGB 11.8* 12.7*  HCT 35.9* 37.7*  PLT 109* 87*   BMET  Recent Labs  08/17/12 0400 08/18/12 0445  NA 137 135  K 3.8 4.1  CL 100 98  CO2 25 27  GLUCOSE 130* 132*  BUN 21 27*  CREATININE 1.92* 2.12*  CALCIUM 8.4 8.5    Studies/Results: Ct Abdomen Pelvis Wo Contrast  08/16/2012   *RADIOLOGY REPORT*  Clinical Data: Nausea and periumbilical pain.  Abdominal pain. Fever.  Possible colitis.  CT ABDOMEN AND PELVIS WITHOUT CONTRAST  Technique:  Multidetector CT imaging of the abdomen and pelvis was performed following the standard protocol without intravenous contrast.  Comparison: CT of the abdomen and pelvis 08/15/2012.  Findings:  Lung Bases: Biventricular pacemaker device in place with leads terminating in the right ventricular apex, right atrium and overlying the lateral wall of the left ventricle via the coronary sinus and coronary veins.  Dependent atelectasis or scarring in the lower lobes of the lungs bilaterally (mild).  Abdomen/Pelvis:  High attenuation material layers dependently within the gallbladder, presumably vicarious excretion of iodinated contrast related to CT scan  performed 08/15/2012.  No findings to suggest acute cholecystitis at this time.  The unenhanced appearance of the liver, spleen and bilateral adrenal glands is unremarkable.  There is extensive perinephric stranding bilaterally, which is a nonspecific finding that can be seen in the setting of renal insufficiency.  This is similar to the prior study.  A small exophytic low attenuation right renal lesion measuring 1.2 cm in diameter in the interpolar region is similar to the recent prior examination, likely to represent a small cyst. Other tiny left-sided renal lesions are not readily apparent on today's noncontrast CT examination (see report from 08/15/2012).  2 cm low attenuation lesion in the body of the pancreas is similar in size to the prior study.  The remainder the pancreas is otherwise unremarkable in appearance.  Extensive atherosclerosis throughout the abdominal and pelvic vasculature, including fusiform infrarenal abdominal aortic ectasia measuring up to 2.8 cm in diameter.  The appendix is now clearly dilated measuring 14-15 mm in diameter. Despite the presence of a large amount of oral contrast material within the cecum, none of the appendix fills with contrast, compatible with appendiceal obstruction.  Extensive periappendiceal inflammatory changes are now more evident than the prior study, compatible with acute appendicitis.  No definite periappendiceal fluid collection is noted at this time to suggest abscess or frank perforation.  No pneumoperitoneum.  No pathologic distension of small bowel.  No definite pathologic lymphadenopathy identified within the abdomen or pelvis on today's noncontrast CT examination. Suspected colonic wall thickening on the prior study is not evident  on today's examination where the colon is more normally distended with contrast material.  Prostate gland and urinary bladder are unremarkable in appearance.  Musculoskeletal: There are no aggressive appearing lytic or blastic  lesions noted in the visualized portions of the skeleton.  IMPRESSION: 1.  Today's examination now demonstrates clear evidence of acute appendicitis.  Surgical consultation is strongly recommended. 2.  Additional incidental findings, similar to the prior examination 08/15/2012, as detailed above.  Critical Value/emergent results were called by telephone at the time of interpretation on 08/16/2012 at 02:05 p.m. to Dr. Donette Larry, who verbally acknowledged these results.   Original Report Authenticated By: Trudie Reed, M.D.   Dg Chest Port 1 View  08/16/2012   *RADIOLOGY REPORT*  Clinical Data: Feeding.  PORTABLE CHEST - 1 VIEW  Comparison: 08/14/2012 chest radiograph.  Findings: Lung volumes are lower than on prior.  There is pulmonary vascular congestion and fullness of the right hilum.  Basilar atelectasis.  There is no airspace disease/edema.  Three lead right subclavian pacemaker is present with coronary sinus lead.  No focal airspace consolidation.  No pneumothorax.  Cardiopericardial silhouette appears within normal limits allowing for lower volumes of inspiration. Monitoring leads are projected over the chest.  IMPRESSION: Lower lung volumes with pulmonary vascular congestion and basilar atelectasis.   Original Report Authenticated By: Andreas Newport, M.D.    Medications: I have reviewed the patient's current medications.  Assessment/Plan: Abdominal pain-s/p Appendectomy- perforated Appendix- continue ABX- c/o Nausea- on clear liquid Passed some flatus yerterday- ? Ileus- check KUB- NPO for now Surgery f/u  IVF COPD-continue on spiriva/ alb neb prn - CXR Atelectasis use spirometry at bedside and possible OOB- Chair  CAD/ CHF/ AICD/ clinically stable-hold coumadin - still some bloody drain- hematoma same size Low platlets- recheck PLatlets HTN- BP low side- hold ACE and BB  DM- SS insulin- po med on hold- BS - ok  Keep foley for now- accurate I/O Ambulate today /PT  CKD stage 3 cr stable  baseline .Cr - at base 2.0   LOS: 4 days   Telma Pyeatt 08/18/2012, 7:26 AM

## 2012-08-18 NOTE — Progress Notes (Signed)
ANTIBIOTIC CONSULT NOTE - FOLLOW UP  Pharmacy Consult:  Zosyn Indication: Empiric for acute appendicitis  Allergies  Allergen Reactions  . Adhesive (Tape)     Break out    Patient Measurements: Height: 5\' 7"  (170.2 cm) Weight: 205 lb 14.6 oz (93.4 kg) IBW/kg (Calculated) : 66.1  Vital Signs: Temp: 98 F (36.7 C) (07/30 1212) Temp src: Oral (07/30 1212) BP: 121/72 mmHg (07/30 1212) Pulse Rate: 86 (07/30 1200) Intake/Output from previous day: 07/29 0701 - 07/30 0700 In: 687.5 [I.V.:587.5; IV Piggyback:75] Out: 770 [Urine:650; Drains:120] Intake/Output from this shift: Total I/O In: 12.5 [IV Piggyback:12.5] Out: 275 [Urine:200; Drains:75]  Labs:  Recent Labs  08/16/12 0535 08/17/12 0400 08/18/12 0445  WBC 12.7* 10.7* 11.9*  HGB 11.6* 11.8* 12.7*  PLT 107* 109* 87*  CREATININE 1.69* 1.92* 2.12*   Estimated Creatinine Clearance: 30.3 ml/min (by C-G formula based on Cr of 2.12). No results found for this basename: VANCOTROUGH, Leodis Binet, VANCORANDOM, GENTTROUGH, GENTPEAK, GENTRANDOM, TOBRATROUGH, TOBRAPEAK, TOBRARND, AMIKACINPEAK, AMIKACINTROU, AMIKACIN,  in the last 72 hours   Microbiology: Recent Results (from the past 720 hour(s))  MRSA PCR SCREENING     Status: None   Collection Time    08/15/12  5:38 AM      Result Value Range Status   MRSA by PCR NEGATIVE  NEGATIVE Final   Comment:            The GeneXpert MRSA Assay (FDA     approved for NASAL specimens     only), is one component of a     comprehensive MRSA colonization     surveillance program. It is not     intended to diagnose MRSA     infection nor to guide or     monitor treatment for     MRSA infections.       Assessment: 69 YOM admitted with complaints of abdominal pain, found to have acute appendicitis and is now s/p appendectomy.  Patient to continue on Zosyn as empiric therapy.  He has CKD and his renal function is worsening, likely from contrast.   Goal of Therapy:  Infection  prevention / clearance of infection   Plan:  - Continue Zosyn 3.375 gm IV Q8H, 4 hr infusion - Monitor renal function and adjust as needed - F/U resume Coumadin when appropriate - Monitor plts     Karlynn Furrow D. Laney Potash, PharmD, BCPS Pager:  [phone] 08/18/2012, 2:27 PM

## 2012-08-18 NOTE — Progress Notes (Signed)
Physical Therapy Treatment Patient Details Name: Robert Davies MRN: [id number] DOB: 1930-06-29 Today's Date: 08/18/2012 Time: 7829-5621 PT Time Calculation (min): 17 min  PT Assessment / Plan / Recommendation  History of Present Illness Robert Davies is a 79 y.o. male history of cardiomyopathy status post AICD placement, COPD and home oxygen, diabetes mellitus, hyperlipidemia, hypertension and on Coumadin probably for A. fib presents with complaints of abdominal pain. Patient started developed epigastric pain  after dinner. CT abdomen and pelvis shows features concerning for early appendicitis and colitis. Surgeon on call Dr. Magnus Ivan was consulted and at this time has advised admission.  pt sceduled for Lap appy.   PT Comments   Pt admitted with appendicitis. Pt currently with functional limitations due to poor endurance and poor safety as well as L knee buckling.   Pt will benefit from skilled PT to increase their independence and safety with mobility to allow discharge to the venue listed below.   Follow Up Recommendations  SNF;Supervision/Assistance - 24 hour                 Equipment Recommendations  None recommended by PT        Frequency Min 3X/week   Progress towards PT Goals Progress towards PT goals: Progressing toward goals  Plan Current plan remains appropriate    Precautions / Restrictions Precautions Precautions: Fall Restrictions Weight Bearing Restrictions: No   Pertinent Vitals/Pain VSS, no pain    Mobility  Bed Mobility Bed Mobility: Supine to Sit;Sitting - Scoot to Edge of Bed Supine to Sit: 3: Mod assist;HOB elevated;With rails Sitting - Scoot to Edge of Bed: 4: Min guard Details for Bed Mobility Assistance: cuing to keep pt on task Transfers Transfers: Sit to Stand;Stand to Sit Sit to Stand: 4: Min assist;From bed;With upper extremity assist Stand to Sit: 3: Mod assist;To chair/3-in-1;With upper extremity assist;With armrests Details for Transfer  Assistance: VCs for safe hand placement. Ambulation/Gait Ambulation/Gait Assistance: 1: +2 Total assist Ambulation/Gait: Patient Percentage: 60% Ambulation Distance (Feet): 75 Feet Assistive device: Rolling walker Ambulation/Gait Assistance Details: Pt with several episodes of left knee buckling.  Needed assist to recover balance several times during gait.  Cues for safety with RW as well for sequencing steps and RW.  Continues with unsteady gait at times.   Gait Pattern: Step-to pattern;Decreased stride length;Trunk flexed Gait velocity: slowed Stairs: No Wheelchair Mobility Wheelchair Mobility: No        PT Goals (current goals can now be found in the care plan section) Acute Rehab PT Goals Patient Stated Goal: to return home on his own  Visit Information  Last PT Received On: 08/18/12 Assistance Needed: +2 PT/OT Co-Evaluation/Treatment: Yes History of Present Illness: Robert Davies is a 79 y.o. male history of cardiomyopathy status post AICD placement, COPD and home oxygen, diabetes mellitus, hyperlipidemia, hypertension and on Coumadin probably for A. fib presents with complaints of abdominal pain. Patient started developed epigastric pain  after dinner. CT abdomen and pelvis shows features concerning for early appendicitis and colitis. Surgeon on call Dr. Magnus Ivan was consulted and at this time has advised admission.  pt sceduled for Lap appy.    Subjective Data  Subjective: "I am tired." Patient Stated Goal: to return home on his own   Cognition  Cognition Arousal/Alertness: Awake/alert Behavior During Therapy: Flat affect Overall Cognitive Status: No family/caregiver present to determine baseline cognitive functioning    Balance  Balance Balance Assessed: Yes Static Sitting Balance Static Sitting - Balance Support: No  upper extremity supported;Feet supported Static Sitting - Level of Assistance: 5: Stand by assistance Static Sitting - Comment/# of Minutes: 2 min -  leaned posteriorly initially upon sitting  Static Standing Balance Static Standing - Balance Support: Bilateral upper extremity supported;During functional activity Static Standing - Level of Assistance: 3: Mod assist Static Standing - Comment/# of Minutes: leans posteriorly initially upon standing.  2 min  End of Session PT - End of Session Equipment Utilized During Treatment: Gait belt;Oxygen Activity Tolerance: Patient tolerated treatment well;Patient limited by lethargy Patient left: in chair;with call bell/phone within reach Nurse Communication: Mobility status        INGOLD,Jeneva Schweizer 08/18/2012, 1:10 PM [facility] Acute Rehabilitation [phone] (pager)

## 2012-08-19 LAB — GLUCOSE, CAPILLARY
Glucose-Capillary: 134 mg/dL — ABNORMAL HIGH (ref 70–99)
Glucose-Capillary: 110 mg/dL — ABNORMAL HIGH (ref 70–99)
Glucose-Capillary: 125 mg/dL — ABNORMAL HIGH (ref 70–99)
Glucose-Capillary: 195 mg/dL — ABNORMAL HIGH (ref 70–99)

## 2012-08-19 LAB — BASIC METABOLIC PANEL
BUN: 28 mg/dL — ABNORMAL HIGH (ref 6–23)
CO2: 27 mEq/L (ref 19–32)
Calcium: 8.2 mg/dL — ABNORMAL LOW (ref 8.4–10.5)
Creatinine, Ser: 1.9 mg/dL — ABNORMAL HIGH (ref 0.50–1.35)
GFR calc non Af Amer: 32 mL/min — ABNORMAL LOW (ref >90)
Glucose, Bld: 131 mg/dL — ABNORMAL HIGH (ref 70–99)

## 2012-08-19 LAB — CBC
HCT: 31 % — ABNORMAL LOW (ref 39.0–52.0)
Hemoglobin: 10.3 g/dL — ABNORMAL LOW (ref 13.0–17.0)
MCH: 28.1 pg (ref 26.0–34.0)
MCHC: 33.2 g/dL (ref 30.0–36.0)
MCV: 84.5 fL (ref 78.0–100.0)
RBC: 3.67 MIL/uL — ABNORMAL LOW (ref 4.22–5.81)

## 2012-08-19 MED ORDER — BOOST / RESOURCE BREEZE PO LIQD
1.0000 | Freq: Three times a day (TID) | ORAL | Status: DC
  Administered 2012-08-19 – 2012-08-24 (×6): 1 via ORAL

## 2012-08-19 NOTE — Progress Notes (Signed)
3 Days Post-Op  Subjective: Up in chair, some flatus, feels better  Objective: Vital signs in last 24 hours: Temp:  [97.6 F (36.4 C)-99.6 F (37.6 C)] 97.6 F (36.4 C) (07/31 0800) Pulse Rate:  [85-88] 88 (07/31 0000) Resp:  [19-26] 26 (07/31 0339) BP: (111-124)/(66-72) 123/66 mmHg (07/31 0800) SpO2:  [92 %-94 %] 94 % (07/31 0000) Weight:  [94.1 kg (207 lb 7.3 oz)] 94.1 kg (207 lb 7.3 oz) (07/31 0339)    Intake/Output from previous day: 07/30 0701 - 07/31 0700 In: 1635 [I.V.:1622.5; IV Piggyback:12.5] Out: 1045 [Urine:750; Drains:295] Intake/Output this shift: Total I/O In: 30 [Other:30] Out: 150 [Urine:150]  General appearance: cooperative Resp: clear to auscultation bilaterally Cardio: regular rate and rhythm GI: soft, ecchymosis/hematoma LLQ port site, +BS, not significantly tender, drain serosang  Lab Results:   Recent Labs  08/18/12 0445 08/19/12 0424  WBC 11.9* 8.9  HGB 12.7* 10.3*  HCT 37.7* 31.0*  PLT 87* 137*   BMET  Recent Labs  08/18/12 0445 08/19/12 0424  NA 135 136  K 4.1 3.7  CL 98 102  CO2 27 27  GLUCOSE 132* 131*  BUN 27* 28*  CREATININE 2.12* 1.90*  CALCIUM 8.5 8.2*   PT/INR No results found for this basename: LABPROT, INR,  in the last 72 hours ABG No results found for this basename: PHART, PCO2, PO2, HCO3,  in the last 72 hours  Studies/Results: Dg Abd 1 View  08/18/2012   *RADIOLOGY REPORT*  Clinical Data: Post appendectomy, abdominal pain and distension, nausea, evaluate for ileus  ABDOMEN - 1 VIEW  Comparison: CT abdomen and pelvis - 08/16/2012  Findings: The previously ingested enteric contrast remains within the colon.  There is mild gaseous distension of several featureless loops of small bowel with index loop in the left mid hemiabdomen measuring approximately 3.3 cm in diameter.  The descending colon also appears slightly featureless, possibly secondary to under distension.  Nondiagnostic evaluation for pneumoperitoneum  secondary supine position exclusion the lower thorax.  A surgical drain overlies the right lower abdominal quadrant. There is a minimal amount of excreted contrast within the urinary bladder.  No acute osseous abnormalities.  IMPRESSION: Findings most suggestive of mild postoperative ileus though developing small bowel obstruction may have a similar appearance. Radiographic follow up may be performed as clinically indicated.   Original Report Authenticated By: Tacey Ruiz, MD    Anti-infectives: Anti-infectives   Start     Dose/Rate Route Frequency Ordered Stop   08/15/12 0337  piperacillin-tazobactam (ZOSYN) IVPB 3.375 g     3.375 g 12.5 mL/hr over 240 Minutes Intravenous 3 times per day 08/15/12 1610        Assessment/Plan: s/p Procedure(s): APPENDECTOMY LAPAROSCOPIC (N/A) S/P lap appy for perforated appendicitis POD#3 - ileus improving, clears ID - continue drain, on Zosyn Acute on chronic renal insufficiency - near baseline per primary  LOS: 5 days    Corliss Coggeshall E 08/19/2012

## 2012-08-19 NOTE — Progress Notes (Signed)
Called answering service. Pg Dr. Valentina Lucks re: decreased bowel sounds, increased SOA, no appetite, increased distention. Awaiting return response.   1905 Dr. Valentina Lucks. Possible ileus worsening. Place NPO. Monitor.

## 2012-08-19 NOTE — Progress Notes (Signed)
Subjective: Feel better this  Am Less pain. No Nausea   Objective: Vital signs in last 24 hours: Temp:  [97 F (36.1 C)-99.6 F (37.6 C)] 98.5 F (36.9 C) (07/31 0339) Pulse Rate:  [85-91] 88 (07/31 0000) Resp:  [19-26] 26 (07/31 0339) BP: (111-124)/(66-77) 124/70 mmHg (07/31 0339) SpO2:  [92 %-94 %] 94 % (07/31 0000) Weight:  [94.1 kg (207 lb 7.3 oz)] 94.1 kg (207 lb 7.3 oz) (07/31 0339) Weight change: 0.7 kg (1 lb 8.7 oz)    Intake/Output from previous day: 07/30 0701 - 07/31 0700 In: 1635 [I.V.:1622.5; IV Piggyback:12.5] Out: 1045 [Urine:750; Drains:295] Intake/Output this shift:    General appearance: alert Resp: diminished breath sounds bilaterally Cardio: regular rate and rhythm GI: soft BS present- hematonma - ok  Lab Results:  Recent Labs  08/18/12 0445 08/19/12 0424  WBC 11.9* 8.9  HGB 12.7* 10.3*  HCT 37.7* 31.0*  PLT 87* 137*   BMET  Recent Labs  08/18/12 0445 08/19/12 0424  NA 135 136  K 4.1 3.7  CL 98 102  CO2 27 27  GLUCOSE 132* 131*  BUN 27* 28*  CREATININE 2.12* 1.90*  CALCIUM 8.5 8.2*    Studies/Results: Dg Abd 1 View  08/18/2012   *RADIOLOGY REPORT*  Clinical Data: Post appendectomy, abdominal pain and distension, nausea, evaluate for ileus  ABDOMEN - 1 VIEW  Comparison: CT abdomen and pelvis - 08/16/2012  Findings: The previously ingested enteric contrast remains within the colon.  There is mild gaseous distension of several featureless loops of small bowel with index loop in the left mid hemiabdomen measuring approximately 3.3 cm in diameter.  The descending colon also appears slightly featureless, possibly secondary to under distension.  Nondiagnostic evaluation for pneumoperitoneum secondary supine position exclusion the lower thorax.  A surgical drain overlies the right lower abdominal quadrant. There is a minimal amount of excreted contrast within the urinary bladder.  No acute osseous abnormalities.  IMPRESSION: Findings most  suggestive of mild postoperative ileus though developing small bowel obstruction may have a similar appearance. Radiographic follow up may be performed as clinically indicated.   Original Report Authenticated By: Tacey Ruiz, MD    Medications: I have reviewed the patient's current medications.  Assessment/Plan: Abdominal pain-s/p Appendectomy- perforated Appendix- continue ABX-mild post op ileus- clinically better- start on clears Surgery f/u  IVF  COPD-continue on spiriva/ alb neb prn - CXR Atelectasis use spirometry at bedside and possible OOB- Chair  CAD/ CHF/ AICD/ clinically stable-hold coumadin ? Ok to resume if ok with surgery- in 1-2 days - still some bloody drain- hematoma same size  Low platlets- better Anemia-  BPH- with some obstuction- continue flomax- foley for one more day  HTN- BP- ok  hold ACE and BB  DM- SS insulin- po med on hold- BS - ok  Keep foley for now- accurate I/O  Ambulate today /PT  CKD stage 3 cr stable baseline .Cr - at base 2.0   LOS: 5 days   Robert Davies 08/19/2012, 7:47 AM

## 2012-08-19 NOTE — Progress Notes (Signed)
Clinical Social Work Department CLINICAL SOCIAL WORK PLACEMENT NOTE 08/19/2012  Patient:  Robert Davies, Robert Davies  Account Number:  [id number] Admit date:  08/14/2012  Clinical Social Worker:  Leron Croak, CLINICAL SOCIAL WORKER  Date/time:  08/19/2012 10:48 AM  Clinical Social Work is seeking post-discharge placement for this patient at the following level of care:   SKILLED NURSING   (*CSW will update this form in Epic as items are completed)   08/18/2012  Patient/family provided with [facility] Department of Clinical Social Work's list of facilities offering this level of care within the geographic area requested by the patient (or if unable, by the patient's family).  08/18/2012  Patient/family informed of their freedom to choose among providers that offer the needed level of care, that participate in Medicare, Medicaid or managed care program needed by the patient, have an available bed and are willing to accept the patient.  08/18/2012  Patient/family informed of [facility]' ownership interest in [facility], as well as of the fact that they are under no obligation to receive care at this facility.  PASARR submitted to EDS on 08/19/2012 PASARR number received from EDS on 08/19/2012  FL2 transmitted to all facilities in geographic area requested by pt/family on  08/19/2012 FL2 transmitted to all facilities within larger geographic area on 08/19/2012  Patient informed that his/her managed care company has contracts with or will negotiate with  certain facilities, including the following:     Patient/family informed of bed offers received:   Patient chooses bed at  Physician recommends and patient chooses bed at    Patient to be transferred to  on   Patient to be transferred to facility by   The following physician request were entered in Epic:   Additional Comments:   Leron Croak, [facility]  [phone]

## 2012-08-19 NOTE — Progress Notes (Signed)
Clinical Social Work Department BRIEF PSYCHOSOCIAL ASSESSMENT 08/19/2012  Patient:  Robert Davies, Robert Davies     Account Number:  [id number]     Admit date:  08/14/2012  Clinical Social Worker:  Leron Croak, CLINICAL SOCIAL WORKER  Date/Time:  08/19/2012 07:46 AM  Referred by:  Physician  Date Referred:  08/18/2012 Referred for  SNF Placement   Other Referral:   Interview type:  Patient Other interview type:   Pt's daughter was also at the bedside.  Robert Davies [phone].    PSYCHOSOCIAL DATA Living Status:  ALONE Admitted from facility:   Level of care:   Primary support name:  Robert Davies [phone] Primary support relationship to patient:  CHILD, ADULT Degree of support available:   Pt has support through family    CURRENT CONCERNS Current Concerns  Post-Acute Placement   Other Concerns:    SOCIAL WORK ASSESSMENT / PLAN CSW met with the Pt and Dtr at the bedside to discuss possible need for SNF and recommendation from PT that placement would be beneficial. Pt originally was not agreeable with SNF, however after much discussion with Dtr and CSW agreed for CSW to fax out FL2 information to both [place] and [place]. Pt's first choice would be [facility] and second would be [facility].   Assessment/plan status:  Information/Referral to [facility] Other assessment/ plan:   Information/referral to community resources:   Pt and daughter was given information for facilities in the above counties for placement    PATIENT'S/FAMILY'S RESPONSE TO PLAN OF CARE: Pt and family were appreciative for assistance with SNF placement and support.       Leron Croak, LCSWA [facility].  [phone]

## 2012-08-19 NOTE — Progress Notes (Signed)
INITIAL NUTRITION ASSESSMENT  DOCUMENTATION CODES Per approved criteria  -Not Applicable   INTERVENTION:  Resource Breeze PO TID, each supplement provides 250 kcal and 9 grams of protein.  Diet advancement as tolerated per MD discretion. Recommend Heart Healthy diet.  NUTRITION DIAGNOSIS: Inadequate oral intake related to altered GI function as evidenced by clear liquid diet.   Goal: Intake to meet >90% of estimated nutrition needs.  Monitor:  Diet advancement, PO intake, labs, weight trend.  Reason for Assessment: MST=3  79 y.o. male  Admitting Dx: Abdominal pain  ASSESSMENT: Patient admitted with abdominal pain; CT on admission concerning for early appendicitis and colitis. S/P laparoscopic appendectomy for perforated appendicitis on 7/28. Patient remains on clear liquids post-op. Intake has been minimal per discussion with RN. Abbreviated physical exam completed.  No muscle or subcutaneous fat depletion noticed. No weight loss PTA.  Height: Ht Readings from Last 1 Encounters:  08/15/12 5\' 7"  (1.702 m)    Weight: Wt Readings from Last 1 Encounters:  08/19/12 207 lb 7.3 oz (94.1 kg)    Ideal Body Weight: 67.3 kg  % Ideal Body Weight: 140%  Wt Readings from Last 10 Encounters:  08/19/12 207 lb 7.3 oz (94.1 kg)  08/19/12 207 lb 7.3 oz (94.1 kg)  09/10/11 187 lb 1.9 oz (84.877 kg)  08/14/11 208 lb (94.348 kg)  06/06/11 211 lb (95.709 kg)  05/13/11 214 lb (97.07 kg)  03/17/11 210 lb (95.255 kg)  11/27/10 205 lb (92.987 kg)  11/27/10 205 lb (92.987 kg)  11/20/10 204 lb (92.534 kg)    Usual Body Weight: 208-210 lb  % Usual Body Weight: 100%  BMI:  Body mass index is 32.48 kg/(m^2). class 1 obesity  Estimated Nutritional Needs: Kcal: 1800-2000 Protein: 90-100 gm Fluid: 1.8-2 L  Skin: abdominal incision  Diet Order: Clear Liquid  EDUCATION NEEDS: -Education not appropriate at this time   Intake/Output Summary (Last 24 hours) at 08/19/12 1502 Last  data filed at 08/19/12 1158  Gross per 24 hour  Intake 1622.5 ml  Output   1150 ml  Net  472.5 ml    Last BM: none documented   Labs:   Recent Labs Lab 08/17/12 0400 08/18/12 0445 08/19/12 0424  NA 137 135 136  K 3.8 4.1 3.7  CL 100 98 102  CO2 25 27 27   BUN 21 27* 28*  CREATININE 1.92* 2.12* 1.90*  CALCIUM 8.4 8.5 8.2*  GLUCOSE 130* 132* 131*    CBG (last 3)   Recent Labs  08/19/12 0342 08/19/12 0813 08/19/12 1156  GLUCAP 134* 110* 125*    Scheduled Meds: . albuterol  2.5 mg Nebulization TID  . budesonide (PULMICORT) nebulizer solution  0.25 mg Nebulization BID  . docusate sodium  200 mg Oral BID  . dorzolamide-timolol  1 drop Both Eyes BID  . fluticasone  2 spray Each Nare Daily  . insulin aspart  0-9 Units Subcutaneous Q4H  . metoprolol  2.5 mg Intravenous Q6H  . pantoprazole  40 mg Oral Daily  . piperacillin-tazobactam (ZOSYN)  IV  3.375 g Intravenous Q8H  . polyethylene glycol  17 g Oral Daily  . sodium chloride  3 mL Intravenous Q12H  . tamsulosin  0.4 mg Oral QPC supper  . tiotropium  18 mcg Inhalation Daily    Continuous Infusions: . sodium chloride 50 mL/hr at 08/19/12 1111    Past Medical History  Diagnosis Date  . Emphysema     Oxygen-dependent  . Chronic systolic heart failure   .  Implantable cardiac defibrillator infection     July 2012  . ICD (implantable cardiac defibrillator) in place     CRT St Jude; remote - yes  . Nonischemic cardiomyopathy     a. Catheterization 2005 no obstructive coronary disease;  b. 02/2009 Echo: EF 35-40%, Gr 2 DD, mild lvh.  Marland Kitchen HLD (hyperlipidemia)   . Essential hypertension, benign   . Myocardial infarction 1990's  . Peptic ulcer   . GI bleeding   . BPH (benign prostatic hypertrophy)   . DM (diabetes mellitus)   . Coronary atherosclerosis of native coronary artery     Nonobstructive  . Shortness of breath   . Automatic implantable cardioverter-defibrillator in situ   . CHF (congestive heart  failure)   . Chronic kidney disease     Past Surgical History  Procedure Laterality Date  . Adenoidectomy    . Doppler echocardiography  2005, 2011  . Cardiac defibrillator placement  11/27/10    "this is my 3rd defibrillator"  . Cervical disc surgery  1990's    "slipped"  . Laceration repair  ~ 1939    right leg  . Tonsillectomy  ~ 1940  . Insert / replace / remove pacemaker  11/27/10    "this is my third one"  . Eye surgery    . Cataract extraction      "left eye"  . Laparoscopic appendectomy N/A 08/16/2012    Procedure: APPENDECTOMY LAPAROSCOPIC;  Surgeon: Liz Malady, MD;  Location: [facility] OR;  Service: General;  Laterality: N/A;  . Appendectomy  08/16/2012    Joaquin Courts, RD, LDN, CNSC Pager [phone] After Hours Pager [phone]

## 2012-08-19 NOTE — Progress Notes (Signed)
No urine output in last 4 hours. Pt has foley catheter and c/o mid abdomen pressure. Bladder scanner reads 209 cc. MD notified. New orders received will continue to monitor. Attempted to irrigate foley, pt c/o of pressure. Foley replaced with no complications. 225 cc of clear, yellow urine returned. Pt states he no longer feels pressure and that he feels better, will continue to monitor.

## 2012-08-20 ENCOUNTER — Inpatient Hospital Stay (HOSPITAL_COMMUNITY): Payer: Medicare Other

## 2012-08-20 LAB — GLUCOSE, CAPILLARY
Glucose-Capillary: 196 mg/dL — ABNORMAL HIGH (ref 70–99)
Glucose-Capillary: 149 mg/dL — ABNORMAL HIGH (ref 70–99)
Glucose-Capillary: 152 mg/dL — ABNORMAL HIGH (ref 70–99)

## 2012-08-20 LAB — CBC
HCT: 31.4 % — ABNORMAL LOW (ref 39.0–52.0)
MCV: 85.6 fL (ref 78.0–100.0)
Platelets: 151 10*3/uL (ref 150–400)
RBC: 3.67 MIL/uL — ABNORMAL LOW (ref 4.22–5.81)
RDW: 16.3 % — ABNORMAL HIGH (ref 11.5–15.5)
WBC: 6.5 10*3/uL (ref 4.0–10.5)

## 2012-08-20 MED ORDER — METOCLOPRAMIDE HCL 5 MG/ML IJ SOLN
5.0000 mg | Freq: Three times a day (TID) | INTRAMUSCULAR | Status: DC
  Administered 2012-08-20 – 2012-08-24 (×13): 5 mg via INTRAVENOUS
  Filled 2012-08-20 (×16): qty 1

## 2012-08-20 NOTE — Progress Notes (Signed)
Subjective: Overnight more nausea Distension of abdomen  Objective: Vital signs in last 24 hours: Temp:  [97.6 F (36.4 C)-99.5 F (37.5 C)] 99.5 F (37.5 C) (08/01 0400) Pulse Rate:  [73-85] 73 (08/01 0400) Resp:  [17-23] 21 (08/01 0400) BP: (123-145)/(66-75) 145/75 mmHg (08/01 0400) SpO2:  [93 %-98 %] 93 % (08/01 0400) Weight:  [95.1 kg (209 lb 10.5 oz)] 95.1 kg (209 lb 10.5 oz) (08/01 0400) Weight change: 1 kg (2 lb 3.3 oz)    Intake/Output from previous day: 07/31 0701 - 08/01 0700 In: 600 [I.V.:600] Out: 1165 [Urine:1025; Drains:140] Intake/Output this shift:    General appearance: alert Resp: diminished breath sounds bilaterally Cardio: regular rate and rhythm GI: soft, destended, quit, no BS  Lab Results:  Recent Labs  08/19/12 0424 08/20/12 0420  WBC 8.9 6.5  HGB 10.3* 10.2*  HCT 31.0* 31.4*  PLT 137* 151   BMET  Recent Labs  08/18/12 0445 08/19/12 0424  NA 135 136  K 4.1 3.7  CL 98 102  CO2 27 27  GLUCOSE 132* 131*  BUN 27* 28*  CREATININE 2.12* 1.90*  CALCIUM 8.5 8.2*    Studies/Results: Dg Abd 1 View  08/18/2012   *RADIOLOGY REPORT*  Clinical Data: Post appendectomy, abdominal pain and distension, nausea, evaluate for ileus  ABDOMEN - 1 VIEW  Comparison: CT abdomen and pelvis - 08/16/2012  Findings: The previously ingested enteric contrast remains within the colon.  There is mild gaseous distension of several featureless loops of small bowel with index loop in the left mid hemiabdomen measuring approximately 3.3 cm in diameter.  The descending colon also appears slightly featureless, possibly secondary to under distension.  Nondiagnostic evaluation for pneumoperitoneum secondary supine position exclusion the lower thorax.  A surgical drain overlies the right lower abdominal quadrant. There is a minimal amount of excreted contrast within the urinary bladder.  No acute osseous abnormalities.  IMPRESSION: Findings most suggestive of mild  postoperative ileus though developing small bowel obstruction may have a similar appearance. Radiographic follow up may be performed as clinically indicated.   Original Report Authenticated By: Tacey Ruiz, MD    Medications: I have reviewed the patient's current medications.  Assessment/Plan: Abdominal pain-s/p Appendectomy- perforated Appendix- continue ABX-some worse of ileus- check KUB, NPO- add Reglan IV for Short period to see if help-Surgery f/u  Still with bloody drainage- low grade fever; ? Need Re-imaging of abdomen  COPD-continue on spiriva/ alb neb prn - CXR Atelectasis use spirometry at bedside and possible OOB- Chair/ ambulate  CAD/ CHF/ AICD/ clinically stable-hold coumadin ? lovanox while waiting for coumadin - still some bloody drain- hematoma same size  Low platlets- better  Anemia- stable BPH- with some obstuction- continue flomax- foley for one more day  HTN- BP- ok hold ACE and BB  DM- SS insulin- po med on hold- BS - ok  Keep foley for now- accurate I/O  Ambulate today /PT  CKD stage 3 cr stable baseline .Cr - at base 2.0      LOS: 6 days   Robert Davies 08/20/2012, 7:25 AM

## 2012-08-20 NOTE — Progress Notes (Signed)
Paged  Dr. Janee Morn re: unable to get NG tube placed, two nurses attempted, sensitive gag reflex. OK to send to [facility] for placement?  Orders: OK for [facility]ro placement of NG.

## 2012-08-20 NOTE — Progress Notes (Signed)
4 Days Post-Op  Subjective: Some emesis overnight, did have BM X2  Objective: Vital signs in last 24 hours: Temp:  [98.2 F (36.8 C)-99.5 F (37.5 C)] 99.5 F (37.5 C) (08/01 0400) Pulse Rate:  [73-85] 73 (08/01 0400) Resp:  [17-23] 21 (08/01 0400) BP: (134-145)/(66-75) 145/75 mmHg (08/01 0400) SpO2:  [93 %-99 %] 99 % (08/01 0819) Weight:  [94.5 kg (208 lb 5.4 oz)-95.1 kg (209 lb 10.5 oz)] 94.5 kg (208 lb 5.4 oz) (08/01 0500)    Intake/Output from previous day: 07/31 0701 - 08/01 0700 In: 600 [I.V.:600] Out: 1165 [Urine:1025; Drains:140] Intake/Output this shift: Total I/O In: -  Out: 250 [Urine:250]  General appearance: cooperative Resp: clear to auscultation bilaterally Cardio: regular rate and rhythm GI: soft but distended, very few BS, drain SS, no sig tenderness, incisions OK Neurologic: Mental status: Alert, oriented, thought content appropriate  Lab Results:   Recent Labs  08/19/12 0424 08/20/12 0420  WBC 8.9 6.5  HGB 10.3* 10.2*  HCT 31.0* 31.4*  PLT 137* 151   BMET  Recent Labs  08/18/12 0445 08/19/12 0424  NA 135 136  K 4.1 3.7  CL 98 102  CO2 27 27  GLUCOSE 132* 131*  BUN 27* 28*  CREATININE 2.12* 1.90*  CALCIUM 8.5 8.2*   PT/INR No results found for this basename: LABPROT, INR,  in the last 72 hours ABG No results found for this basename: PHART, PCO2, PO2, HCO3,  in the last 72 hours  Studies/Results: Dg Abd 1 View  08/20/2012   *RADIOLOGY REPORT*  Clinical Data: Follow up ileus  ABDOMEN - 1 VIEW  Comparison: Prior radiograph 08/18/2012  Findings: Increasing gaseous distension of multiple loops of small bowel in the mid abdomen.  There has been some interval distal progression of previously ingested oral contrast material which now extends to the rectum.  Stable position of surgical drain overlying the right lower quadrant.  No large free air on this supine radiograph.  IMPRESSION: Progression of small bowel distension throughout the  midabdomen may represent  worsening ileus or developing obstruction.  Ileus is favored.  There has been some mild distal progression of previously ingested contrast material within the colon.   Original Report Authenticated By: Malachy Moan, M.D.   Dg Abd 1 View  08/18/2012   *RADIOLOGY REPORT*  Clinical Data: Post appendectomy, abdominal pain and distension, nausea, evaluate for ileus  ABDOMEN - 1 VIEW  Comparison: CT abdomen and pelvis - 08/16/2012  Findings: The previously ingested enteric contrast remains within the colon.  There is mild gaseous distension of several featureless loops of small bowel with index loop in the left mid hemiabdomen measuring approximately 3.3 cm in diameter.  The descending colon also appears slightly featureless, possibly secondary to under distension.  Nondiagnostic evaluation for pneumoperitoneum secondary supine position exclusion the lower thorax.  A surgical drain overlies the right lower abdominal quadrant. There is a minimal amount of excreted contrast within the urinary bladder.  No acute osseous abnormalities.  IMPRESSION: Findings most suggestive of mild postoperative ileus though developing small bowel obstruction may have a similar appearance. Radiographic follow up may be performed as clinically indicated.   Original Report Authenticated By: Tacey Ruiz, MD    Anti-infectives: Anti-infectives   Start     Dose/Rate Route Frequency Ordered Stop   08/15/12 0337  piperacillin-tazobactam (ZOSYN) IVPB 3.375 g     3.375 g 12.5 mL/hr over 240 Minutes Intravenous 3 times per day 08/15/12 9604  Assessment/Plan: s/p Procedure(s): APPENDECTOMY LAPAROSCOPIC (N/A) S/P lap appy for perforated appendicitis POD#4- ileus worse, will place NGT ID - continue drain, on Zosyn VTE - PLTs up so  Ok for SQ hep or Lovenox from our standpoint  LOS: 6 days    Bryella Diviney E 08/20/2012

## 2012-08-20 NOTE — Progress Notes (Signed)
Occupational Therapy Treatment Patient Details Name: Robert Davies MRN: [id number] DOB: 1930-03-27 Today's Date: 08/20/2012 Time: 1202-1224 OT Time Calculation (min): 22 min  OT Assessment / Plan / Recommendation  History of present illness Robert Davies is a 79 y.o. male history of cardiomyopathy status post AICD placement, COPD and home oxygen, diabetes mellitus, hyperlipidemia, hypertension and on Coumadin probably for A. fib presents with complaints of abdominal pain. Patient started developed epigastric pain  after dinner. CT abdomen and pelvis shows features concerning for early appendicitis and colitis. Surgeon on call Dr. Magnus Ivan was consulted and at this time has advised admission.  Robert Davies.   OT comments  Robert demonstrating increased activity tolerance today.  Will continue to follow acutely.  Follow Up Recommendations  SNF    Barriers to Discharge       Equipment Recommendations  3 in 1 bedside comode    Recommendations for Other Services    Frequency Min 2X/week   Progress towards OT Goals Progress towards OT goals: Progressing toward goals  Plan Discharge plan remains appropriate    Precautions / Restrictions Precautions Precautions: Fall Restrictions Weight Bearing Restrictions: No   Pertinent Vitals/Pain See vitals    ADL  Eating/Feeding: NPO Toilet Transfer: Simulated;+2 Total assistance Toilet Transfer: Patient Percentage: 70% Toilet Transfer Method: Sit to Barista:  (chair) Equipment Used: Rolling walker;Gait belt (oxygen) Transfers/Ambulation Related to ADLs: +2 total assist for steadying and close chair follow due to occasional knee buckling.   ADL Comments: very motivated to participate in therapy sesson. States he knows he can't go home like this.    OT Diagnosis:    OT Problem List:   OT Treatment Interventions:     OT Goals(current goals can now be found in the care plan section) Acute Rehab OT  Goals Patient Stated Goal: to return home on his own OT Goal Formulation: With patient Time For Goal Achievement: 09/01/12 Potential to Achieve Goals: Good ADL Goals Robert Will Perform Grooming: with modified independence;sitting;standing Robert Will Perform Lower Body Dressing: with supervision;sit to/from stand;with adaptive equipment Robert Will Transfer to Toilet: with supervision;ambulating;bedside commode Robert Will Perform Toileting - Clothing Manipulation and hygiene: with supervision;sit to/from stand  Visit Information  Last OT Received On: 08/20/12 Assistance Needed: +2 History of Present Illness: Robert Davies is a 78 y.o. male history of cardiomyopathy status post AICD placement, COPD and home oxygen, diabetes mellitus, hyperlipidemia, hypertension and on Coumadin probably for A. fib presents with complaints of abdominal pain. Patient started developed epigastric pain  after dinner. CT abdomen and pelvis shows features concerning for early appendicitis and colitis. Surgeon on call Dr. Magnus Ivan was consulted and at this time has advised admission.  Robert Davies.    Subjective Data      Prior Functioning       Cognition  Cognition Arousal/Alertness: Awake/alert Behavior During Therapy: WFL for tasks assessed/performed Overall Cognitive Status: No family/caregiver present to determine baseline cognitive functioning    Mobility  Bed Mobility Bed Mobility: Not assessed Transfers Transfers: Sit to Stand;Stand to Sit Sit to Stand: 4: Min assist;From chair/3-in-1;With armrests;With upper extremity assist Stand to Sit: 4: Min assist;To chair/3-in-1;With armrests;With upper extremity assist Details for Transfer Assistance: VCs for safe hand placement.    Exercises  General Exercises - Upper Extremity Shoulder Flexion: AROM;15 reps;Seated;Both Shoulder Extension: AROM;Both;15 reps;Seated Elbow Flexion: AROM;Both;20 reps;Seated Elbow Extension: AROM;Both;20 reps;Seated    Balance Balance Balance Assessed: Yes  Static  Standing Balance Static Standing - Balance Support: Bilateral upper extremity supported;During functional activity Static Standing - Level of Assistance: 4: Min assist Static Standing - Comment/# of Minutes: 2   End of Session OT - End of Session Equipment Utilized During Treatment: Gait belt;Oxygen Activity Tolerance: Patient tolerated treatment well Patient left: in chair;with call bell/phone within reach Nurse Communication: Mobility status  GO   08/20/2012 Cipriano Mile OTR/L Pager [phone] Office [phone]   Cipriano Mile 08/20/2012, 4:09 PM

## 2012-08-20 NOTE — Progress Notes (Signed)
Physical Therapy Treatment Patient Details Name: Robert Davies MRN: [id number] DOB: 11/09/1930 Today's Date: 08/20/2012 Time: 1203-1224 PT Time Calculation (min): 21 min  PT Assessment / Plan / Recommendation  History of Present Illness Robert Davies is a 79 y.o. male history of cardiomyopathy status post AICD placement, COPD and home oxygen, diabetes mellitus, hyperlipidemia, hypertension and on Coumadin probably for A. fib presents with complaints of abdominal pain. Patient started developed epigastric pain  after dinner. CT abdomen and pelvis shows features concerning for early appendicitis and colitis. Surgeon on call Dr. Magnus Ivan was consulted and at this time has advised admission.  pt sceduled for Lap appy.   PT Comments   Pt admitted with appendectomy. Pt currently with functional limitations due to poor endurance and left LE weakness.   Pt will benefit from skilled PT to increase their independence and safety with mobility to allow discharge to the venue listed below.   Follow Up Recommendations  SNF;Supervision/Assistance - 24 hour                 Equipment Recommendations  None recommended by PT        Frequency Min 3X/week   Progress towards PT Goals Progress towards PT goals: Progressing toward goals  Plan Current plan remains appropriate    Precautions / Restrictions Precautions Precautions: Fall Restrictions Weight Bearing Restrictions: No   Pertinent Vitals/Pain VSS, no pain    Mobility  Bed Mobility Bed Mobility: Not assessed Transfers Transfers: Sit to Stand;Stand to Sit Sit to Stand: 4: Min assist;From bed;With upper extremity assist Stand to Sit: To chair/3-in-1;With upper extremity assist;With armrests;4: Min assist Details for Transfer Assistance: VCs for safe hand placement. Ambulation/Gait Ambulation/Gait Assistance: 1: +2 Total assist Ambulation/Gait: Patient Percentage: 70% Ambulation Distance (Feet): 80 Feet Assistive device: Rolling  walker Ambulation/Gait Assistance Details: Pt continues with left knee buckling needing steadying assist at times. Cues for safety and sequencing with rW as well.  Gait Pattern: Step-to pattern;Decreased stride length;Trunk flexed Gait velocity: slowed Stairs: No Wheelchair Mobility Wheelchair Mobility: No     PT Goals (current goals can now be found in the care plan section)    Visit Information  Last PT Received On: 08/20/12 Assistance Needed: +2 PT/OT Co-Evaluation/Treatment: Yes History of Present Illness: Robert Davies is a 79 y.o. male history of cardiomyopathy status post AICD placement, COPD and home oxygen, diabetes mellitus, hyperlipidemia, hypertension and on Coumadin probably for A. fib presents with complaints of abdominal pain. Patient started developed epigastric pain  after dinner. CT abdomen and pelvis shows features concerning for early appendicitis and colitis. Surgeon on call Dr. Magnus Ivan was consulted and at this time has advised admission.  pt sceduled for Lap appy.    Subjective Data  Subjective: "I am so tired."   Cognition  Cognition Arousal/Alertness: Awake/alert Behavior During Therapy: Flat affect Overall Cognitive Status: No family/caregiver present to determine baseline cognitive functioning    Balance  Static Sitting Balance Static Sitting - Balance Support: Feet supported;Bilateral upper extremity supported Static Sitting - Level of Assistance: 5: Stand by assistance Static Sitting - Comment/# of Minutes: 2 Static Standing Balance Static Standing - Balance Support: Bilateral upper extremity supported;During functional activity Static Standing - Level of Assistance: 3: Mod assist Static Standing - Comment/# of Minutes: 2  End of Session PT - End of Session Equipment Utilized During Treatment: Gait belt;Oxygen Activity Tolerance: Patient tolerated treatment well;Patient limited by lethargy Patient left: in chair;with call bell/phone within  reach Nurse Communication:  Mobility status        Robert Davies 08/20/2012, 1:53 PM  [facility] [phone] (pager)

## 2012-08-20 NOTE — Progress Notes (Signed)
[facility] called. NG unsuccessful by flouro. Report in eMAR. Suggesting f/u neck CT.  Orders: No new orders, will address later.

## 2012-08-21 LAB — GLUCOSE, CAPILLARY
Glucose-Capillary: 114 mg/dL — ABNORMAL HIGH (ref 70–99)
Glucose-Capillary: 105 mg/dL — ABNORMAL HIGH (ref 70–99)
Glucose-Capillary: 190 mg/dL — ABNORMAL HIGH (ref 70–99)
Glucose-Capillary: 174 mg/dL — ABNORMAL HIGH (ref 70–99)

## 2012-08-21 LAB — BASIC METABOLIC PANEL
BUN: 19 mg/dL (ref 6–23)
Chloride: 103 mEq/L (ref 96–112)
Creatinine, Ser: 1.42 mg/dL — ABNORMAL HIGH (ref 0.50–1.35)
Glucose, Bld: 113 mg/dL — ABNORMAL HIGH (ref 70–99)
Potassium: 3.7 mEq/L (ref 3.5–5.1)

## 2012-08-21 MED ORDER — WARFARIN SODIUM 7.5 MG PO TABS
7.5000 mg | ORAL_TABLET | Freq: Once | ORAL | Status: AC
  Administered 2012-08-21: 7.5 mg via ORAL
  Filled 2012-08-21: qty 1

## 2012-08-21 MED ORDER — WARFARIN - PHARMACIST DOSING INPATIENT: Freq: Every day | Status: DC

## 2012-08-21 NOTE — Progress Notes (Signed)
Subjective: Patient pleasant, no distress, has had a bowel movement. Surgery note reviewed in regards to cleared to resume anticoagulation. NG tube was unable to be place, comment in regard to CT of the neck noted. When stable we'll consider CT of neck  Objective: Vital signs in last 24 hours: Temp:  [97.8 F (36.6 C)-98.5 F (36.9 C)] 98 F (36.7 C) (08/02 0700) Pulse Rate:  [60-66] 63 (08/02 0425) Resp:  [17-19] 17 (08/02 0425) BP: (128-145)/(64-73) 145/64 mmHg (08/02 0425) SpO2:  [97 %-100 %] 100 % (08/02 0857) Weight:  [92.6 kg (204 lb 2.3 oz)] 92.6 kg (204 lb 2.3 oz) (08/02 0500) Weight change: -2.5 kg (-5 lb 8.2 oz) Last BM Date: 08/20/12  Intake/Output from previous day: 08/01 0701 - 08/02 0700 In: 575 [I.V.:553] Out: 845 [Urine:800; Drains:45] Intake/Output this shift: Total I/O In: -  Out: 135 [Urine:100; Drains:35]  General appearance: alert and cooperative Resp: Clear to auscultation anteriorly Cardio: regular rate and rhythm GI: Abdomen distended, soft, positive bowel sounds some areas of ecchymosis and bruising which have been circled, there is no expansion pass the area of demarcation Extremities: extremities normal, atraumatic, no cyanosis or edema  Lab Results:  Results for orders placed during the hospital encounter of 08/14/12 (from the past 24 hour(s))  GLUCOSE, CAPILLARY     Status: Abnormal   Collection Time    08/20/12 11:38 AM      Result Value Range   Glucose-Capillary 152 (*) 70 - 99 mg/dL   Comment 1 Documented in Chart     Comment 2 Notify RN    GLUCOSE, CAPILLARY     Status: Abnormal   Collection Time    08/20/12  4:19 PM      Result Value Range   Glucose-Capillary 151 (*) 70 - 99 mg/dL   Comment 1 Notify RN     Comment 2 Documented in Chart    GLUCOSE, CAPILLARY     Status: Abnormal   Collection Time    08/20/12  7:41 PM      Result Value Range   Glucose-Capillary 141 (*) 70 - 99 mg/dL  GLUCOSE, CAPILLARY     Status: Abnormal   Collection Time    08/21/12 12:13 AM      Result Value Range   Glucose-Capillary 109 (*) 70 - 99 mg/dL   Comment 1 Documented in Chart     Comment 2 Notify RN    BASIC METABOLIC PANEL     Status: Abnormal   Collection Time    08/21/12  4:25 AM      Result Value Range   Sodium 136  135 - 145 mEq/L   Potassium 3.7  3.5 - 5.1 mEq/L   Chloride 103  96 - 112 mEq/L   CO2 25  19 - 32 mEq/L   Glucose, Bld 113 (*) 70 - 99 mg/dL   BUN 19  6 - 23 mg/dL   Creatinine, Ser 4.78 (*) 0.50 - 1.35 mg/dL   Calcium 8.3 (*) 8.4 - 10.5 mg/dL   GFR calc non Af Amer 45 (*) >90 mL/min   GFR calc Af Amer 52 (*) >90 mL/min  GLUCOSE, CAPILLARY     Status: Abnormal   Collection Time    08/21/12  4:25 AM      Result Value Range   Glucose-Capillary 114 (*) 70 - 99 mg/dL  GLUCOSE, CAPILLARY     Status: Abnormal   Collection Time    08/21/12  7:36 AM  Result Value Range   Glucose-Capillary 105 (*) 70 - 99 mg/dL   Comment 1 Documented in Chart     Comment 2 Notify RN        Studies/Results: Dg Abd 1 View  08/20/2012   *RADIOLOGY REPORT*  Clinical Data: Follow up ileus  ABDOMEN - 1 VIEW  Comparison: Prior radiograph 08/18/2012  Findings: Increasing gaseous distension of multiple loops of small bowel in the mid abdomen.  There has been some interval distal progression of previously ingested oral contrast material which now extends to the rectum.  Stable position of surgical drain overlying the right lower quadrant.  No large free air on this supine radiograph.  IMPRESSION: Progression of small bowel distension throughout the midabdomen may represent  worsening ileus or developing obstruction.  Ileus is favored.  There has been some mild distal progression of previously ingested contrast material within the colon.   Original Report Authenticated By: Malachy Moan, M.D.   Dg Fluoro Rm 1-60 Min  08/20/2012   *RADIOLOGY REPORT*  Clinical Data:Inability to place a nasogastric tube at the bedside. Placement  under fluoroscopy requested.  FLOURO RM 1-60 MIN  Fluoroscopy Time: 1 minute 16 seconds  Comparison: Portable chest 08/16/2012.  Neck ultrasound 02/19/2012.  Findings: Attempt to place a nasogastric tube by the radiology technologist were unsuccessful.  I was asked to assist. I tried using both nostrils with the patient in the supine and right lateral decubitus positions.  In the lateral position, fluoroscopy was used to assist guidance of the nasogastric tube.  The tube could not be advanced beyond the nasopharynx. The patient requested that additional times not be made.  IMPRESSION: Unsuccessful nasogastric tube placement.  The tube could not be advanced beyond the posterior nasopharynx. Based on prior ultrasound demonstrating necrotic lymph nodes at the angle of the mandible, neck CT should be considered to exclude an obstructing nasopharyngeal mucosal lesion.   Original Report Authenticated By: Carey Bullocks, M.D.    Medications:  Prior to Admission:  Prescriptions prior to admission  Medication Sig Dispense Refill  . albuterol (ACCUNEB) 1.25 MG/3ML nebulizer solution Take 1 ampule by nebulization 2 (two) times daily.       . bisacodyl (BISACODYL) 5 MG EC tablet Take 10 mg by mouth at bedtime.      . colchicine 0.6 MG tablet Take 0.6 mg by mouth daily.      . Dorzolamide HCl-Timolol Mal (COSOPT OP) Apply 1 drop to eye 2 (two) times daily.       . enalapril (VASOTEC) 10 MG tablet Take 1 tablet (10 mg total) by mouth daily.  30 tablet  6  . famotidine (PEPCID) 20 MG tablet Take 20 mg by mouth 2 (two) times daily.       . fluticasone (FLONASE) 50 MCG/ACT nasal spray Place 2 sprays into the nose daily.      . Fluticasone-Salmeterol (ADVAIR DISKUS) 250-50 MCG/DOSE AEPB Inhale 1 puff into the lungs 2 (two) times daily.        . furosemide (LASIX) 40 MG tablet Take 40 mg by mouth daily.      Marland Kitchen glimepiride (AMARYL) 2 MG tablet Take 2 mg by mouth 2 (two) times daily.        . metoprolol (TOPROL-XL) 50  MG 24 hr tablet Take 50 mg by mouth daily.        . pantoprazole (PROTONIX) 40 MG tablet Take 40 mg by mouth daily.      . polyethylene glycol (MIRALAX /  GLYCOLAX) packet Take 17 g by mouth daily as needed (for constipation).      . potassium chloride (KLOR-CON) 10 MEQ CR tablet Take 10 mEq by mouth 2 (two) times daily.        . simvastatin (ZOCOR) 20 MG tablet Take 20 mg by mouth at bedtime.        . sitaGLIPtin (JANUVIA) 50 MG tablet Take 50 mg by mouth daily.      . Tamsulosin HCl (FLOMAX) 0.4 MG CAPS Take 0.4 mg by mouth daily.        Marland Kitchen tiotropium (SPIRIVA) 18 MCG inhalation capsule Place 18 mcg into inhaler and inhale daily.      . varenicline (CHANTIX) 0.5 MG tablet Take 0.5 mg by mouth 2 (two) times daily.      Marland Kitchen warfarin (COUMADIN) 5 MG tablet Take 5 mg by mouth daily.        Scheduled: . albuterol  2.5 mg Nebulization TID  . budesonide (PULMICORT) nebulizer solution  0.25 mg Nebulization BID  . docusate sodium  200 mg Oral BID  . dorzolamide-timolol  1 drop Both Eyes BID  . feeding supplement  1 Container Oral TID BM  . fluticasone  2 spray Each Nare Daily  . insulin aspart  0-9 Units Subcutaneous Q4H  . metoCLOPramide (REGLAN) injection  5 mg Intravenous Q8H  . metoprolol  2.5 mg Intravenous Q6H  . pantoprazole  40 mg Oral Daily  . piperacillin-tazobactam (ZOSYN)  IV  3.375 g Intravenous Q8H  . polyethylene glycol  17 g Oral Daily  . sodium chloride  3 mL Intravenous Q12H  . tamsulosin  0.4 mg Oral QPC supper  . tiotropium  18 mcg Inhalation Daily   Continuous: . sodium chloride 50 mL/hr at 08/21/12 0600    Assessment/Plan: Abdominal pain-s/p Appendectomy- perforated Appendix- continue ABX-patient not having bowel movement, appears comfortable. Continue postop management per surgery   COPD, oxygen dependent-continue on spiriva/ alb neb prn - CXR Atelectasis use spirometry at bedside and possible OOB- Chair/ ambulate , CAD/ CHF/ AICD/ clinically stable, resume Coumadin  per pharmacy Low platlets- better  Anemia- stable  BPH- with some obstuction- continue flomax- foley for one more day  HTN-resume meds as clinical course allows  DM- SS insulin- po med on hold- BS - ok  Keep foley for now- accurate I/O  CKD stage 3 cr stable baseline .Cr - at base 2.0    LOS: 7 days   Shenay Torti D 08/21/2012, 10:59 AM

## 2012-08-21 NOTE — Progress Notes (Signed)
ANTIBIOTIC CONSULT NOTE - FOLLOW UP  Pharmacy Consult:  Zosyn Indication: Empiric for acute appendicitis  Allergies  Allergen Reactions  . Adhesive (Tape)     Break out    Patient Measurements: Height: 5\' 7"  (170.2 cm) Weight: 204 lb 2.3 oz (92.6 kg) IBW/kg (Calculated) : 66.1  Vital Signs: Temp: 98 F (36.7 C) (08/02 0700) Temp src: Oral (08/02 0700) BP: 145/64 mmHg (08/02 0425) Pulse Rate: 63 (08/02 0425) Intake/Output from previous day: 08/01 0701 - 08/02 0700 In: 575 [I.V.:553] Out: 845 [Urine:800; Drains:45] Intake/Output from this shift: Total I/O In: -  Out: 135 [Urine:100; Drains:35]  Labs:  Recent Labs  08/19/12 0424 08/20/12 0420 08/21/12 0425  WBC 8.9 6.5  --   HGB 10.3* 10.2*  --   PLT 137* 151  --   CREATININE 1.90*  --  1.42*   Estimated Creatinine Clearance: 45 ml/min (by C-G formula based on Cr of 1.42). No results found for this basename: VANCOTROUGH, Leodis Binet, VANCORANDOM, GENTTROUGH, GENTPEAK, GENTRANDOM, TOBRATROUGH, TOBRAPEAK, TOBRARND, AMIKACINPEAK, AMIKACINTROU, AMIKACIN,  in the last 72 hours   Microbiology: Recent Results (from the past 720 hour(s))  MRSA PCR SCREENING     Status: None   Collection Time    08/15/12  5:38 AM      Result Value Range Status   MRSA by PCR NEGATIVE  NEGATIVE Final   Comment:            The GeneXpert MRSA Assay (FDA     approved for NASAL specimens     only), is one component of a     comprehensive MRSA colonization     surveillance program. It is not     intended to diagnose MRSA     infection nor to guide or     monitor treatment for     MRSA infections.   Assessment: 73 YOM admitted with complaints of abdominal pain, found to have acute appendicitis and is now s/p appendectomy.  Patient to continue on Zosyn as empiric therapy.  He has CKD and his renal function is worsening, likely from contrast. SCr improving 2.12 > 1.90 > 1.42. CrCl ~45  No cultures. A feb. WBC WNL.  Abx Day 7  Goal of  Therapy:  Infection prevention / clearance of infection  Plan:  - Continue Zosyn 3.375 gm IV Q8H, 4 hr infusion - Monitor renal function and adjust as needed - F/U resume Coumadin when appropriate - Monitor plts (WNL today)  Jonatha Gagen C. Mecca Guitron, PharmD Clinical Pharmacist-Resident Pager: [phone] Pharmacy: [phone] 08/21/2012 9:19 AM

## 2012-08-21 NOTE — Progress Notes (Signed)
ANTICOAGULATION CONSULT NOTE - Initial Consult  Pharmacy Consult for warfarin  Indication: atrial fibrillation  Allergies  Allergen Reactions  . Adhesive (Tape)     Break out   Patient Measurements: Height: 5\' 7"  (170.2 cm) Weight: 204 lb 2.3 oz (92.6 kg) IBW/kg (Calculated) : 66.1  Vital Signs: Temp: 98 F (36.7 C) (08/02 0700) Temp src: Oral (08/02 0700) BP: 145/64 mmHg (08/02 0425) Pulse Rate: 63 (08/02 0425)  Recent Labs  08/19/12 0424 08/20/12 0420 08/21/12 0425  HGB 10.3* 10.2*  --   HCT 31.0* 31.4*  --   PLT 137* 151  --   CREATININE 1.90*  --  1.42*   Estimated Creatinine Clearance: 45 ml/min (by C-G formula based on Cr of 1.42).  Medical History: Past Medical History  Diagnosis Date  . Emphysema     Oxygen-dependent  . Chronic systolic heart failure   . Implantable cardiac defibrillator infection     July 2012  . ICD (implantable cardiac defibrillator) in place     CRT St Jude; remote - yes  . Nonischemic cardiomyopathy     a. Catheterization 2005 no obstructive coronary disease;  b. 02/2009 Echo: EF 35-40%, Gr 2 DD, mild lvh.  Marland Kitchen HLD (hyperlipidemia)   . Essential hypertension, benign   . Myocardial infarction 1990's  . Peptic ulcer   . GI bleeding   . BPH (benign prostatic hypertrophy)   . DM (diabetes mellitus)   . Coronary atherosclerosis of native coronary artery     Nonobstructive  . Shortness of breath   . Automatic implantable cardioverter-defibrillator in situ   . CHF (congestive heart failure)   . Chronic kidney disease     Medications:  Scheduled:  . albuterol  2.5 mg Nebulization TID  . budesonide (PULMICORT) nebulizer solution  0.25 mg Nebulization BID  . docusate sodium  200 mg Oral BID  . dorzolamide-timolol  1 drop Both Eyes BID  . feeding supplement  1 Container Oral TID BM  . fluticasone  2 spray Each Nare Daily  . insulin aspart  0-9 Units Subcutaneous Q4H  . metoCLOPramide (REGLAN) injection  5 mg Intravenous Q8H  .  metoprolol  2.5 mg Intravenous Q6H  . pantoprazole  40 mg Oral Daily  . piperacillin-tazobactam (ZOSYN)  IV  3.375 g Intravenous Q8H  . polyethylene glycol  17 g Oral Daily  . sodium chloride  3 mL Intravenous Q12H  . tamsulosin  0.4 mg Oral QPC supper  . tiotropium  18 mcg Inhalation Daily   Assessment: 82 YOM admitted with complaints of abdominal pain, found to have acute appendicitis and now s/p appendectomy (POD 5). Pharmacy consulted to reinitiate his warfarin therapy. Patient on 5 mg daily PTA.   INR 1.63 on 7/28  Goal of Therapy:  INR 2-3 Monitor platelets by anticoagulation protocol: Yes   Plan:  -Initiate warfarin 7.5 mg per dosing chart (1.5 x home dose) -monitor daily INRs, SXS bleeding - f/u with education  Malaki Koury C. Luv Mish, PharmD Clinical Pharmacist-Resident Pager: [phone] Pharmacy: [phone] 08/21/2012 12:37 PM

## 2012-08-21 NOTE — Progress Notes (Addendum)
[facility] Progress Note 5 Days Post-Op  Subjective: Patient a little short of breath.  Sitting up in chair.  Emesis basin nearby.  Not in distress.  Some small volume vomiting.  Having bowel movements.  Objective: Vital signs in last 24 hours: Temp:  [97.8 F (36.6 C)-98.5 F (36.9 C)] 97.8 F (36.6 C) (08/02 0425) Pulse Rate:  [60-74] 63 (08/02 0425) Resp:  [17-22] 17 (08/02 0425) BP: (128-149)/(64-73) 145/64 mmHg (08/02 0425) SpO2:  [97 %-99 %] 97 % (08/02 0425) Weight:  [92.6 kg (204 lb 2.3 oz)] 92.6 kg (204 lb 2.3 oz) (08/02 0500) Last BM Date: 08/20/12  Intake/Output from previous day: 08/01 0701 - 08/02 0700 In: 575 [I.V.:553] Out: 845 [Urine:800; Drains:45] Intake/Output this shift: Total I/O In: -  Out: 35 [Drains:35]  General: No acute distress, but gets a bit SOB with talking  Lungs: Clear  Abd: Distended, not tense, good bowel sounds.  Nontender.  Wounds are okay.  Extremities: No clinical evidence of DVT  Neuro: Intact  Lab Results:  @LABLAST2 (wbc:2,hgb:2,hct:2,plt:2) BMET  Recent Labs  08/19/12 0424 08/21/12 0425  NA 136 136  K 3.7 3.7  CL 102 103  CO2 27 25  GLUCOSE 131* 113*  BUN 28* 19  CREATININE 1.90* 1.42*  CALCIUM 8.2* 8.3*   PT/INR No results found for this basename: LABPROT, INR,  in the last 72 hours ABG No results found for this basename: PHART, PCO2, PO2, HCO3,  in the last 72 hours  Studies/Results: Dg Abd 1 View  08/20/2012   *RADIOLOGY REPORT*  Clinical Data: Follow up ileus  ABDOMEN - 1 VIEW  Comparison: Prior radiograph 08/18/2012  Findings: Increasing gaseous distension of multiple loops of small bowel in the mid abdomen.  There has been some interval distal progression of previously ingested oral contrast material which now extends to the rectum.  Stable position of surgical drain overlying the right lower quadrant.  No large free air on this supine radiograph.  IMPRESSION: Progression of small bowel distension throughout the  midabdomen may represent  worsening ileus or developing obstruction.  Ileus is favored.  There has been some mild distal progression of previously ingested contrast material within the colon.   Original Report Authenticated By: Malachy Moan, M.D.   Dg Fluoro Rm 1-60 Min  08/20/2012   *RADIOLOGY REPORT*  Clinical Data:Inability to place a nasogastric tube at the bedside. Placement under fluoroscopy requested.  FLOURO RM 1-60 MIN  Fluoroscopy Time: 1 minute 16 seconds  Comparison: Portable chest 08/16/2012.  Neck ultrasound 02/19/2012.  Findings: Attempt to place a nasogastric tube by the radiology technologist were unsuccessful.  I was asked to assist. I tried using both nostrils with the patient in the supine and right lateral decubitus positions.  In the lateral position, fluoroscopy was used to assist guidance of the nasogastric tube.  The tube could not be advanced beyond the nasopharynx. The patient requested that additional times not be made.  IMPRESSION: Unsuccessful nasogastric tube placement.  The tube could not be advanced beyond the posterior nasopharynx. Based on prior ultrasound demonstrating necrotic lymph nodes at the angle of the mandible, neck CT should be considered to exclude an obstructing nasopharyngeal mucosal lesion.   Original Report Authenticated By: Carey Bullocks, M.D.    Anti-infectives: Anti-infectives   Start     Dose/Rate Route Frequency Ordered Stop   08/15/12 0337  piperacillin-tazobactam (ZOSYN) IVPB 3.375 g     3.375 g 12.5 mL/hr over 240 Minutes Intravenous 3 times per day 08/15/12  1610        Assessment/Plan: s/p Procedure(s): APPENDECTOMY LAPAROSCOPIC Advance diet Continue ABX therapy due to Post-op infection Continue foley due to acute urinary retention Start on full liquids. Okay to start coumadin  LOS: 7 days   Marta Lamas. Gae Bon, MD, FACS [phone] [facility] Surgery 08/21/2012

## 2012-08-22 LAB — GLUCOSE, CAPILLARY
Glucose-Capillary: 138 mg/dL — ABNORMAL HIGH (ref 70–99)
Glucose-Capillary: 178 mg/dL — ABNORMAL HIGH (ref 70–99)

## 2012-08-22 MED ORDER — WARFARIN SODIUM 7.5 MG PO TABS
7.5000 mg | ORAL_TABLET | Freq: Once | ORAL | Status: AC
  Administered 2012-08-22: 7.5 mg via ORAL
  Filled 2012-08-22: qty 1

## 2012-08-22 MED ORDER — INSULIN ASPART 100 UNIT/ML ~~LOC~~ SOLN
0.0000 [IU] | Freq: Three times a day (TID) | SUBCUTANEOUS | Status: DC
  Administered 2012-08-23: 2 [IU] via SUBCUTANEOUS
  Administered 2012-08-23: 3 [IU] via SUBCUTANEOUS
  Administered 2012-08-23: 2 [IU] via SUBCUTANEOUS
  Administered 2012-08-24: 3 [IU] via SUBCUTANEOUS
  Administered 2012-08-24: 2 [IU] via SUBCUTANEOUS

## 2012-08-22 NOTE — Progress Notes (Signed)
Subjective: Patient feeling good, had a bowel movement. Tolerated by mouth intake. No fever chills no shortness of breath.  Objective: Vital signs in last 24 hours: Temp:  [97.5 F (36.4 C)-98.4 F (36.9 C)] 97.5 F (36.4 C) (08/03 0700) Pulse Rate:  [59-81] 59 (08/03 0343) Resp:  [17-24] 20 (08/03 0343) BP: (132-145)/(57-72) 132/64 mmHg (08/03 0343) SpO2:  [91 %-100 %] 100 % (08/03 0842) FiO2 (%):  [28 %] 28 % (08/03 0842) Weight:  [93.3 kg (205 lb 11 oz)] 93.3 kg (205 lb 11 oz) (08/03 0500) Weight change: 0.7 kg (1 lb 8.7 oz) Last BM Date: 08/21/12  Intake/Output from previous day: 08/02 0701 - 08/03 0700 In: 780 [P.O.:180; I.V.:600] Out: 996 [Urine:900; Drains:95; Stool:1] Intake/Output this shift: Total I/O In: -  Out: 125 [Urine:125]  General appearance: alert and cooperative Resp: clear to auscultation bilaterally Cardio: regular rate and rhythm, S1, S2 normal, no murmur, click, rub or gallop GI: Abdomen distended soft, positive bowel sounds Extremities: extremities normal, atraumatic, no cyanosis or edema  Lab Results:  Results for orders placed during the hospital encounter of 08/14/12 (from the past 24 hour(s))  GLUCOSE, CAPILLARY     Status: Abnormal   Collection Time    08/21/12 12:18 PM      Result Value Range   Glucose-Capillary 166 (*) 70 - 99 mg/dL   Comment 1 Documented in Chart     Comment 2 Notify RN    GLUCOSE, CAPILLARY     Status: Abnormal   Collection Time    08/21/12  3:01 PM      Result Value Range   Glucose-Capillary 190 (*) 70 - 99 mg/dL   Comment 1 Documented in Chart     Comment 2 Notify RN    GLUCOSE, CAPILLARY     Status: Abnormal   Collection Time    08/21/12  7:47 PM      Result Value Range   Glucose-Capillary 174 (*) 70 - 99 mg/dL   Comment 1 Notify RN    GLUCOSE, CAPILLARY     Status: Abnormal   Collection Time    08/21/12 11:27 PM      Result Value Range   Glucose-Capillary 188 (*) 70 - 99 mg/dL   Comment 1 Notify RN     GLUCOSE, CAPILLARY     Status: Abnormal   Collection Time    08/22/12  3:40 AM      Result Value Range   Glucose-Capillary 138 (*) 70 - 99 mg/dL   Comment 1 Notify RN    PROTIME-INR     Status: Abnormal   Collection Time    08/22/12  5:28 AM      Result Value Range   Prothrombin Time 16.8 (*) 11.6 - 15.2 seconds   INR 1.40  0.00 - 1.49  GLUCOSE, CAPILLARY     Status: Abnormal   Collection Time    08/22/12  7:29 AM      Result Value Range   Glucose-Capillary 106 (*) 70 - 99 mg/dL   Comment 1 Documented in Chart     Comment 2 Notify RN        Studies/Results: Dg Fluoro Rm 1-60 Min  08/20/2012   *RADIOLOGY REPORT*  Clinical Data:Inability to place a nasogastric tube at the bedside. Placement under fluoroscopy requested.  FLOURO RM 1-60 MIN  Fluoroscopy Time: 1 minute 16 seconds  Comparison: Portable chest 08/16/2012.  Neck ultrasound 02/19/2012.  Findings: Attempt to place a nasogastric tube by the  radiology technologist were unsuccessful.  I was asked to assist. I tried using both nostrils with the patient in the supine and right lateral decubitus positions.  In the lateral position, fluoroscopy was used to assist guidance of the nasogastric tube.  The tube could not be advanced beyond the nasopharynx. The patient requested that additional times not be made.  IMPRESSION: Unsuccessful nasogastric tube placement.  The tube could not be advanced beyond the posterior nasopharynx. Based on prior ultrasound demonstrating necrotic lymph nodes at the angle of the mandible, neck CT should be considered to exclude an obstructing nasopharyngeal mucosal lesion.   Original Report Authenticated By: Carey Bullocks, M.D.    Medications:  Prior to Admission:  Prescriptions prior to admission  Medication Sig Dispense Refill  . albuterol (ACCUNEB) 1.25 MG/3ML nebulizer solution Take 1 ampule by nebulization 2 (two) times daily.       . bisacodyl (BISACODYL) 5 MG EC tablet Take 10 mg by mouth at bedtime.       . colchicine 0.6 MG tablet Take 0.6 mg by mouth daily.      . Dorzolamide HCl-Timolol Mal (COSOPT OP) Apply 1 drop to eye 2 (two) times daily.       . enalapril (VASOTEC) 10 MG tablet Take 1 tablet (10 mg total) by mouth daily.  30 tablet  6  . famotidine (PEPCID) 20 MG tablet Take 20 mg by mouth 2 (two) times daily.       . fluticasone (FLONASE) 50 MCG/ACT nasal spray Place 2 sprays into the nose daily.      . Fluticasone-Salmeterol (ADVAIR DISKUS) 250-50 MCG/DOSE AEPB Inhale 1 puff into the lungs 2 (two) times daily.        . furosemide (LASIX) 40 MG tablet Take 40 mg by mouth daily.      Marland Kitchen glimepiride (AMARYL) 2 MG tablet Take 2 mg by mouth 2 (two) times daily.        . metoprolol (TOPROL-XL) 50 MG 24 hr tablet Take 50 mg by mouth daily.        . pantoprazole (PROTONIX) 40 MG tablet Take 40 mg by mouth daily.      . polyethylene glycol (MIRALAX / GLYCOLAX) packet Take 17 g by mouth daily as needed (for constipation).      . potassium chloride (KLOR-CON) 10 MEQ CR tablet Take 10 mEq by mouth 2 (two) times daily.        . simvastatin (ZOCOR) 20 MG tablet Take 20 mg by mouth at bedtime.        . sitaGLIPtin (JANUVIA) 50 MG tablet Take 50 mg by mouth daily.      . Tamsulosin HCl (FLOMAX) 0.4 MG CAPS Take 0.4 mg by mouth daily.        Marland Kitchen tiotropium (SPIRIVA) 18 MCG inhalation capsule Place 18 mcg into inhaler and inhale daily.      . varenicline (CHANTIX) 0.5 MG tablet Take 0.5 mg by mouth 2 (two) times daily.      Marland Kitchen warfarin (COUMADIN) 5 MG tablet Take 5 mg by mouth daily.        Scheduled: . albuterol  2.5 mg Nebulization TID  . budesonide (PULMICORT) nebulizer solution  0.25 mg Nebulization BID  . docusate sodium  200 mg Oral BID  . dorzolamide-timolol  1 drop Both Eyes BID  . feeding supplement  1 Container Oral TID BM  . fluticasone  2 spray Each Nare Daily  . insulin aspart  0-9 Units Subcutaneous Q4H  .  metoCLOPramide (REGLAN) injection  5 mg Intravenous Q8H  . metoprolol  2.5  mg Intravenous Q6H  . pantoprazole  40 mg Oral Daily  . piperacillin-tazobactam (ZOSYN)  IV  3.375 g Intravenous Q8H  . polyethylene glycol  17 g Oral Daily  . sodium chloride  3 mL Intravenous Q12H  . tamsulosin  0.4 mg Oral QPC supper  . tiotropium  18 mcg Inhalation Daily  . warfarin  7.5 mg Oral ONCE-1800  . Warfarin - Pharmacist Dosing Inpatient   Does not apply q1800   Continuous: . sodium chloride 50 mL/hr at 08/21/12 0600    Assessment/Plan: Abdominal pain-s/p Appendectomy- perforated Appendix- continue ABX-patient now having bowel movement, appears comfortable. Continue postop management per surgery  COPD, oxygen dependent-continue on spiriva/ alb neb prn - CXR Atelectasis use spirometry at bedside and possible OOB- Chair/ ambulate ,  CAD/ CHF/ AICD/ clinically stable, back on coumadin Coumadin per pharmacy  Low platlets- better  Anemia- stable  BPH- with some obstuction- continue flomax- foley for one more day  HTN-resume meds as clinical course allows  DM- SS insulin- po med on hold- BS - ok  Keep foley for now- accurate I/O  CKD stage 3 cr stable baseline .Cr - at base 2.0     LOS: 8 days   Robert Davies D 08/22/2012, 10:26 AM

## 2012-08-22 NOTE — Progress Notes (Signed)
ANTICOAGULATION CONSULT NOTE - Follow Up Consult  Pharmacy Consult for warfarin  Indication: atrial fibrillation  Allergies  Allergen Reactions  . Adhesive (Tape)     Break out   Patient Measurements: Height: 5\' 7"  (170.2 cm) Weight: 205 lb 11 oz (93.3 kg) IBW/kg (Calculated) : 66.1  Vital Signs: Temp: 98 F (36.7 C) (08/03 0343) Temp src: Oral (08/03 0343) BP: 132/64 mmHg (08/03 0343) Pulse Rate: 59 (08/03 0343)  Recent Labs  08/20/12 0420 08/21/12 0425 08/22/12 0528  HGB 10.2*  --   --   HCT 31.4*  --   --   PLT 151  --   --   LABPROT  --   --  16.8*  INR  --   --  1.40  CREATININE  --  1.42*  --    Estimated Creatinine Clearance: 45.2 ml/min (by C-G formula based on Cr of 1.42).  Medical History: Past Medical History  Diagnosis Date  . Emphysema     Oxygen-dependent  . Chronic systolic heart failure   . Implantable cardiac defibrillator infection     July 2012  . ICD (implantable cardiac defibrillator) in place     CRT St [employer]; remote - yes  . Nonischemic cardiomyopathy     a. Catheterization 2005 no obstructive coronary disease;  b. 02/2009 Echo: EF 35-40%, Gr 2 DD, mild lvh.  Marland Kitchen HLD (hyperlipidemia)   . Essential hypertension, benign   . Myocardial infarction 1990's  . Peptic ulcer   . GI bleeding   . BPH (benign prostatic hypertrophy)   . DM (diabetes mellitus)   . Coronary atherosclerosis of native coronary artery     Nonobstructive  . Shortness of breath   . Automatic implantable cardioverter-defibrillator in situ   . CHF (congestive heart failure)   . Chronic kidney disease     Medications:  Scheduled:  . albuterol  2.5 mg Nebulization TID  . budesonide (PULMICORT) nebulizer solution  0.25 mg Nebulization BID  . docusate sodium  200 mg Oral BID  . dorzolamide-timolol  1 drop Both Eyes BID  . feeding supplement  1 Container Oral TID BM  . fluticasone  2 spray Each Nare Daily  . insulin aspart  0-9 Units Subcutaneous Q4H  . metoCLOPramide  (REGLAN) injection  5 mg Intravenous Q8H  . metoprolol  2.5 mg Intravenous Q6H  . pantoprazole  40 mg Oral Daily  . piperacillin-tazobactam (ZOSYN)  IV  3.375 g Intravenous Q8H  . polyethylene glycol  17 g Oral Daily  . sodium chloride  3 mL Intravenous Q12H  . tamsulosin  0.4 mg Oral QPC supper  . tiotropium  18 mcg Inhalation Daily  . Warfarin - Pharmacist Dosing Inpatient   Does not apply q1800   Assessment: [name] admitted with complaints of abdominal pain, found to have acute appendicitis and now s/p appendectomy (POD 5). Pharmacy consulted to reinitiate his warfarin therapy. Patient on 5 mg daily PTA.  Warfarin resumed yesterday. Subtherapeutic INR today at 1.4. No complications noted.  Goal of Therapy:  INR 2-3 Monitor platelets by anticoagulation protocol: Yes   Plan:  -Repeat warfarin 7.5 mg today  -Monitor daily INRs, SXS bleeding - f/u with education  Robert Davies, PharmD Clinical Pharmacist-Resident Pager: [phone] Pharmacy: [phone] 08/22/2012 7:23 AM

## 2012-08-22 NOTE — Progress Notes (Signed)
[facility]/Robert Davies Progress Note 6 Days Post-Op  Subjective: Patient sitting up in chair.  Feels good.  Had two BM last night.  Wants more to eat  Objective: Vital signs in last 24 hours: Temp:  [97.5 F (36.4 C)-98.4 F (36.9 C)] 97.5 F (36.4 C) (08/03 0700) Pulse Rate:  [59-81] 59 (08/03 0343) Resp:  [17-24] 20 (08/03 0343) BP: (132-145)/(57-72) 132/64 mmHg (08/03 0343) SpO2:  [91 %-100 %] 98 % (08/03 0343) Weight:  [93.3 kg (205 lb 11 oz)] 93.3 kg (205 lb 11 oz) (08/03 0500) Last BM Date: 08/21/12  Intake/Output from previous day: 08/02 0701 - 08/03 0700 In: 780 [P.O.:180; I.V.:600] Out: 996 [Urine:900; Drains:95; Stool:1] Intake/Output this shift: Total I/O In: -  Out: 125 [Urine:125]  General: No acute distress  Lungs: Clear  Abd: Softer than yesterday.  BMs as described.  Extremities: No changes  Neuro: Intact  Lab Results:  @LABLAST2 (wbc:2,hgb:2,hct:2,plt:2) BMET  Recent Labs  08/21/12 0425  NA 136  K 3.7  CL 103  CO2 25  GLUCOSE 113*  BUN 19  CREATININE 1.42*  CALCIUM 8.3*   PT/INR  Recent Labs  08/22/12 0528  LABPROT 16.8*  INR 1.40   ABG No results found for this basename: PHART, PCO2, PO2, HCO3,  in the last 72 hours  Studies/Results: Dg Fluoro Rm 1-60 Min  08/20/2012   *RADIOLOGY REPORT*  Clinical Data:Inability to place a nasogastric tube at the bedside. Placement under fluoroscopy requested.  FLOURO RM 1-60 MIN  Fluoroscopy Time: 1 minute 16 seconds  Comparison: Portable chest 08/16/2012.  Neck ultrasound 02/19/2012.  Findings: Attempt to place a nasogastric tube by the radiology technologist were unsuccessful.  I was asked to assist. I tried using both nostrils with the patient in the supine and right lateral decubitus positions.  In the lateral position, fluoroscopy was used to assist guidance of the nasogastric tube.  The tube could not be advanced beyond the nasopharynx. The patient requested that additional times not be made.  IMPRESSION:  Unsuccessful nasogastric tube placement.  The tube could not be advanced beyond the posterior nasopharynx. Based on prior ultrasound demonstrating necrotic lymph nodes at the angle of the mandible, neck CT should be considered to exclude an obstructing nasopharyngeal mucosal lesion.   Original Report Authenticated By: Carey Bullocks, M.D.    Anti-infectives: Anti-infectives   Start     Dose/Rate Route Frequency Ordered Stop   08/15/12 0337  piperacillin-tazobactam (ZOSYN) IVPB 3.375 g     3.375 g 12.5 mL/hr over 240 Minutes Intravenous 3 times per day 08/15/12 1610        Assessment/Plan: s/p Procedure(s): APPENDECTOMY LAPAROSCOPIC Advance diet Continue ABX therapy due to Post-op infection  LOS: 8 days   Marta Lamas. Gae Bon, MD, FACS [phone] [facility] Surgery 08/22/2012

## 2012-08-23 LAB — GLUCOSE, CAPILLARY

## 2012-08-23 LAB — PROTIME-INR
INR: 1.58 — ABNORMAL HIGH (ref 0.00–1.49)
Prothrombin Time: 18.4 s — ABNORMAL HIGH (ref 11.6–15.2)

## 2012-08-23 MED ORDER — AMOXICILLIN-POT CLAVULANATE 875-125 MG PO TABS
1.0000 | ORAL_TABLET | Freq: Two times a day (BID) | ORAL | Status: DC
  Administered 2012-08-23 – 2012-08-24 (×3): 1 via ORAL
  Filled 2012-08-23 (×4): qty 1

## 2012-08-23 MED ORDER — WARFARIN SODIUM 7.5 MG PO TABS
7.5000 mg | ORAL_TABLET | Freq: Once | ORAL | Status: AC
  Administered 2012-08-23: 7.5 mg via ORAL
  Filled 2012-08-23: qty 1

## 2012-08-23 MED ORDER — AMOXICILLIN-POT CLAVULANATE 875-125 MG PO TABS
1.0000 | ORAL_TABLET | Freq: Two times a day (BID) | ORAL | Status: DC
  Filled 2012-08-23 (×2): qty 1

## 2012-08-23 MED ORDER — COLCHICINE 0.6 MG PO TABS
0.6000 mg | ORAL_TABLET | Freq: Every day | ORAL | Status: DC
  Administered 2012-08-23 – 2012-08-24 (×2): 0.6 mg via ORAL
  Filled 2012-08-23 (×2): qty 1

## 2012-08-23 MED ORDER — PREDNISONE 20 MG PO TABS
20.0000 mg | ORAL_TABLET | Freq: Every day | ORAL | Status: DC
Start: 2012-08-24 — End: 2012-08-24
  Administered 2012-08-24: 20 mg via ORAL
  Filled 2012-08-23 (×2): qty 1

## 2012-08-23 NOTE — Progress Notes (Signed)
CSW spoke with Ms. Robert Davies (Dtr [phone]) about placement options.   Dtr and sister in law would like Pt to go to [facility].   CSW will notify facility rep in hospital  about choice.   CSW will continue to follow for d/c planning   Leron Croak, LCSWA [facility] Emergency Dept.  [phone]

## 2012-08-23 NOTE — Progress Notes (Signed)
Subjective: Patient without complaint of fever or chills, no shortness of breath. He continues to have bowel movements. He does complain of pain in his left knee, left ankle consistent with his gout.  Objective: Vital signs in last 24 hours: Temp:  [97.3 F (36.3 C)-99.3 F (37.4 C)] 97.3 F (36.3 C) (08/04 0739) Pulse Rate:  [59-68] 61 (08/04 0337) Resp:  [15-25] 15 (08/04 0337) BP: (114-157)/(50-73) 114/50 mmHg (08/04 0337) SpO2:  [91 %-100 %] 97 % (08/04 0337) FiO2 (%):  [28 %-32 %] 32 % (08/03 1358) Weight:  [94.8 kg (208 lb 15.9 oz)] 94.8 kg (208 lb 15.9 oz) (08/04 0500) Weight change: 1.5 kg (3 lb 4.9 oz) Last BM Date: 08/22/12  Intake/Output from previous day: 08/03 0701 - 08/04 0700 In: 1363.3 [P.O.:300; I.V.:413.3; IV Piggyback:650] Out: 766 [Urine:725; Drains:40; Stool:1] Intake/Output this shift: Total I/O In: -  Out: 310 [Urine:300; Drains:10]  General appearance: alert and cooperative Resp: Moderate air movement bilateral without wheezes or rales Cardio: regular rate and rhythm, S1, S2 normal, no murmur, click, rub or gallop GI: Abdomen soft, positive bowel sounds Extremities: Patient complains of pain with movement of left knee, left ankle, there is no significant warmth or erythema  Lab Results:  Results for orders placed during the hospital encounter of 08/14/12 (from the past 24 hour(s))  GLUCOSE, CAPILLARY     Status: Abnormal   Collection Time    08/22/12 11:38 AM      Result Value Range   Glucose-Capillary 169 (*) 70 - 99 mg/dL   Comment 1 Documented in Chart     Comment 2 Notify RN    GLUCOSE, CAPILLARY     Status: Abnormal   Collection Time    08/22/12  4:29 PM      Result Value Range   Glucose-Capillary 178 (*) 70 - 99 mg/dL   Comment 1 Notify RN     Comment 2 Documented in Chart    GLUCOSE, CAPILLARY     Status: Abnormal   Collection Time    08/22/12 10:05 PM      Result Value Range   Glucose-Capillary 197 (*) 70 - 99 mg/dL   Comment 1  Notify RN    PROTIME-INR     Status: Abnormal   Collection Time    08/23/12  4:59 AM      Result Value Range   Prothrombin Time 18.4 (*) 11.6 - 15.2 seconds   INR 1.58 (*) 0.00 - 1.49      Studies/Results: No results found.  Medications:  Prior to Admission:  Prescriptions prior to admission  Medication Sig Dispense Refill  . albuterol (ACCUNEB) 1.25 MG/3ML nebulizer solution Take 1 ampule by nebulization 2 (two) times daily.       . bisacodyl (BISACODYL) 5 MG EC tablet Take 10 mg by mouth at bedtime.      . colchicine 0.6 MG tablet Take 0.6 mg by mouth daily.      . Dorzolamide HCl-Timolol Mal (COSOPT OP) Apply 1 drop to eye 2 (two) times daily.       . enalapril (VASOTEC) 10 MG tablet Take 1 tablet (10 mg total) by mouth daily.  30 tablet  6  . famotidine (PEPCID) 20 MG tablet Take 20 mg by mouth 2 (two) times daily.       . fluticasone (FLONASE) 50 MCG/ACT nasal spray Place 2 sprays into the nose daily.      . Fluticasone-Salmeterol (ADVAIR DISKUS) 250-50 MCG/DOSE AEPB Inhale 1 puff  into the lungs 2 (two) times daily.        . furosemide (LASIX) 40 MG tablet Take 40 mg by mouth daily.      Marland Kitchen glimepiride (AMARYL) 2 MG tablet Take 2 mg by mouth 2 (two) times daily.        . metoprolol (TOPROL-XL) 50 MG 24 hr tablet Take 50 mg by mouth daily.        . pantoprazole (PROTONIX) 40 MG tablet Take 40 mg by mouth daily.      . polyethylene glycol (MIRALAX / GLYCOLAX) packet Take 17 g by mouth daily as needed (for constipation).      . potassium chloride (KLOR-CON) 10 MEQ CR tablet Take 10 mEq by mouth 2 (two) times daily.        . simvastatin (ZOCOR) 20 MG tablet Take 20 mg by mouth at bedtime.        . sitaGLIPtin (JANUVIA) 50 MG tablet Take 50 mg by mouth daily.      . Tamsulosin HCl (FLOMAX) 0.4 MG CAPS Take 0.4 mg by mouth daily.        Marland Kitchen tiotropium (SPIRIVA) 18 MCG inhalation capsule Place 18 mcg into inhaler and inhale daily.      . varenicline (CHANTIX) 0.5 MG tablet Take 0.5 mg  by mouth 2 (two) times daily.      Marland Kitchen warfarin (COUMADIN) 5 MG tablet Take 5 mg by mouth daily.        Scheduled: . albuterol  2.5 mg Nebulization TID  . budesonide (PULMICORT) nebulizer solution  0.25 mg Nebulization BID  . docusate sodium  200 mg Oral BID  . dorzolamide-timolol  1 drop Both Eyes BID  . feeding supplement  1 Container Oral TID BM  . fluticasone  2 spray Each Nare Daily  . insulin aspart  0-9 Units Subcutaneous TID WC  . metoCLOPramide (REGLAN) injection  5 mg Intravenous Q8H  . metoprolol  2.5 mg Intravenous Q6H  . pantoprazole  40 mg Oral Daily  . piperacillin-tazobactam (ZOSYN)  IV  3.375 g Intravenous Q8H  . polyethylene glycol  17 g Oral Daily  . sodium chloride  3 mL Intravenous Q12H  . tamsulosin  0.4 mg Oral QPC supper  . tiotropium  18 mcg Inhalation Daily  . Warfarin - Pharmacist Dosing Inpatient   Does not apply q1800   Continuous: . sodium chloride 10 mL/hr at 08/22/12 1045    Assessment/Plan: Abdominal pain-s/p Appendectomy- perforated Appendix- continue ABX, stop antibiotics when cleared by surgery-patient now having bowel movement, appears comfortable. Continue postop management per surgery  COPD, oxygen dependent-continue on spiriva/ alb neb prn - CXR Atelectasis use spirometry at bedside and possible OOB- Chair/ ambulate ,  CAD/ CHF/ AICD/ clinically stable, back on coumadin Coumadin per pharmacy  Low platlets- better  Anemia- stable  BPH- with some obstuction- continue flomax- foley for one more day  HTN-resume meds as clinical course allows  DM- SS insulin- po med on hold- BS - ok  Keep foley for now- accurate I/O  CKD stage 3 cr stable baseline .Cr - at base 2.0 Gout, resume colchicine, low-dose prednisone will be added   LOS: 9 days   Nichoel Digiulio D 08/23/2012, 8:23 AM

## 2012-08-23 NOTE — Progress Notes (Signed)
Patient ID: Robert Davies, male   DOB: September 17, 1930, 79 y.o.   MRN: [id number] 7 Days Post-Op  Subjective: Pt feels well.  Minimal abdominal pain.  Tolerating regular diet.  Continues to have soft BMs  Objective: Vital signs in last 24 hours: Temp:  [97.3 F (36.3 C)-99.3 F (37.4 C)] 97.3 F (36.3 C) (08/04 0739) Pulse Rate:  [59-68] 61 (08/04 0337) Resp:  [15-25] 15 (08/04 0337) BP: (114-157)/(50-73) 114/50 mmHg (08/04 0337) SpO2:  [91 %-100 %] 97 % (08/04 0337) FiO2 (%):  [28 %-32 %] 32 % (08/03 1358) Weight:  [208 lb 15.9 oz (94.8 kg)] 208 lb 15.9 oz (94.8 kg) (08/04 0500) Last BM Date: 08/22/12  Intake/Output from previous day: 08/03 0701 - 08/04 0700 In: 1363.3 [P.O.:300; I.V.:413.3; IV Piggyback:650] Out: 766 [Urine:725; Drains:40; Stool:1] Intake/Output this shift: Total I/O In: -  Out: 310 [Urine:300; Drains:10]  PE: Abd: soft, minimally tender, incisions c/d/i.  Ecchymosis around LLQ incision much improved!  +BS, ND, JP with only serous output  Lab Results:  No results found for this basename: WBC, HGB, HCT, PLT,  in the last 72 hours BMET  Recent Labs  08/21/12 0425  NA 136  K 3.7  CL 103  CO2 25  GLUCOSE 113*  BUN 19  CREATININE 1.42*  CALCIUM 8.3*   PT/INR  Recent Labs  08/22/12 0528 08/23/12 0459  LABPROT 16.8* 18.4*  INR 1.40 1.58*   CMP     Component Value Date/Time   NA 136 08/21/2012 0425   K 3.7 08/21/2012 0425   CL 103 08/21/2012 0425   CO2 25 08/21/2012 0425   GLUCOSE 113* 08/21/2012 0425   BUN 19 08/21/2012 0425   CREATININE 1.42* 08/21/2012 0425   CALCIUM 8.3* 08/21/2012 0425   PROT 6.8 08/16/2012 0535   ALBUMIN 3.3* 08/16/2012 0535   AST 15 08/16/2012 0535   ALT 12 08/16/2012 0535   ALKPHOS 47 08/16/2012 0535   BILITOT 1.0 08/16/2012 0535   GFRNONAA 45* 08/21/2012 0425   GFRAA 52* 08/21/2012 0425   Lipase     Component Value Date/Time   LIPASE 36 08/14/2012 2035       Studies/Results: No results  found.  Anti-infectives: Anti-infectives   Start     Dose/Rate Route Frequency Ordered Stop   08/15/12 0337  piperacillin-tazobactam (ZOSYN) IVPB 3.375 g     3.375 g 12.5 mL/hr over 240 Minutes Intravenous 3 times per day 08/15/12 0337         Assessment/Plan  1. S/p lap appy for perf appy   Plan: 1. Patient is doing well surgically.  He is tolerating a regular diet and his bowels are working well. 2. We can dc JP drain today (spoke with Dr. Janee Morn about that).  It has only put out 40cc of serous fluid within the last 24 hrs. 3. He will need a total of 10 days of abx therapy.  Today is Day 9 of zosyn.  I have dc this and started him on oral Augmentin 4. Patient is surgically stable for dc whenever medically stable.  LOS: 9 days    Navraj Dreibelbis E 08/23/2012, 8:31 AM Pager: [phone]

## 2012-08-23 NOTE — Progress Notes (Signed)
CSW spoke with the Dtr Letitia Libra [phone] concerning d/c planning.   Dtr stated that her sister in law is a Education administrator and may be able to take care of Pt at home with HHPT.   Dtr Ms. Effie Shy will give her a call and ask if she would be willing to take Pt home with HHPT.   CSW awaiting a call back   Leron Croak, LCSWA [facility] Emergency Dept.  [phone]

## 2012-08-23 NOTE — Progress Notes (Signed)
ANTICOAGULATION CONSULT NOTE - Follow Up Consult  Pharmacy Consult for Coumadin Indication: Afib?  Allergies  Allergen Reactions  . Adhesive (Tape)     Break out    Patient Measurements: Height: 5\' 7"  (170.2 cm) Weight: 208 lb 15.9 oz (94.8 kg) IBW/kg (Calculated) : 66.1 Heparin Dosing Weight:   Vital Signs: Temp: 97.3 F (36.3 C) (08/04 0739) Temp src: Oral (08/04 0739) BP: 125/84 mmHg (08/04 0739) Pulse Rate: 61 (08/04 0739)  Labs:  Recent Labs  08/21/12 0425 08/22/12 0528 08/23/12 0459  LABPROT  --  16.8* 18.4*  INR  --  1.40 1.58*  CREATININE 1.42*  --   --     Estimated Creatinine Clearance: 45.5 ml/min (by C-G formula based on Cr of 1.42).  Assessment: 82yom continues on Coumadin for possible Afib per MD notes. INR (1.58) is subtherapeutic but is trending up since Coumadin restart on 8/2 - will repeat Coumadin 7.5mg  tonight.  - No new CBC - No bleeding reported - Coumadin PTA: 5mg  daily --> may need 7.5mg  2-3 times per week to maintain therapeutic INR (admit INR 1.75)  Goal of Therapy:  INR 2-3   Plan:  1. Repeat Coumadin 7.5mg  po x 1 today 2. Follow-up AM INR  Cleon Dew [phone] 08/23/2012,11:32 AM

## 2012-08-23 NOTE — Progress Notes (Signed)
Agree with above 

## 2012-08-23 NOTE — Progress Notes (Signed)
Physical Therapy Treatment Patient Details Name: Robert Davies MRN: [id number] DOB: Nov 22, 1930 Today's Date: 08/23/2012 Time: 2952-8413 PT Time Calculation (min): 25 min  PT Assessment / Plan / Recommendation  History of Present Illness Pt s/p appendectomy due to perforated appendix.  Pt also with COPD on home O2 as well as AICD.  Pt currently also having gout flare in lt knee and ankle.   PT Comments   Pt making steady progress but continues to need ST-SNF.  Follow Up Recommendations  SNF     Does the patient have the potential to tolerate intense rehabilitation     Barriers to Discharge        Equipment Recommendations  None recommended by PT    Recommendations for Other Services    Frequency Min 3X/week   Progress towards PT Goals Progress towards PT goals: Progressing toward goals  Plan Current plan remains appropriate    Precautions / Restrictions Precautions Precautions: Fall   Pertinent Vitals/Pain SaO2 85-88% on 3L with amb.    Mobility  Bed Mobility Bed Mobility: Sit to Supine Sit to Supine: 4: Min assist Transfers Sit to Stand: 4: Min assist;With upper extremity assist;With armrests;From bed;From chair/3-in-1 Stand to Sit: 4: Min assist;With upper extremity assist;With armrests;To bed;To chair/3-in-1 Details for Transfer Assistance: assist for balance Ambulation/Gait Ambulation/Gait Assistance: 4: Min assist (+1 for lines and to follow with chair.) Ambulation Distance (Feet): 80 Feet (80 x 1, 50' x 1) Assistive device: Rolling walker Ambulation/Gait Assistance Details: Verbal cues to stay closer to walker.  Pt continues to have lt knee buckling at times. Gait Pattern: Step-to pattern;Decreased stride length;Trunk flexed Gait velocity: decr    Exercises     PT Diagnosis:    PT Problem List:   PT Treatment Interventions:     PT Goals (current goals can now be found in the care plan section)    Visit Information  Last PT Received On:  08/23/12 Assistance Needed: +2 (lines/ follow with chair) History of Present Illness: Pt s/p appendectomy due to perforated appendix.  Pt also with COPD on home O2 as well as AICD.  Pt currently also having gout flare in lt knee and ankle.    Subjective Data      Cognition  Cognition Arousal/Alertness: Awake/alert Behavior During Therapy: WFL for tasks assessed/performed Overall Cognitive Status: Within Functional Limits for tasks assessed Memory: Decreased short-term memory    Balance  Static Standing Balance Static Standing - Balance Support: Bilateral upper extremity supported;During functional activity Static Standing - Level of Assistance: 4: Min assist  End of Session PT - End of Session Equipment Utilized During Treatment: Gait belt;Oxygen Activity Tolerance: Patient tolerated treatment well Patient left: in bed;with call bell/phone within reach Nurse Communication: Mobility status   GP     [facility] 08/23/2012, 3:42 PM  [facility] PT [phone]

## 2012-08-24 LAB — PROTIME-INR: Prothrombin Time: 20 seconds — ABNORMAL HIGH (ref 11.6–15.2)

## 2012-08-24 LAB — GLUCOSE, CAPILLARY
Glucose-Capillary: 322 mg/dL — ABNORMAL HIGH (ref 70–99)
Glucose-Capillary: 165 mg/dL — ABNORMAL HIGH (ref 70–99)

## 2012-08-24 MED ORDER — PREDNISONE 20 MG PO TABS: 20.0000 mg | ORAL_TABLET | Freq: Every day | ORAL | Status: AC

## 2012-08-24 MED ORDER — WARFARIN SODIUM 7.5 MG PO TABS
7.5000 mg | ORAL_TABLET | Freq: Once | ORAL | Status: DC
  Filled 2012-08-24: qty 1

## 2012-08-24 NOTE — Progress Notes (Signed)
ANTICOAGULATION CONSULT NOTE - Follow Up Consult  Pharmacy Consult for Warfarin Indication: Afib  Allergies  Allergen Reactions  . Adhesive (Tape)     Break out    Patient Measurements: Height: 5\' 7"  (170.2 cm) Weight: 209 lb 3.5 oz (94.9 kg) IBW/kg (Calculated) : 66.1  Vital Signs: Temp: 97.4 F (36.3 C) (08/05 1145) Temp src: Oral (08/05 1145) BP: 177/87 mmHg (08/05 1140) Pulse Rate: 61 (08/05 1140)  Labs:  Recent Labs  08/22/12 0528 08/23/12 0459 08/24/12 0430  LABPROT 16.8* 18.4* 20.0*  INR 1.40 1.58* 1.76*    Estimated Creatinine Clearance: 45.5 ml/min (by C-G formula based on Cr of 1.42).  Assessment: 82 yom continues on Coumadin for possible Afib per MD notes. INR (1.76<1.58) is subtherapeutic but is trending up since Coumadin restart on 8/2. No new CBC since 8/1. Last BMET on 8/2 with Scr 1.42. No overt bleeding noted.   Goal of Therapy:  INR 2-3   Plan:  DC Summary in place  If no DC today, will:  -Give warfarin 7.5 mg PO x 1 at 1800 -Daily PT/INR -Monitor for bleeding  Thank you for allowing me to take part in this patient's care,  Abran Duke, PharmD Clinical Pharmacist Phone: [phone] Pager: [phone] 08/24/2012 1:27 PM

## 2012-08-24 NOTE — Care Management Note (Signed)
    Page 1 of 1   08/24/2012     4:33:29 PM   CARE MANAGEMENT NOTE 08/24/2012  Patient:  Robert Davies, Robert Davies   Account Number:  [id number]  Date Initiated:  08/16/2012  Documentation initiated by:  Donn Pierini  Subjective/Objective Assessment:   Pt admitted with abd pain- appendicitis/colitis     Action/Plan:   PTA pt lived at home- has home 02-- NCM to follow pt progression for d/c needs   Anticipated DC Date:  08/24/2012   Anticipated DC Plan:  SKILLED NURSING FACILITY  In-house referral  Clinical Social Worker      DC Planning Services  CM consult      Choice offered to / List presented to:             Status of service:  Completed, signed off Medicare Important Message given?   (If response is "NO", the following Medicare IM given date fields will be blank) Date Medicare IM given:   Date Additional Medicare IM given:    Discharge Disposition:  SKILLED NURSING FACILITY  Per UR Regulation:  Reviewed for med. necessity/level of care/duration of stay  If discussed at Long Length of Stay Meetings, dates discussed:   08/24/2012    Comments:  08/24/12- 1000- Donn Pierini RN, BSN [phone] ileus resolved- pt ready for discharge- to d/c to SNF today.  08/20/12- 1000- Donn Pierini RN, BSN [phone] Ileus worse- NGT to be placed today- CSW following for SNF placement  08/18/12- 1500- Donn Pierini RN, BSN [phone] S/P lap appy for perforated appendicitis POD#2 - ileus, bowel rest and IVF-- d/c planning for SNF- CSW following

## 2012-08-24 NOTE — Progress Notes (Signed)
Pt d/cing with [facility] to [facility] - family at bedside

## 2012-08-24 NOTE — Progress Notes (Signed)
Agree with above 

## 2012-08-24 NOTE — Progress Notes (Signed)
CSW uploaded D/C summary and AVS to care[facility] for [facility] to review and contact CSW back for d/c planning.   CSW to follow for d/c to facility today.   Leron Croak, LCSWA [facility] Emergency Dept.  [phone]

## 2012-08-24 NOTE — Progress Notes (Signed)
CSW waiting on FL2 signature from MD.   [facility] faxed to MD at 12:13 pm.    CSW to follow for d/c planning.    Leron Croak, LCSWA [facility] Emergency Dept.  [phone]

## 2012-08-24 NOTE — Progress Notes (Signed)
Patient ID: Robert Davies, male   DOB: [birth year], 79 y.o.   MRN: [id number] 8 Days Post-Op  Subjective: Pt feels well today.  Eating well and having BMs  Objective: Vital signs in last 24 hours: Temp:  [97.3 F (36.3 C)-98.8 F (37.1 C)] 98.8 F (37.1 C) (08/05 0417) Pulse Rate:  [59-65] 65 (08/05 0702) Resp:  [22-24] 24 (08/05 0702) BP: (129-169)/(69-95) 129/69 mmHg (08/05 0702) SpO2:  [94 %-100 %] 98 % (08/05 0702) Weight:  [209 lb 3.5 oz (94.9 kg)] 209 lb 3.5 oz (94.9 kg) (08/05 0500) Last BM Date: 08/23/12  Intake/Output from previous day: 08/04 0701 - 08/05 0700 In: 200 [I.V.:200] Out: 1085 [Urine:1075; Drains:10] Intake/Output this shift:    PE: Abd: soft, appropriately tender, +BS, ND, incision c/d/i  Lab Results:  No results found for this basename: WBC, HGB, HCT, PLT,  in the last 72 hours BMET No results found for this basename: NA, K, CL, CO2, GLUCOSE, BUN, CREATININE, CALCIUM,  in the last 72 hours PT/INR  Recent Labs  08/23/12 0459 08/24/12 0430  LABPROT 18.4* 20.0*  INR 1.58* 1.76*   CMP     Component Value Date/Time   NA 136 08/21/2012 0425   K 3.7 08/21/2012 0425   CL 103 08/21/2012 0425   CO2 25 08/21/2012 0425   GLUCOSE 113* 08/21/2012 0425   BUN 19 08/21/2012 0425   CREATININE 1.42* 08/21/2012 0425   CALCIUM 8.3* 08/21/2012 0425   PROT 6.8 08/16/2012 0535   ALBUMIN 3.3* 08/16/2012 0535   AST 15 08/16/2012 0535   ALT 12 08/16/2012 0535   ALKPHOS 47 08/16/2012 0535   BILITOT 1.0 08/16/2012 0535   GFRNONAA 45* 08/21/2012 0425   GFRAA 52* 08/21/2012 0425   Lipase     Component Value Date/Time   LIPASE 36 08/14/2012 2035       Studies/Results: No results found.  Anti-infectives: Anti-infectives   Start     Dose/Rate Route Frequency Ordered Stop   08/23/12 1300  amoxicillin-clavulanate (AUGMENTIN) 875-125 MG per tablet 1 tablet     1 tablet Oral Every 12 hours 08/23/12 0840     08/23/12 1000  amoxicillin-clavulanate (AUGMENTIN) 875-125 MG per tablet 1  tablet  Status:  Discontinued     1 tablet Oral Every 12 hours 08/23/12 0835 08/23/12 0840   08/15/12 0337  piperacillin-tazobactam (ZOSYN) IVPB 3.375 g  Status:  Discontinued     3.375 g 12.5 mL/hr over 240 Minutes Intravenous 3 times per day 08/15/12 0337 08/23/12 0839       Assessment/Plan  1. S/p lap appy for perf appy Patient Active Problem List   Diagnosis Date Noted  . Abdominal pain 08/15/2012  . Preoperative cardiovascular examination 08/15/2012  . Preoperative respiratory examination 08/15/2012  . Biventricular implantable cardiac defibrillator- [facility] 08/14/2010  . 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 HYPERLIPIDEMIA 11/19/2009  . C V A / STROKE 11/19/2009  . EMPHYSEMA 11/19/2009  . ASTHMA 11/19/2009  . CHRONIC RESPIRATORY FAILURE 11/19/2009  . nonischemic cardiomyopathy  06/07/2008  . SYSTOLIC HEART FAILURE, CHRONIC 06/07/2008  . COPD 06/07/2008   Plan: 1. Patient is surgically stable for dc home.  Today will be 10 days of abx therapy.  I will stop his Augmentin after today. 2. He will need to follow up with [place] in 2-3 weeks and this will be arranged for him.  We will otherwise sign off.  Please call us prn.   LOS: 10 days    Sidnee Gambrill E  08/24/2012, 7:39 AM Pager: [phone]

## 2012-08-24 NOTE — Discharge Summary (Addendum)
Physician Discharge Summary  Patient ID: Robert Davies MRN: [id number] DOB/AGE: Jan 24, 1930 79 y.o.  Admit date: 08/14/2012 Discharge date: 08/24/2012  Admission Diagnoses: Abdominal pain Discharge Diagnoses:  Principal Problem:   Abdominal pain-s/p Appendectomy- perforated Appendix Ileus resolved, unable to have NG tube placed even under fluoroscopy. Based on prior  ultrasound demonstrating necrotic lymph nodes at the angle of the  mandible, neck CT should be considered to exclude an obstructing  nasopharyngeal mucosal lesion. Patient had no trouble tolerating by mouth intake.   COPD, oxygen dependent- CAD/ CHF/ AICD/  Low platlets-   Anemia- stable  BPH- with some obstuction- continue flomax-  HTN  DM-  CKD stage 3 cr stable baseline .Cr - at base 2.0  Gout, resume colchicine, low-dose prednisone will be added       Discharged Condition: stable  Hospital Course:   Patient presented to the hospital on July 26. With chief complaint of abdominal pain. Initial CT had evidence consistent with early appendicitis. Patient was started on IV fluids IV antibiotics. He was seen by surgery, ultimately cardiology because of his high cardiovascular risk. Patient had continued abdominal discomfort, followup CT was definitive for appendicitis. Patient was taken to the OR had appendectomy. Postop he was started on antibiotics. His postop course was complicated by an ileus. An attempt at NG tube was unsuccessful. Reglan was added, patient's ileus did resolve. He started having flatulence and began to have a bowel movement. He was started on a diet and tolerated it well. Patient's NG tube was attempted under fluoroscopy. There was concern that there was a necrotic lymph node at the angle of the mandible and recommended followup CT of the neck to rule out obstructing nasopharyngeal mucosal lesion. Upon stability this can be obtained however currently patient without any problems swallowing.  Patient's course was further complicated by pain in his knee, ankle and right great toe. Patient has a history of gout, he states his symptoms were consistent with that. He had been off his colchicine. Colchicine was resumed and a short course of prednisone was added. Patient's JP drain was removed, antibiotic courses been completed during this hospitalization for his ruptured appendicitis. Currently he's at his baseline level of function and is clear for discharge to skilled nursing facility. Outpatient medications will be resumed.  Consults: Treatment Team:  Bishop Limbo, MD  Cardiology Significant Diagnostic Studies:Ct Abdomen Pelvis Wo Contrast  08/16/2012   *RADIOLOGY REPORT*  Clinical Data: Nausea and periumbilical pain.  Abdominal pain. Fever.  Possible colitis.  CT ABDOMEN AND PELVIS WITHOUT CONTRAST  Technique:  Multidetector CT imaging of the abdomen and pelvis was performed following the standard protocol without intravenous contrast.  Comparison: CT of the abdomen and pelvis 08/15/2012.  Findings:  Lung Bases: Biventricular pacemaker device in place with leads terminating in the right ventricular apex, right atrium and overlying the lateral wall of the left ventricle via the coronary sinus and coronary veins.  Dependent atelectasis or scarring in the lower lobes of the lungs bilaterally (mild).  Abdomen/Pelvis:  High attenuation material layers dependently within the gallbladder, presumably vicarious excretion of iodinated contrast related to CT scan performed 08/15/2012.  No findings to suggest acute cholecystitis at this time.  The unenhanced appearance of the liver, spleen and bilateral adrenal glands is unremarkable.  There is extensive perinephric stranding bilaterally, which is a nonspecific finding that can be seen in the setting of renal insufficiency.  This is similar to the prior study.  A small exophytic low attenuation  right renal lesion measuring 1.2 cm in diameter in the interpolar region  is similar to the recent prior examination, likely to represent a small cyst. Other tiny left-sided renal lesions are not readily apparent on today's noncontrast CT examination (see report from 08/15/2012).  2 cm low attenuation lesion in the body of the pancreas is similar in size to the prior study.  The remainder the pancreas is otherwise unremarkable in appearance.  Extensive atherosclerosis throughout the abdominal and pelvic vasculature, including fusiform infrarenal abdominal aortic ectasia measuring up to 2.8 cm in diameter.  The appendix is now clearly dilated measuring 14-15 mm in diameter. Despite the presence of a large amount of oral contrast material within the cecum, none of the appendix fills with contrast, compatible with appendiceal obstruction.  Extensive periappendiceal inflammatory changes are now more evident than the prior study, compatible with acute appendicitis.  No definite periappendiceal fluid collection is noted at this time to suggest abscess or frank perforation.  No pneumoperitoneum.  No pathologic distension of small bowel.  No definite pathologic lymphadenopathy identified within the abdomen or pelvis on today's noncontrast CT examination. Suspected colonic wall thickening on the prior study is not evident on today's examination where the colon is more normally distended with contrast material.  Prostate gland and urinary bladder are unremarkable in appearance.  Musculoskeletal: There are no aggressive appearing lytic or blastic lesions noted in the visualized portions of the skeleton.  IMPRESSION: 1.  Today's examination now demonstrates clear evidence of acute appendicitis.  Surgical consultation is strongly recommended. 2.  Additional incidental findings, similar to the prior examination 08/15/2012, as detailed above.  Critical Value/emergent results were called by telephone at the time of interpretation on 08/16/2012 at 02:05 p.m. to Dr. Donette Larry, who verbally acknowledged these  results.   Original Report Authenticated By: Trudie Reed, M.D.   Dg Chest 1 View  08/14/2012   *RADIOLOGY REPORT*  Clinical Data: Epigastric abdominal pain and nausea.  CHEST - 1 VIEW  Comparison: 05/18/2012  Findings: Stable appearance of cardiac pacemaker since previous study.  Shallow inspiration. The heart size and pulmonary vascularity are normal. The lungs appear clear and expanded without focal air space disease or consolidation. No blunting of the costophrenic angles.  No pneumothorax.  Mediastinal contours appear intact.  IMPRESSION: No evidence of active pulmonary disease.   Original Report Authenticated By: Burman Nieves, M.D.   Dg Abd 1 View  08/20/2012   *RADIOLOGY REPORT*  Clinical Data: Follow up ileus  ABDOMEN - 1 VIEW  Comparison: Prior radiograph 08/18/2012  Findings: Increasing gaseous distension of multiple loops of small bowel in the mid abdomen.  There has been some interval distal progression of previously ingested oral contrast material which now extends to the rectum.  Stable position of surgical drain overlying the right lower quadrant.  No large free air on this supine radiograph.  IMPRESSION: Progression of small bowel distension throughout the midabdomen may represent  worsening ileus or developing obstruction.  Ileus is favored.  There has been some mild distal progression of previously ingested contrast material within the colon.   Original Report Authenticated By: Malachy Moan, M.D.   Dg Abd 1 View  08/18/2012   *RADIOLOGY REPORT*  Clinical Data: Post appendectomy, abdominal pain and distension, nausea, evaluate for ileus  ABDOMEN - 1 VIEW  Comparison: CT abdomen and pelvis - 08/16/2012  Findings: The previously ingested enteric contrast remains within the colon.  There is mild gaseous distension of several featureless loops of small bowel  with index loop in the left mid hemiabdomen measuring approximately 3.3 cm in diameter.  The descending colon also appears  slightly featureless, possibly secondary to under distension.  Nondiagnostic evaluation for pneumoperitoneum secondary supine position exclusion the lower thorax.  A surgical drain overlies the right lower abdominal quadrant. There is a minimal amount of excreted contrast within the urinary bladder.  No acute osseous abnormalities.  IMPRESSION: Findings most suggestive of mild postoperative ileus though developing small bowel obstruction may have a similar appearance. Radiographic follow up may be performed as clinically indicated.   Original Report Authenticated By: Tacey Ruiz, MD   US Abdomen Complete  08/14/2012   *RADIOLOGY REPORT*  Clinical Data:  Abdominal pain  COMPLETE ABDOMINAL ULTRASOUND  Comparison:  CT abdomen dated 02/11/2012  Findings:  Gallbladder:  No gallstones, gallbladder wall thickening, or pericholecystic fluid.  Negative sonographic Murphy's sign.  Common bile duct:  Measures 2 mm.  Liver:  Nodular hepatic contour with heterogeneous parenchymal echotexture, suggesting cirrhosis.  No focal lesion identified.  IVC:  Not visualized.  Pancreas:  Not visualized due to overlying bowel gas.  Spleen:  Measures 4 mm.  Right Kidney:  Measures 10.2 cm.  11 x 13 x 10 mm lateral interpolar cyst.  No hydronephrosis.  Left Kidney:  Measures 10.3 cm.  No mass or hydronephrosis.  Abdominal aorta:  Not visualized.  IMPRESSION: Possible cirrhosis.  13 mm right renal cyst.  Otherwise negative abdominal ultrasound.   Original Report Authenticated By: Charline Bills, M.D.   Ct Abdomen Pelvis W Contrast  08/15/2012   *RADIOLOGY REPORT*  Clinical Data: Mid abdominal pain after eating chicken. Constipation.  CT ABDOMEN AND PELVIS WITH CONTRAST  Technique:  Multidetector CT imaging of the abdomen and pelvis was performed following the standard protocol during bolus administration of intravenous contrast.  Contrast: OMNIPAQUE IOHEXOL 300 MG/ML  SOLN  Comparison: 02/11/2012  Findings: Mild dependent  atelectasis in the lung bases.  The liver, spleen, gallbladder, adrenal glands, inferior vena cava, and retroperitoneal lymph nodes are unremarkable.  Calcification and thrombus in the abdominal aorta without aneurysm.  Small sub centimeter cysts in the kidneys appear stable.  No solid mass or hydronephrosis is identified.  Simple appearing cystic lesion in the body of the pancreas measuring 2 cm diameter.  This is stable in size and appearance on studies dating back to 04/25/2011.  6- month follow-up CT or MRI is recommended to confirm stability.  The gastric wall is not thickened.  Small bowel are not distended. Colon is mostly decompressed.  The right colon is fluid-filled and may demonstrate mild wall thickening although this could be due to incomplete distension.  There is no pericolonic infiltration.  Mild colitis is not excluded. No free air or free fluid in the abdomen. Abdominal wall musculature appears intact.  Pelvis: Calcification in the prostate gland.  Bladder wall is not thickened.  No free or loculated pelvic fluid collections. Rectosigmoid colon is decompressed without evidence of diverticulitis.  The appendix is fluid-filled and demonstrates borderline caliber at 8-11 mm diameter.  There is a small appendicolith at the base.  Minimal infiltration around the appendix.  Changes are nonspecific but could reflect early acute appendicitis.  No evidence of rupture or abscess.  No significant pelvic lymphadenopathy.  Normal alignment of the lumbar spine with mild degenerative changes.  IMPRESSION:  1.  Stable 2 cm diameter cyst in the body of the pancreas.  58-month follow-up is recommended. 2.  Mild thickening of the right  colon wall versus under distension.  Mild colitis is not excluded. 3.  Mildly distended fluid filled appendix with minimal inflammatory stranding.  Changes are nonspecific but suggest early acute appendicitis.  Correlation with physical examination and laboratory values is recommended.    Original Report Authenticated By: Burman Nieves, M.D.   Dg Fluoro Rm 1-60 Min  08/20/2012   *RADIOLOGY REPORT*  Clinical Data:Inability to place a nasogastric tube at the bedside. Placement under fluoroscopy requested.  FLOURO RM 1-60 MIN  Fluoroscopy Time: 1 minute 16 seconds  Comparison: Portable chest 08/16/2012.  Neck ultrasound 02/19/2012.  Findings: Attempt to place a nasogastric tube by the radiology technologist were unsuccessful.  I was asked to assist. I tried using both nostrils with the patient in the supine and right lateral decubitus positions.  In the lateral position, fluoroscopy was used to assist guidance of the nasogastric tube.  The tube could not be advanced beyond the nasopharynx. The patient requested that additional times not be made.  IMPRESSION: Unsuccessful nasogastric tube placement.  The tube could not be advanced beyond the posterior nasopharynx. Based on prior ultrasound demonstrating necrotic lymph nodes at the angle of the mandible, neck CT should be considered to exclude an obstructing nasopharyngeal mucosal lesion.   Original Report Authenticated By: Carey Bullocks, M.D.   Dg Chest Port 1 View  08/16/2012   *RADIOLOGY REPORT*  Clinical Data: Feeding.  PORTABLE CHEST - 1 VIEW  Comparison: 08/14/2012 chest radiograph.  Findings: Lung volumes are lower than on prior.  There is pulmonary vascular congestion and fullness of the right hilum.  Basilar atelectasis.  There is no airspace disease/edema.  Three lead right subclavian pacemaker is present with coronary sinus lead.  No focal airspace consolidation.  No pneumothorax.  Cardiopericardial silhouette appears within normal limits allowing for lower volumes of inspiration. Monitoring leads are projected over the chest.  IMPRESSION: Lower lung volumes with pulmonary vascular congestion and basilar atelectasis.   Original Report Authenticated By: Andreas Newport, M.D.   2-D echo Left ventricle: Poorly visualized. The cavity  size was normal. There was mild concentric hypertrophy. Systolic function was moderately reduced. The estimated ejection fraction was in the range of 35% to 40%. Gross estimation due to poor images. Images were inadequate for LV wall motion assessment. Doppler parameters are consistent with abnormal left ventricular relaxation (grade 1 diastolic dysfunction).     Discharge Exam: Blood pressure 129/69, pulse 75, temperature 98 F (36.7 C), temperature source Oral, resp. rate 25, height 5\' 7"  (1.702 m), weight 94.9 kg (209 lb 3.5 oz), SpO2 95.00%. General appearance: alert and cooperative Resp: clear to auscultation bilaterally Cardio: regular rate and rhythm, S1, S2 normal, no murmur, click, rub or gallop GI: abdomen soft, positive bowel sounds, there are some areas of ecchymosis that are resolving. Extremities: extremities normal, atraumatic, no cyanosis or edema  Disposition: skilled nursing facility   Future Appointments Provider Department Dept Phone   09/07/2012 1:00 PM [facility], [place] [phone]   09/28/2012 12:15 PM Marinus Maw, MD [facility] Main Office [place]) [phone]       Medication List    STOP taking these medications       varenicline 0.5 MG tablet  Commonly known as:  CHANTIX      TAKE these medications       ADVAIR DISKUS 250-50 MCG/DOSE Aepb  Generic drug:  Fluticasone-Salmeterol  Inhale 1 puff into the lungs 2 (two) times daily.  albuterol 1.25 MG/3ML nebulizer solution  Commonly known as:  ACCUNEB  Take 1 ampule by nebulization 2 (two) times daily.     bisacodyl 5 MG EC tablet  Generic drug:  bisacodyl  Take 10 mg by mouth at bedtime.     colchicine 0.6 MG tablet  Take 0.6 mg by mouth daily.     COSOPT OP  Apply 1 drop to eye 2 (two) times daily.     enalapril 10 MG tablet  Commonly known as:  VASOTEC  Take 1 tablet (10 mg total) by mouth daily.     famotidine 20 MG tablet  Commonly  known as:  PEPCID  Take 20 mg by mouth 2 (two) times daily.     FLOMAX 0.4 MG Caps capsule  Generic drug:  tamsulosin  Take 0.4 mg by mouth daily.     fluticasone 50 MCG/ACT nasal spray  Commonly known as:  FLONASE  Place 2 sprays into the nose daily.     furosemide 40 MG tablet  Commonly known as:  LASIX  Take 40 mg by mouth daily.     glimepiride 2 MG tablet  Commonly known as:  AMARYL  Take 2 mg by mouth 2 (two) times daily.     metoprolol succinate 50 MG 24 hr tablet  Commonly known as:  TOPROL-XL  Take 50 mg by mouth daily.     pantoprazole 40 MG tablet  Commonly known as:  PROTONIX  Take 40 mg by mouth daily.     polyethylene glycol packet  Commonly known as:  MIRALAX / GLYCOLAX  Take 17 g by mouth daily as needed (for constipation).     potassium chloride 10 MEQ CR tablet  Commonly known as:  KLOR-CON  Take 10 mEq by mouth 2 (two) times daily.     predniSONE 20 MG tablet  Commonly known as:  DELTASONE  Take 1 tablet (20 mg total) by mouth daily with breakfast.     simvastatin 20 MG tablet  Commonly known as:  ZOCOR  Take 20 mg by mouth at bedtime.     sitaGLIPtin 50 MG tablet  Commonly known as:  JANUVIA  Take 50 mg by mouth daily.     tiotropium 18 MCG inhalation capsule  Commonly known as:  SPIRIVA  Place 18 mcg into inhaler and inhale daily.     warfarin 5 MG tablet  Commonly known as:  COUMADIN  Take 5 mg by mouth daily.           Follow-up Information   Follow up with [facility] On 09/07/2012. (1:00pm, arrive no later than 12:30pm)    Contact information:   [address]   [place] [address] [phone]      35 minutes were spent in the discharge of this patient. Chart review, medication reconciliation. SignedKaty Apo 08/24/2012, 8:47 AM

## 2012-08-24 NOTE — Progress Notes (Signed)
CSW spoke with facility [facility] [place]) and Pt is cleared to transport to facility. CSW notified nursing staff and family.   Pt will transport via [facility].   Leron Croak, LCSWA [facility] Emergency Dept.  [phone]

## 2012-08-24 NOTE — Progress Notes (Signed)
Attempted to get patient oob  To walk and sit in chair pt said" I will get up later Im to tired now" explained to patient importance of getting OOB pt said he will later.

## 2012-08-25 LAB — GLUCOSE, CAPILLARY: Glucose-Capillary: 239 mg/dL — ABNORMAL HIGH (ref 70–99)

## 2012-08-26 ENCOUNTER — Non-Acute Institutional Stay (SKILLED_NURSING_FACILITY): Payer: Medicare Other | Admitting: Internal Medicine

## 2012-08-26 DIAGNOSIS — K219 Gastro-esophageal reflux disease without esophagitis: Secondary | ICD-10-CM

## 2012-08-26 DIAGNOSIS — N183 Chronic kidney disease, stage 3 (moderate): Secondary | ICD-10-CM

## 2012-08-26 DIAGNOSIS — J449 Chronic obstructive pulmonary disease, unspecified: Secondary | ICD-10-CM

## 2012-08-26 DIAGNOSIS — R5381 Other malaise: Secondary | ICD-10-CM

## 2012-08-26 DIAGNOSIS — Z9889 Other specified postprocedural states: Secondary | ICD-10-CM

## 2012-08-26 DIAGNOSIS — I5022 Chronic systolic (congestive) heart failure: Secondary | ICD-10-CM

## 2012-08-26 DIAGNOSIS — Z9049 Acquired absence of other specified parts of digestive tract: Secondary | ICD-10-CM | POA: Insufficient documentation

## 2012-08-26 DIAGNOSIS — K567 Ileus, unspecified: Secondary | ICD-10-CM

## 2012-08-26 DIAGNOSIS — E785 Hyperlipidemia, unspecified: Secondary | ICD-10-CM

## 2012-08-26 DIAGNOSIS — K929 Disease of digestive system, unspecified: Secondary | ICD-10-CM

## 2012-08-26 DIAGNOSIS — K560 Paralytic ileus: Secondary | ICD-10-CM

## 2012-08-26 DIAGNOSIS — N189 Chronic kidney disease, unspecified: Secondary | ICD-10-CM | POA: Insufficient documentation

## 2012-08-26 DIAGNOSIS — R531 Weakness: Secondary | ICD-10-CM

## 2012-08-26 DIAGNOSIS — E119 Type 2 diabetes mellitus without complications: Secondary | ICD-10-CM | POA: Insufficient documentation

## 2012-08-26 NOTE — Progress Notes (Signed)
Patient ID: Robert Davies, male   DOB: 1930/11/16, 79 y.o.   MRN: [id number]  [facility]    PCP: Georgann Housekeeper, MD  Code Status: full code  Allergies  Allergen Reactions  . Adhesive (Tape)     Break out    Chief Complaint  Patient presents with  . Hospitalization Follow-up    HPI:  79 y.o male patient was admitted to the hospital with abdominal pain. He had ruptured appendicitis and was started on IV fluids IV antibiotics. Surgery and cardiology team were consulted. And he underwent appendectomy. Following this he developed ileus and responded well to reglan. He also had acute gout flare and responded to pain management with course of steroid. He was able to tolerate po and his diet was advanced. Given his weakness, he was sent to SNF for STR He was seen in his room today. He denies any joint pain. His breathing is comfortable with o2, he works with therapy team. He has regular bowel movement. No other concerns  Review of Systems  Constitutional: Negative for fever and chills.  HENT: Negative for congestion and sore throat.   Eyes: Negative for blurred vision.  Respiratory: Negative for cough and shortness of breath.   Cardiovascular: Negative for chest pain.  Gastrointestinal: Negative for heartburn, nausea, vomiting and abdominal pain.  Genitourinary: Negative for dysuria and flank pain.  Musculoskeletal: Negative for myalgias and falls.  Skin: Negative for rash.  Neurological: Positive for weakness. Negative for dizziness, seizures, loss of consciousness and headaches.  Psychiatric/Behavioral: Negative for depression and memory loss. The patient is not nervous/anxious.      Past Medical History  Diagnosis Date  . Emphysema     Oxygen-dependent  . Chronic systolic heart failure   . Implantable cardiac defibrillator infection     July 2012  . ICD (implantable cardiac defibrillator) in place     CRT St Jude; remote - yes  . Nonischemic cardiomyopathy     a.  Catheterization 2005 no obstructive coronary disease;  b. 02/2009 Echo: EF 35-40%, Gr 2 DD, mild lvh.  Marland Kitchen HLD (hyperlipidemia)   . Essential hypertension, benign   . Myocardial infarction 1990's  . Peptic ulcer   . GI bleeding   . BPH (benign prostatic hypertrophy)   . DM (diabetes mellitus)   . Coronary atherosclerosis of native coronary artery     Nonobstructive  . Shortness of breath   . Automatic implantable cardioverter-defibrillator in situ   . CHF (congestive heart failure)   . Chronic kidney disease    Past Surgical History  Procedure Laterality Date  . Adenoidectomy    . Doppler echocardiography  2005, 2011  . Cardiac defibrillator placement  11/27/10    "this is my 3rd defibrillator"  . Cervical disc surgery  1990's    "slipped"  . Laceration repair  ~ 1939    right leg  . Tonsillectomy  ~ 1940  . Insert / replace / remove pacemaker  11/27/10    "this is my third one"  . Eye surgery    . Cataract extraction      "left eye"  . Laparoscopic appendectomy N/A 08/16/2012    Procedure: APPENDECTOMY LAPAROSCOPIC;  Surgeon: Liz Malady, MD;  Location: [facility] OR;  Service: General;  Laterality: N/A;  . Appendectomy  08/16/2012   Social History:   reports that he quit smoking about 14 months ago. His smoking use included Cigarettes. He smoked 2.00 packs per day. He has never used smokeless  tobacco. He reports that he does not drink alcohol or use illicit drugs.  Family History  Problem Relation Age of Onset  . Cancer Father   . Cancer Mother     Throat  . Lung cancer Daughter     Medications: Patient's Medications  New Prescriptions   No medications on file  Previous Medications   ALBUTEROL (ACCUNEB) 1.25 MG/3ML NEBULIZER SOLUTION    Take 1 ampule by nebulization 2 (two) times daily.    BISACODYL (BISACODYL) 5 MG EC TABLET    Take 10 mg by mouth at bedtime.   COLCHICINE 0.6 MG TABLET    Take 0.6 mg by mouth daily.   DORZOLAMIDE HCL-TIMOLOL MAL (COSOPT OP)    Apply 1  drop to eye 2 (two) times daily.    ENALAPRIL (VASOTEC) 10 MG TABLET    Take 1 tablet (10 mg total) by mouth daily.   FAMOTIDINE (PEPCID) 20 MG TABLET    Take 20 mg by mouth 2 (two) times daily.    FLUTICASONE (FLONASE) 50 MCG/ACT NASAL SPRAY    Place 2 sprays into the nose daily.   FLUTICASONE-SALMETEROL (ADVAIR DISKUS) 250-50 MCG/DOSE AEPB    Inhale 1 puff into the lungs 2 (two) times daily.     FUROSEMIDE (LASIX) 40 MG TABLET    Take 40 mg by mouth daily.   GLIMEPIRIDE (AMARYL) 2 MG TABLET    Take 2 mg by mouth 2 (two) times daily.     METOPROLOL (TOPROL-XL) 50 MG 24 HR TABLET    Take 50 mg by mouth daily.     PANTOPRAZOLE (PROTONIX) 40 MG TABLET    Take 40 mg by mouth daily.   POLYETHYLENE GLYCOL (MIRALAX / GLYCOLAX) PACKET    Take 17 g by mouth daily as needed (for constipation).   POTASSIUM CHLORIDE (KLOR-CON) 10 MEQ CR TABLET    Take 10 mEq by mouth 2 (two) times daily.     PREDNISONE (DELTASONE) 20 MG TABLET    Take 1 tablet (20 mg total) by mouth daily with breakfast.   SIMVASTATIN (ZOCOR) 20 MG TABLET    Take 20 mg by mouth at bedtime.     SITAGLIPTIN (JANUVIA) 50 MG TABLET    Take 50 mg by mouth daily.   TAMSULOSIN HCL (FLOMAX) 0.4 MG CAPS    Take 0.4 mg by mouth daily.     TIOTROPIUM (SPIRIVA) 18 MCG INHALATION CAPSULE    Place 18 mcg into inhaler and inhale daily.   WARFARIN (COUMADIN) 5 MG TABLET    Take 5 mg by mouth daily.   Modified Medications   No medications on file  Discontinued Medications   No medications on file     Physical Exam: Filed Vitals:   08/26/12 1735  BP: 140/63  Pulse: 60  Temp: 98.2 F (36.8 C)  Resp: 18  Height: 5\' 7"  (1.702 m)  Weight: 210 lb (95.255 kg)  SpO2: 94%   General: alert and cooperative, obese, in NAD HEENT- no plalor, no icterus, no JVD, MMM Resp: clear to auscultation bilaterally, no wheeze or rhonchi Cardio: regular rate and rhythm, S1, S2 normal, no murmur, click, rub or gallop GI: abdomen soft, positive bowel sounds, there  are some areas of ecchymosis that are resolving.incision site healing well Extremities: trace edema, able to move all 4 Neuro- aao x 3, no focal deficit   Labs reviewed: Basic Metabolic Panel:  Recent Labs  21/30/86 0445 08/19/12 0424 08/21/12 0425  NA 135 136 136  K 4.1 3.7 3.7  CL 98 102 103  CO2 27 27 25   GLUCOSE 132* 131* 113*  BUN 27* 28* 19  CREATININE 2.12* 1.90* 1.42*  CALCIUM 8.5 8.2* 8.3*   Liver Function Tests:  Recent Labs  08/14/12 2035 08/15/12 0955 08/16/12 0535  AST 21 18 15   ALT 17 15 12   ALKPHOS 46 45 47  BILITOT 0.5 0.8 1.0  PROT 7.9 6.8 6.8  ALBUMIN 4.3 3.5 3.3*    Recent Labs  08/14/12 2035  LIPASE 36   No results found for this basename: AMMONIA,  in the last 8760 hours CBC:  Recent Labs  08/14/12 2035 08/15/12 0955 08/16/12 0535  08/18/12 0445 08/19/12 0424 08/20/12 0420  WBC 14.1* 13.4* 12.7*  < > 11.9* 8.9 6.5  NEUTROABS 11.5* 11.7* 10.4*  --   --   --   --   HGB 13.1 11.6* 11.6*  < > 12.7* 10.3* 10.2*  HCT 38.4* 36.4* 35.5*  < > 37.7* 31.0* 31.4*  MCV 83.5 85.8 86.4  < > 85.5 84.5 85.6  PLT 135* 121* 107*  < > 87* 137* 151  < > = values in this interval not displayed.  CBG:  Recent Labs  08/23/12 2129 08/24/12 0755 08/24/12 1142  GLUCAP 248* 165* 239*    Radiological Exams:  Significant Diagnostic Studies:Ct Abdomen Pelvis Wo Contrast  08/16/2012   *RADIOLOGY REPORT*  Clinical Data: Nausea and periumbilical pain.  Abdominal pain. Fever.  Possible colitis.  CT ABDOMEN AND PELVIS WITHOUT CONTRAST  Technique:  Multidetector CT imaging of the abdomen and pelvis was performed following the standard protocol without intravenous contrast.  Comparison: CT of the abdomen and pelvis 08/15/2012. Findings:  Lung Bases: Biventricular pacemaker device in place with leads terminating in the right ventricular apex, right atrium and overlying the lateral wall of the left ventricle via the coronary sinus and coronary veins.  Dependent  atelectasis or scarring in the lower lobes of the lungs bilaterally (mild). Abdomen/Pelvis:  High attenuation material layers dependently within the gallbladder, presumably vicarious excretion of iodinated contrast related to CT scan performed 08/15/2012.  No findings to suggest acute cholecystitis at this time.  The unenhanced appearance of the liver, spleen and bilateral adrenal glands is unremarkable.  There is extensive perinephric stranding bilaterally, which is a nonspecific finding that can be seen in the setting of renal insufficiency.  This is similar to the prior study.  A small exophytic low attenuation right renal lesion measuring 1.2 cm in diameter in the interpolar region is similar to the recent prior examination, likely to represent a small cyst. Other tiny left-sided renal lesions are not readily apparent on today's noncontrast CT examination (see report from 08/15/2012).  2 cm low attenuation lesion in the body of the pancreas is similar in size to the prior study.  The remainder the pancreas is otherwise unremarkable in appearance.  Extensive atherosclerosis throughout the abdominal and pelvic vasculature, including fusiform infrarenal abdominal aortic ectasia measuring up to 2.8 cm in diameter.  The appendix is now clearly dilated measuring 14-15 mm in diameter. Despite the presence of a large amount of oral contrast material within the cecum, none of the appendix fills with contrast, compatible with appendiceal obstruction.  Extensive periappendiceal inflammatory changes are now more evident than the prior study, compatible with acute appendicitis.  No definite periappendiceal fluid collection is noted at this time to suggest abscess or frank perforation.  No pneumoperitoneum.  No pathologic distension of small bowel.  No definite pathologic lymphadenopathy identified within the abdomen or pelvis on today's noncontrast CT examination. Suspected colonic wall thickening on the prior study is not  evident on today's examination where the colon is more normally distended with contrast material.  Prostate gland and urinary bladder are unremarkable in appearance.  Musculoskeletal: There are no aggressive appearing lytic or blastic lesions noted in the visualized portions of the skeleton.  IMPRESSION: 1.  Today's examination now demonstrates clear evidence of acute appendicitis.  Surgical consultation is strongly recommended. 2.  Additional incidental findings, similar to the prior examination 08/15/2012, as detailed above.  Critical Value/emergent results were called by telephone at the time of interpretation on 08/16/2012 at 02:05 p.m. to Dr. Donette Larry, who verbally acknowledged these results.   Original Report Authenticated By: Trudie Reed, M.D.   Dg Chest 1 View  08/14/2012   *RADIOLOGY REPORT*  Clinical Data: Epigastric abdominal pain and nausea.  CHEST - 1 VIEW  Comparison: 05/18/2012  Findings: Stable appearance of cardiac pacemaker since previous study.  Shallow inspiration. The heart size and pulmonary vascularity are normal. The lungs appear clear and expanded without focal air space disease or consolidation. No blunting of the costophrenic angles.  No pneumothorax.  Mediastinal contours appear intact.  IMPRESSION: No evidence of active pulmonary disease.   Original Report Authenticated By: Burman Nieves, M.D.   Dg Abd 1 View  08/20/2012   *RADIOLOGY REPORT*  Clinical Data: Follow up ileus  ABDOMEN - 1 VIEW  Comparison: Prior radiograph 08/18/2012  Findings: Increasing gaseous distension of multiple loops of small bowel in the mid abdomen.  There has been some interval distal progression of previously ingested oral contrast material which now extends to the rectum.  Stable position of surgical drain overlying the right lower quadrant.  No large free air on this supine radiograph.  IMPRESSION: Progression of small bowel distension throughout the midabdomen may represent  worsening ileus or  developing obstruction.  Ileus is favored.  There has been some mild distal progression of previously ingested contrast material within the colon.   Original Report Authenticated By: Malachy Moan, M.D.   Dg Abd 1 View  08/18/2012   *RADIOLOGY REPORT*  Clinical Data: Post appendectomy, abdominal pain and distension, nausea, evaluate for ileus  ABDOMEN - 1 VIEW  Comparison: CT abdomen and pelvis - 08/16/2012  Findings: The previously ingested enteric contrast remains within the colon.  There is mild gaseous distension of several featureless loops of small bowel with index loop in the left mid hemiabdomen measuring approximately 3.3 cm in diameter.  The descending colon also appears slightly featureless, possibly secondary to under distension.  Nondiagnostic evaluation for pneumoperitoneum secondary supine position exclusion the lower thorax.  A surgical drain overlies the right lower abdominal quadrant. There is a minimal amount of excreted contrast within the urinary bladder.  No acute osseous abnormalities.  IMPRESSION: Findings most suggestive of mild postoperative ileus though developing small bowel obstruction may have a similar appearance. Radiographic follow up may be performed as clinically indicated.   Original Report Authenticated By: Tacey Ruiz, MD   US Abdomen Complete  08/14/2012   *RADIOLOGY REPORT*  Clinical Data:  Abdominal pain  COMPLETE ABDOMINAL ULTRASOUND  Comparison:  CT abdomen dated 02/11/2012  Findings:  Gallbladder:  No gallstones, gallbladder wall thickening, or pericholecystic fluid.  Negative sonographic Murphy's sign.  Common bile duct:  Measures 2 mm.  Liver:  Nodular hepatic contour with heterogeneous parenchymal echotexture, suggesting cirrhosis.  No focal lesion identified. IVC:  Not visualized.  Pancreas:  Not visualized due to overlying bowel gas.  Spleen:  Measures 4 mm.  Right Kidney:  Measures 10.2 cm.  11 x 13 x 10 mm lateral interpolar cyst.  No hydronephrosis.  Left  Kidney:  Measures 10.3 cm.  No mass or hydronephrosis.  Abdominal aorta:  Not visualized.  IMPRESSION: Possible cirrhosis.  13 mm right renal cyst.  Otherwise negative abdominal ultrasound.   Original Report Authenticated By: Charline Bills, M.D.   Ct Abdomen Pelvis W Contrast  08/15/2012   *RADIOLOGY REPORT*  Clinical Data: Mid abdominal pain after eating chicken. Constipation.  CT ABDOMEN AND PELVIS WITH CONTRAST  Technique:  Multidetector CT imaging of the abdomen and pelvis was performed following the standard protocol during bolus administration of intravenous contrast.  Contrast: OMNIPAQUE IOHEXOL 300 MG/ML  SOLN  Comparison: 02/11/2012  Findings: Mild dependent atelectasis in the lung bases.  The liver, spleen, gallbladder, adrenal glands, inferior vena cava, and retroperitoneal lymph nodes are unremarkable.  Calcification and thrombus in the abdominal aorta without aneurysm.  Small sub centimeter cysts in the kidneys appear stable.  No solid mass or hydronephrosis is identified.  Simple appearing cystic lesion in the body of the pancreas measuring 2 cm diameter.  This is stable in size and appearance on studies dating back to 04/25/2011.  6- month follow-up CT or MRI is recommended to confirm stability.  The gastric wall is not thickened.  Small bowel are not distended. Colon is mostly decompressed.  The right colon is fluid-filled and may demonstrate mild wall thickening although this could be due to incomplete distension.  There is no pericolonic infiltration.  Mild colitis is not excluded. No free air or free fluid in the abdomen. Abdominal wall musculature appears intact.  Pelvis: Calcification in the prostate gland.  Bladder wall is not thickened.  No free or loculated pelvic fluid collections. Rectosigmoid colon is decompressed without evidence of diverticulitis.  The appendix is fluid-filled and demonstrates borderline caliber at 8-11 mm diameter.  There is a small appendicolith at the  base.  Minimal infiltration around the appendix.  Changes are nonspecific but could reflect early acute appendicitis.  No evidence of rupture or abscess.  No significant pelvic lymphadenopathy.  Normal alignment of the lumbar spine with mild degenerative changes.  IMPRESSION:  1.  Stable 2 cm diameter cyst in the body of the pancreas.  25-month follow-up is recommended. 2.  Mild thickening of the right colon wall versus under distension.  Mild colitis is not excluded. 3.  Mildly distended fluid filled appendix with minimal inflammatory stranding.  Changes are nonspecific but suggest early acute appendicitis.  Correlation with physical examination and laboratory values is recommended.   Original Report Authenticated By: Burman Nieves, M.D.   Dg Fluoro Rm 1-60 Min  08/20/2012   *RADIOLOGY REPORT*  Clinical Data:Inability to place a nasogastric tube at the bedside. Placement under fluoroscopy requested.  FLOURO RM 1-60 MIN  Fluoroscopy Time: 1 minute 16 seconds  Comparison: Portable chest 08/16/2012.  Neck ultrasound 02/19/2012.  Findings: Attempt to place a nasogastric tube by the radiology technologist were unsuccessful.  I was asked to assist. I tried using both nostrils with the patient in the supine and right lateral decubitus positions.  In the lateral position, fluoroscopy was used to assist guidance of the nasogastric tube.  The tube could not be advanced beyond the nasopharynx. The patient requested that additional times not be made.  IMPRESSION: Unsuccessful nasogastric tube placement.  The tube  could not be advanced beyond the posterior nasopharynx. Based on prior ultrasound demonstrating necrotic lymph nodes at the angle of the mandible, neck CT should be considered to exclude an obstructing nasopharyngeal mucosal lesion.   Original Report Authenticated By: Carey Bullocks, M.D.   Dg Chest Port 1 View  08/16/2012   *RADIOLOGY REPORT*  Clinical Data: Feeding.  PORTABLE CHEST - 1 VIEW  Comparison:  08/14/2012 chest radiograph.  Findings: Lung volumes are lower than on prior.  There is pulmonary vascular congestion and fullness of the right hilum.  Basilar atelectasis.  There is no airspace disease/edema.  Three lead right subclavian pacemaker is present with coronary sinus lead.  No focal airspace consolidation.  No pneumothorax.  Cardiopericardial silhouette appears within normal limits allowing for lower volumes of inspiration. Monitoring leads are projected over the chest.  IMPRESSION: Lower lung volumes with pulmonary vascular congestion and basilar atelectasis.   Original Report Authenticated By: Andreas Newport, M.D.   2-D echo Left ventricle: Poorly visualized. The cavity size was normal. There was mild concentric hypertrophy. Systolic function was moderately reduced. The estimated ejection fraction was in the range of 35% to 40%. Gross estimation due to poor images. Images were inadequate for LV wall motion assessment. Doppler parameters are consistent with abnormal left ventricular relaxation (grade 1 diastolic dysfunction).   Assessment/Plan  Appendicitis- s/p appendectomy. Tolerated this well. Here to undergo rehab with pt and OT team. Encouraged participation  Generalized weakness- s/p recent surgery and its complications along with his cardiac comorbidities. Will have him work with therapy tea for muscle group strengthening and gait stability  Copd- continue o2 by nasal canula, albuterol, advair and spiriva  Gout- no recent flare. Check uric acid level. continue colchicine and prednsione and will stop prednisone in 1 week. Consider adding allopurinol  Glaucoma- continue the eye drops  htn- bp stable.  Continue enalapril, toprol, furosemide, monitor  gerd- continue famotidine and protonix  Dm- continue glimeperide and januvia, monitor cbg. On statin   chf- monitor weight. Continue lasix, b blocker, ACEI. Is s/p cardiac defibrillator placement. Currently  stable  Hyperlipidemia- continue zocor  afib- continue b blocker for rate control and coumadin for stroke prophylaxis. Monitor inr  Ileus- resolved, continue stool softener for now   Family/ staff Communication: reviewed care plan with patient and nursing supervisor   Goals of care: return home   Labs/tests ordered- cbc, cmp, uric acid

## 2012-08-31 ENCOUNTER — Encounter: Payer: Self-pay | Admitting: Internal Medicine

## 2012-08-31 ENCOUNTER — Non-Acute Institutional Stay (SKILLED_NURSING_FACILITY): Payer: Medicare Other | Admitting: Internal Medicine

## 2012-08-31 DIAGNOSIS — M109 Gout, unspecified: Secondary | ICD-10-CM

## 2012-08-31 DIAGNOSIS — J4489 Other specified chronic obstructive pulmonary disease: Secondary | ICD-10-CM

## 2012-08-31 DIAGNOSIS — Z9049 Acquired absence of other specified parts of digestive tract: Secondary | ICD-10-CM

## 2012-08-31 DIAGNOSIS — R5381 Other malaise: Secondary | ICD-10-CM

## 2012-08-31 DIAGNOSIS — R531 Weakness: Secondary | ICD-10-CM

## 2012-08-31 DIAGNOSIS — R5383 Other fatigue: Secondary | ICD-10-CM

## 2012-08-31 DIAGNOSIS — Z9889 Other specified postprocedural states: Secondary | ICD-10-CM

## 2012-08-31 DIAGNOSIS — J449 Chronic obstructive pulmonary disease, unspecified: Secondary | ICD-10-CM

## 2012-08-31 NOTE — Progress Notes (Signed)
Patient ID: Robert Davies, male   DOB: Nov 05, 1930, 79 y.o.   MRN: [id number] Location:  [facility] Robert Davies, D.O., C.M.D.  PCP: Robert Housekeeper, MD  Code Status: full code  Allergies  Allergen Reactions  . Adhesive (Tape)     Break out    Chief Complaint  Patient presents with  . Discharge Note    HPI: 79 yo male with oxygen-dependent COPD, chronic systolic heart failure with AICD, HTN, hyperlipidemia, DMII, BPH, and peptic ulcer with GI bleeding here for short term rehab s/p hospitalization with ruptured appendicitis.  He received PT and OT and now will return home with home health services.  He needs f/u of his INR as he his on coumadin for paroxysmal afib.   While here he was treated for a gout flare in his right great toe with a one week course of prednisone (finishes on 8/15) and remains on colcrys.  His uric acid level was less than 6.  He also had pain in his left ankle and knee at that time.  I question whether he did not have pseudogout.  He has responded well to the steroids  He feels well and is ready for discharge home.  Review of Systems:  Review of Systems  Constitutional: Negative for fever.  HENT: Negative for congestion.   Respiratory: Positive for shortness of breath. Negative for cough.   Cardiovascular: Negative for chest pain and leg swelling.  Gastrointestinal: Negative for abdominal pain, diarrhea, constipation, blood in stool and melena.  Genitourinary: Negative for dysuria.  Musculoskeletal: Positive for joint pain. Negative for myalgias.  Skin: Negative for rash.  Neurological: Negative for dizziness, focal weakness and weakness.  Psychiatric/Behavioral: Negative for depression.     Past Medical History  Diagnosis Date  . Emphysema     Oxygen-dependent  . Chronic systolic heart failure   . Implantable cardiac defibrillator infection     July 2012  . ICD (implantable cardiac defibrillator) in place     CRT St Jude; remote -  yes  . Nonischemic cardiomyopathy     a. Catheterization 2005 no obstructive coronary disease;  b. 02/2009 Echo: EF 35-40%, Gr 2 DD, mild lvh.  Marland Kitchen HLD (hyperlipidemia)   . Essential hypertension, benign   . Myocardial infarction 1990's  . Peptic ulcer   . GI bleeding   . BPH (benign prostatic hypertrophy)   . DM (diabetes mellitus)   . Coronary atherosclerosis of native coronary artery     Nonobstructive  . Shortness of breath   . Automatic implantable cardioverter-defibrillator in situ   . CHF (congestive heart failure)   . Chronic kidney disease     Past Surgical History  Procedure Laterality Date  . Adenoidectomy    . Doppler echocardiography  2005, 2011  . Cardiac defibrillator placement  11/27/10    "this is my 3rd defibrillator"  . Cervical disc surgery  1990's    "slipped"  . Laceration repair  ~ 1939    right leg  . Tonsillectomy  ~ 1940  . Insert / replace / remove pacemaker  11/27/10    "this is my third one"  . Eye surgery    . Cataract extraction      "left eye"  . Laparoscopic appendectomy N/A 08/16/2012    Procedure: APPENDECTOMY LAPAROSCOPIC;  Surgeon: Liz Malady, MD;  Location: [facility] OR;  Service: General;  Laterality: N/A;  . Appendectomy  08/16/2012    Social History:   reports that he  quit smoking about 14 months ago. His smoking use included Cigarettes. He smoked 2.00 packs per day. He has never used smokeless tobacco. He reports that he does not drink alcohol or use illicit drugs.  Family History  Problem Relation Age of Onset  . Cancer Father   . Cancer Mother     Throat  . Lung cancer Daughter     Medications: Patient's Medications  New Prescriptions   No medications on file  Previous Medications   ALBUTEROL (ACCUNEB) 1.25 MG/3ML NEBULIZER SOLUTION    Take 1 ampule by nebulization 2 (two) times daily.    BISACODYL (BISACODYL) 5 MG EC TABLET    Take 10 mg by mouth at bedtime.   COLCHICINE 0.6 MG TABLET    Take 0.6 mg by mouth daily.    DORZOLAMIDE HCL-TIMOLOL MAL (COSOPT OP)    Apply 1 drop to eye 2 (two) times daily.    ENALAPRIL (VASOTEC) 10 MG TABLET    Take 1 tablet (10 mg total) by mouth daily.   FAMOTIDINE (PEPCID) 20 MG TABLET    Take 20 mg by mouth 2 (two) times daily.    FLUTICASONE (FLONASE) 50 MCG/ACT NASAL SPRAY    Place 2 sprays into the nose daily.   FLUTICASONE-SALMETEROL (ADVAIR DISKUS) 250-50 MCG/DOSE AEPB    Inhale 1 puff into the lungs 2 (two) times daily.     FUROSEMIDE (LASIX) 40 MG TABLET    Take 40 mg by mouth daily.   GLIMEPIRIDE (AMARYL) 2 MG TABLET    Take 2 mg by mouth 2 (two) times daily.     METOPROLOL (TOPROL-XL) 50 MG 24 HR TABLET    Take 50 mg by mouth daily.     PANTOPRAZOLE (PROTONIX) 40 MG TABLET    Take 40 mg by mouth daily.   POLYETHYLENE GLYCOL (MIRALAX / GLYCOLAX) PACKET    Take 17 g by mouth daily as needed (for constipation).   POTASSIUM CHLORIDE (KLOR-CON) 10 MEQ CR TABLET    Take 10 mEq by mouth 2 (two) times daily.     SIMVASTATIN (ZOCOR) 20 MG TABLET    Take 20 mg by mouth at bedtime.     SITAGLIPTIN (JANUVIA) 50 MG TABLET    Take 50 mg by mouth daily.   TAMSULOSIN HCL (FLOMAX) 0.4 MG CAPS    Take 0.4 mg by mouth daily.     TIOTROPIUM (SPIRIVA) 18 MCG INHALATION CAPSULE    Place 18 mcg into inhaler and inhale daily.   WARFARIN (COUMADIN) 5 MG TABLET    Take 5 mg by mouth daily.   Modified Medications   No medications on file  Discontinued Medications   No medications on file    Physical Exam: Filed Vitals:   08/31/12 1743  BP: 170/94  Pulse: 61  Temp: 97.8 F (36.6 C)  Resp: 20  SpO2: 94%   Physical Exam  Constitutional: He is oriented to person, place, and time.  Pleasant white male, NAD, using oxygen  HENT:  Head: Normocephalic and atraumatic.  Cardiovascular:  Paced rhythm  Pulmonary/Chest: Effort normal.  Few scattered rhonchi  Abdominal: Soft. Bowel sounds are normal. He exhibits no distension. There is no tenderness.  Musculoskeletal: Normal range of  motion.  Neurological: He is alert and oriented to person, place, and time.  Skin: Skin is warm and dry.    Labs reviewed: Basic Metabolic Panel:  Recent Labs  14/78/29 0445 08/19/12 0424 08/21/12 0425  NA 135 136 136  K 4.1 3.7  3.7  CL 98 102 103  CO2 27 27 25   GLUCOSE 132* 131* 113*  BUN 27* 28* 19  CREATININE 2.12* 1.90* 1.42*  CALCIUM 8.5 8.2* 8.3*   Liver Function Tests:  Recent Labs  08/14/12 2035 08/15/12 0955 08/16/12 0535  AST 21 18 15   ALT 17 15 12   ALKPHOS 46 45 47  BILITOT 0.5 0.8 1.0  PROT 7.9 6.8 6.8  ALBUMIN 4.3 3.5 3.3*    Recent Labs  08/14/12 2035  LIPASE 36  CBC:  Recent Labs  08/14/12 2035 08/15/12 0955 08/16/12 0535  08/18/12 0445 08/19/12 0424 08/20/12 0420  WBC 14.1* 13.4* 12.7*  < > 11.9* 8.9 6.5  NEUTROABS 11.5* 11.7* 10.4*  --   --   --   --   HGB 13.1 11.6* 11.6*  < > 12.7* 10.3* 10.2*  HCT 38.4* 36.4* 35.5*  < > 37.7* 31.0* 31.4*  MCV 83.5 85.8 86.4  < > 85.5 84.5 85.6  PLT 135* 121* 107*  < > 87* 137* 151  < > = values in this interval not displayed. CBG:  Recent Labs  08/23/12 2129 08/24/12 0755 08/24/12 1142  GLUCAP 248* 165* 239*   Assessment/Plan:   1. Weakness generalized --has improved significantly with therapy and treatment of pain from polyarthritis  2. Chronic airway obstruction, not elsewhere classified --continue oxygen therapy and current medications--no changes were made here at SNF  3. Gout attack --polyarthritis was treated as a gout flare by his attending provider during stay --improved with steroid and colcrys --suspect he may actually have pseudogout due to involvement of atypical joints   4. S/P appendectomy --doing well now, has had therapy and eating and drinking well  INR to be followed up by home health and results faxed to Dr. Venita Sheffield office.  For afib.  Patient is being discharged with home health services:  PT, OT, RN  Patient is being discharged with the following durable  medical equipment:  rollator walker  Patient has been advised to f/u with their PCP in 1-2 weeks (Dr. Donette Larry 09/13/12 at 11: 30am).    He was provided with a 30 day supply of scripts for prescription medications.  Refills must be obtained from their PCP.

## 2012-09-07 ENCOUNTER — Encounter (INDEPENDENT_AMBULATORY_CARE_PROVIDER_SITE_OTHER): Payer: Medicare Other

## 2012-09-28 ENCOUNTER — Ambulatory Visit (INDEPENDENT_AMBULATORY_CARE_PROVIDER_SITE_OTHER): Payer: Medicare Other | Admitting: Internal Medicine

## 2012-09-28 ENCOUNTER — Encounter: Payer: Self-pay | Admitting: Internal Medicine

## 2012-09-28 VITALS — BP 106/64 | HR 63 | Ht 67.0 in | Wt 196.0 lb

## 2012-09-28 DIAGNOSIS — J45909 Unspecified asthma, uncomplicated: Secondary | ICD-10-CM

## 2012-09-28 DIAGNOSIS — I5022 Chronic systolic (congestive) heart failure: Secondary | ICD-10-CM

## 2012-09-28 DIAGNOSIS — Z45018 Encounter for adjustment and management of other part of cardiac pacemaker: Secondary | ICD-10-CM | POA: Insufficient documentation

## 2012-09-28 LAB — PACEMAKER DEVICE OBSERVATION
BAMS-0001: 150 {beats}/min
BAMS-0003: 70 {beats}/min
BATTERY VOLTAGE: 2.9328 V
LV LEAD THRESHOLD: 1.875 V
LV LEAD THRESHOLD: 2 V
RV LEAD THRESHOLD: 0.875 V
VENTRICULAR PACING PM: 90

## 2012-09-28 NOTE — Assessment & Plan Note (Signed)
Interrogation of his St. Jude device demonstrates normal function. We'll plan to recheck in several months.

## 2012-09-28 NOTE — Patient Instructions (Addendum)
Your physician wants you to follow-up in: 12 months with Dr Taylor You will receive a reminder letter in the mail two months in advance. If you don't receive a letter, please call our office to schedule the follow-up appointment.  Remote monitoring is used to monitor your Pacemaker of ICD from home. This monitoring reduces the number of office visits required to check your device to one time per year. It allows us to keep an eye on the functioning of your device to ensure it is working properly. You are scheduled for a device check from home on 12/27/12. You may send your transmission at any time that day. If you have a wireless device, the transmission will be sent automatically. After your physician reviews your transmission, you will receive a postcard with your next transmission date.    

## 2012-09-28 NOTE — Assessment & Plan Note (Signed)
His symptoms are class II. He'll continue his current medical therapy. He is encouraged to reduce his caloric intake and reduce his sodium intake.

## 2012-09-28 NOTE — Progress Notes (Signed)
HPI [name] returns today for followup. He is an 79 year old man with a history of symptomatic bradycardia, hypertension, and chronic systolic heart failure, status post biventricular pacemaker insertion. He was hospitalized several weeks ago with appendicitis, and underwent surgery followed by rehabilitation. He is improved. He denies chest pain or shortness of breath or abdominal pain or fevers or chills. No syncope. He admits to dietary indiscretion. Allergies  Allergen Reactions  . Adhesive [Tape]     Break out     Current Outpatient Prescriptions  Medication Sig Dispense Refill  . albuterol (ACCUNEB) 1.25 MG/3ML nebulizer solution Take 1 ampule by nebulization 2 (two) times daily.       . bisacodyl (BISACODYL) 5 MG EC tablet Take 10 mg by mouth at bedtime.      . colchicine 0.6 MG tablet Take 0.6 mg by mouth daily.      . Dorzolamide HCl-Timolol Mal (COSOPT OP) Apply 1 drop to eye 2 (two) times daily.       . enalapril (VASOTEC) 10 MG tablet Take 1 tablet (10 mg total) by mouth daily.  30 tablet  6  . famotidine (PEPCID) 20 MG tablet Take 20 mg by mouth 2 (two) times daily.       . fluticasone (FLONASE) 50 MCG/ACT nasal spray Place 2 sprays into the nose daily.      . Fluticasone-Salmeterol (ADVAIR DISKUS) 250-50 MCG/DOSE AEPB Inhale 1 puff into the lungs 2 (two) times daily.        . furosemide (LASIX) 40 MG tablet Take 40 mg by mouth daily.      Marland Kitchen glimepiride (AMARYL) 2 MG tablet Take 2 mg by mouth 2 (two) times daily.        . metoprolol (TOPROL-XL) 50 MG 24 hr tablet Take 50 mg by mouth daily.        . pantoprazole (PROTONIX) 40 MG tablet Take 40 mg by mouth daily.      . polyethylene glycol (MIRALAX / GLYCOLAX) packet Take 17 g by mouth daily as needed (for constipation).      . potassium chloride (KLOR-CON) 10 MEQ CR tablet Take 10 mEq by mouth 2 (two) times daily.        . simvastatin (ZOCOR) 20 MG tablet Take 20 mg by mouth at bedtime.        . sitaGLIPtin (JANUVIA) 50 MG  tablet Take 50 mg by mouth daily.      . Tamsulosin HCl (FLOMAX) 0.4 MG CAPS Take 0.4 mg by mouth daily.        Marland Kitchen tiotropium (SPIRIVA) 18 MCG inhalation capsule Place 18 mcg into inhaler and inhale daily.      Marland Kitchen warfarin (COUMADIN) 5 MG tablet Take 5 mg by mouth daily.        No current facility-administered medications for this visit.     Past Medical History  Diagnosis Date  . Emphysema     Oxygen-dependent  . Chronic systolic heart failure   . Implantable cardiac defibrillator infection     July 2012  . ICD (implantable cardiac defibrillator) in place     CRT St Jude; remote - yes  . Nonischemic cardiomyopathy     a. Catheterization 2005 no obstructive coronary disease;  b. 02/2009 Echo: EF 35-40%, Gr 2 DD, mild lvh.  Marland Kitchen HLD (hyperlipidemia)   . Essential hypertension, benign   . Myocardial infarction 1990's  . Peptic ulcer   . GI bleeding   . BPH (benign prostatic hypertrophy)   .  DM (diabetes mellitus)   . Coronary atherosclerosis of native coronary artery     Nonobstructive  . Shortness of breath   . Automatic implantable cardioverter-defibrillator in situ   . CHF (congestive heart failure)   . Chronic kidney disease     ROS:   All systems reviewed and negative except as noted in the HPI.   Past Surgical History  Procedure Laterality Date  . Adenoidectomy    . Doppler echocardiography  2005, 2011  . Cardiac defibrillator placement  11/27/10    "this is my 3rd defibrillator"  . Cervical disc surgery  1990's    "slipped"  . Laceration repair  ~ 1939    right leg  . Tonsillectomy  ~ 1940  . Insert / replace / remove pacemaker  11/27/10    "this is my third one"  . Eye surgery    . Cataract extraction      "left eye"  . Laparoscopic appendectomy N/A 08/16/2012    Procedure: APPENDECTOMY LAPAROSCOPIC;  Surgeon: Liz Malady, MD;  Location: [facility] OR;  Service: General;  Laterality: N/A;  . Appendectomy  08/16/2012     Family History  Problem Relation Age of  Onset  . Cancer Father   . Cancer Mother     Throat  . Lung cancer Daughter      History   Social History  . Marital Status: Widowed    Spouse Name: N/A    Number of Children: N/A  . Years of Education: N/A   Occupational History  . Not on file.   Social History Main Topics  . Smoking status: Former Smoker -- 2.00 packs/day    Types: Cigarettes    Quit date: 06/20/2011  . Smokeless tobacco: Never Used     Comment: started at age 77; smoked 1-2.5 ppd; quit 11/11  . Alcohol Use: No  . Drug Use: No  . Sexual Activity: Not on file   Other Topics Concern  . Not on file   Social History Narrative   Divorced, retired [employer] architect.      BP 106/64  Pulse 63  Ht 5\' 7"  (1.702 m)  Wt 196 lb (88.905 kg)  BMI 30.69 kg/m2  Physical Exam:  Well appearing 79 year old man,NAD HEENT: Unremarkable Neck:  7 cm JVD, no thyromegally Back:  No CVA tenderness Lungs:  Clear except for scattered basilar rales, no wheezes, no rhonchi. HEART:  Regular rate rhythm, no murmurs, no rubs, no clicks Abd:  soft, obese, positive bowel sounds, no organomegally, no rebound, no guarding Ext:  2 plus pulses, no edema, no cyanosis, no clubbing Skin:  No rashes no nodules Neuro:  CN II through XII intact, motor grossly intact   DEVICE  Normal device function.  See PaceArt for details.   Assess/Plan:

## 2012-10-01 ENCOUNTER — Encounter: Payer: Medicare Other | Admitting: Internal Medicine

## 2012-10-04 ENCOUNTER — Encounter: Payer: Self-pay | Admitting: Internal Medicine

## 2012-10-19 ENCOUNTER — Encounter (INDEPENDENT_AMBULATORY_CARE_PROVIDER_SITE_OTHER): Payer: Self-pay | Admitting: General Surgery

## 2012-10-19 ENCOUNTER — Ambulatory Visit (INDEPENDENT_AMBULATORY_CARE_PROVIDER_SITE_OTHER): Payer: Medicare Other | Admitting: General Surgery

## 2012-10-19 VITALS — BP 130/70 | HR 96 | Temp 97.4°F | Resp 17 | Ht 66.0 in | Wt 192.8 lb

## 2012-10-19 DIAGNOSIS — Z9889 Other specified postprocedural states: Secondary | ICD-10-CM

## 2012-10-19 DIAGNOSIS — K3580 Unspecified acute appendicitis: Secondary | ICD-10-CM

## 2012-10-19 DIAGNOSIS — Z9049 Acquired absence of other specified parts of digestive tract: Secondary | ICD-10-CM

## 2012-10-19 NOTE — Progress Notes (Signed)
Robert Davies 12-12-1930 [id number] 10/19/2012   History of Present Illness: Robert Davies is a  79 y.o. male who presents today status post lap appy by Dr. Violeta Gelinas .  Pathology reveals acute suppurative appendicitis The patient is tolerating a regular diet, having normal bowel movements, has good pain control.  He is back to most normal activities.   Physical Exam: Filed Vitals:   10/19/12 1332  BP: 130/70  Pulse: 96  Temp: 97.4 F (36.3 C)  Resp: 17    Abd: soft, nontender, active bowel sounds, nondistended.  All incisions are well healed.  Impression: 1.  Acute appendicitis, s/p lap appy  Plan: He is able to return to normal activities. We reviewed his pathology report and a copy was provided He may follow up on a prn basis.

## 2012-10-19 NOTE — Patient Instructions (Addendum)
You may resume to normal activities.  Be sure to follow up with your primary care doctor.  Thank you for allowing [place] to be a part of your care.

## 2012-12-27 ENCOUNTER — Ambulatory Visit (INDEPENDENT_AMBULATORY_CARE_PROVIDER_SITE_OTHER): Payer: Medicare Other | Admitting: *Deleted

## 2012-12-27 ENCOUNTER — Encounter: Payer: Self-pay | Admitting: Internal Medicine

## 2012-12-27 DIAGNOSIS — I429 Cardiomyopathy, unspecified: Secondary | ICD-10-CM

## 2012-12-27 LAB — MDC_IDC_ENUM_SESS_TYPE_REMOTE
Battery Remaining Longevity: 40 mo
Lead Channel Impedance Value: 440 Ohm
Lead Channel Impedance Value: 460 Ohm
Lead Channel Impedance Value: 700 Ohm
Lead Channel Pacing Threshold Pulse Width: 0.5 ms
Lead Channel Pacing Threshold Pulse Width: 1 ms
Lead Channel Setting Pacing Amplitude: 2 V
Lead Channel Setting Pacing Amplitude: 2.625
Lead Channel Setting Pacing Pulse Width: 0.5 ms
Lead Channel Setting Pacing Pulse Width: 1 ms

## 2013-01-07 ENCOUNTER — Encounter: Payer: Self-pay | Admitting: *Deleted

## 2013-03-28 ENCOUNTER — Telehealth: Payer: Self-pay | Admitting: Internal Medicine

## 2013-03-28 NOTE — Telephone Encounter (Signed)
Left message for patient to return my call.

## 2013-03-28 NOTE — Telephone Encounter (Signed)
New Problem:  Pt is c/o being able to "feel his pacemaker". Pt states his pacemaker is "getting bigger"... PT would like to be worked in sooner to see Dr. Ladona Ridgel or his PA [place]. Pt would like a call back from the nurse.

## 2013-03-30 ENCOUNTER — Encounter: Payer: Medicare Other | Admitting: *Deleted

## 2013-03-30 NOTE — Telephone Encounter (Signed)
Called patient again, I have left him another message to return my call

## 2013-04-01 ENCOUNTER — Encounter: Payer: Self-pay | Admitting: Internal Medicine

## 2013-04-01 ENCOUNTER — Encounter (HOSPITAL_COMMUNITY): Payer: Self-pay | Admitting: Pharmacy Technician

## 2013-04-01 ENCOUNTER — Ambulatory Visit (INDEPENDENT_AMBULATORY_CARE_PROVIDER_SITE_OTHER): Payer: Commercial Managed Care - HMO | Admitting: Internal Medicine

## 2013-04-01 ENCOUNTER — Encounter (HOSPITAL_COMMUNITY): Payer: Self-pay | Admitting: *Deleted

## 2013-04-01 ENCOUNTER — Telehealth: Payer: Self-pay | Admitting: *Deleted

## 2013-04-01 ENCOUNTER — Encounter: Payer: Commercial Managed Care - HMO | Admitting: *Deleted

## 2013-04-01 DIAGNOSIS — Z45018 Encounter for adjustment and management of other part of cardiac pacemaker: Secondary | ICD-10-CM | POA: Diagnosis not present

## 2013-04-01 DIAGNOSIS — T827XXA Infection and inflammatory reaction due to other cardiac and vascular devices, implants and grafts, initial encounter: Secondary | ICD-10-CM

## 2013-04-01 DIAGNOSIS — I509 Heart failure, unspecified: Secondary | ICD-10-CM

## 2013-04-01 LAB — CBC WITH DIFFERENTIAL/PLATELET
BASOS PCT: 0.3 % (ref 0.0–3.0)
Basophils Absolute: 0 10*3/uL (ref 0.0–0.1)
EOS PCT: 5.2 % — AB (ref 0.0–5.0)
Eosinophils Absolute: 0.4 10*3/uL (ref 0.0–0.7)
HEMATOCRIT: 40 % (ref 39.0–52.0)
HEMOGLOBIN: 13.1 g/dL (ref 13.0–17.0)
LYMPHS ABS: 1.4 10*3/uL (ref 0.7–4.0)
Lymphocytes Relative: 20.6 % (ref 12.0–46.0)
MCHC: 32.8 g/dL (ref 30.0–36.0)
MCV: 89.9 fl (ref 78.0–100.0)
MONO ABS: 0.7 10*3/uL (ref 0.1–1.0)
MONOS PCT: 11 % (ref 3.0–12.0)
NEUTROS ABS: 4.3 10*3/uL (ref 1.4–7.7)
Neutrophils Relative %: 62.9 % (ref 43.0–77.0)
PLATELETS: 206 10*3/uL (ref 150.0–400.0)
RBC: 4.45 Mil/uL (ref 4.22–5.81)
RDW: 15.8 % — ABNORMAL HIGH (ref 11.5–14.6)
WBC: 6.8 10*3/uL (ref 4.5–10.5)

## 2013-04-01 LAB — COMPREHENSIVE METABOLIC PANEL
ALK PHOS: 43 U/L (ref 39–117)
ALT: 22 U/L (ref 0–53)
AST: 25 U/L (ref 0–37)
Albumin: 4.4 g/dL (ref 3.5–5.2)
BILIRUBIN TOTAL: 0.8 mg/dL (ref 0.3–1.2)
BUN: 21 mg/dL (ref 6–23)
CO2: 32 meq/L (ref 19–32)
CREATININE: 1.6 mg/dL — AB (ref 0.4–1.5)
Calcium: 9.3 mg/dL (ref 8.4–10.5)
Chloride: 100 mEq/L (ref 96–112)
GFR: 52.9 mL/min — AB (ref >60.00)
GLUCOSE: 105 mg/dL — AB (ref 70–99)
Potassium: 3.9 mEq/L (ref 3.5–5.1)
SODIUM: 140 meq/L (ref 135–145)
TOTAL PROTEIN: 8 g/dL (ref 6.0–8.3)

## 2013-04-01 LAB — APTT: APTT: 37.8 s — AB (ref 21.7–28.8)

## 2013-04-01 LAB — PROTIME-INR
INR: 2.9 ratio — ABNORMAL HIGH (ref 0.8–1.0)
Prothrombin Time: 29.7 s — ABNORMAL HIGH (ref 10.2–12.4)

## 2013-04-01 LAB — TSH: TSH: 0.44 u[IU]/mL (ref 0.35–5.50)

## 2013-04-01 NOTE — Assessment & Plan Note (Signed)
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 He is not clinically ill but has a large tender area over his right sided PPM pocket. I have discussed the treatment of this problem and have recommended her proceed with PM system extraction. He will be difficult to treat long term as he has almost certainly gotten an occluded left subclavian vein.

## 2013-04-01 NOTE — Telephone Encounter (Signed)
LM for pt to call me back on my personal cell phone (since it is after hours) to discuss upcoming device extraction on Monday. Device extraction scheduled for Monday at 7:30 w/ Ladona Ridgel & Cornelius Moras. Be at hospital at 5:30 am  No Lasix, no Januvia morning of procedure. No Coumadin till after surgery (INR came back today at 2.9), per Dr. Ladona Ridgel.

## 2013-04-01 NOTE — Assessment & Plan Note (Addendum)
His device pocket is infected. He will need to be scheduled for extraction. Will assess the need for a temporary PM at the time of his extraction.

## 2013-04-01 NOTE — Progress Notes (Signed)
HPI [name] returns today for an unscheduled followup. He is an 80 year old man with a history of symptomatic bradycardia, hypertension, and chronic systolic heart failure, status post biventricular pacemaker insertion. He was hospitalized several months ago with appendicitis, and underwent surgery followed by rehabilitation. He improved. Since then, he has had gradual enlargement of his PM pocket. He denies chest pain or shortness of breath or abdominal pain or fevers or chills. No syncope. He admits to dietary indiscretion. His PPM pocket is not sore or red. 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  Allergies  Allergen Reactions  . Adhesive [Tape]     Break out     Current Outpatient Prescriptions  Medication Sig Dispense Refill  . albuterol (ACCUNEB) 1.25 MG/3ML nebulizer solution Take 1 ampule by nebulization 3 (three) times daily.       . bisacodyl (BISACODYL) 5 MG EC tablet Take 10 mg by mouth at bedtime.      . colchicine 0.6 MG tablet Take 0.6 mg by mouth daily.      . Dorzolamide HCl-Timolol Mal (COSOPT OP) Apply 1 drop to eye 2 (two) times daily.       . enalapril (VASOTEC) 10 MG tablet Take 1 tablet (10 mg total) by mouth daily.  30 tablet  6  . famotidine (PEPCID) 20 MG tablet Take 20 mg by mouth 2 (two) times daily.       . fluticasone (FLONASE) 50 MCG/ACT nasal spray Place 2 sprays into the nose daily.      . Fluticasone-Salmeterol (ADVAIR DISKUS) 250-50 MCG/DOSE AEPB Inhale 1 puff into the lungs 2 (two) times daily.        . furosemide (LASIX) 40 MG tablet Take 40 mg by mouth daily.      Marland Kitchen. glimepiride (AMARYL) 2 MG tablet Take 2 mg by mouth 2 (two) times daily.        . metoprolol (TOPROL-XL) 50 MG 24 hr tablet Take 50 mg by mouth daily.        . potassium chloride (KLOR-CON) 10 MEQ CR tablet Take 10 mEq by mouth 2 (two) times daily.        . simvastatin (ZOCOR) 20 MG tablet Take 20 mg by mouth at  bedtime.        . sitaGLIPtin (JANUVIA) 50 MG tablet Take 50 mg by mouth daily.      . Tamsulosin HCl (FLOMAX) 0.4 MG CAPS Take 0.4 mg by mouth daily.        Marland Kitchen. tiotropium (SPIRIVA) 18 MCG inhalation capsule Place 18 mcg into inhaler and inhale daily.      Marland Kitchen. warfarin (COUMADIN) 5 MG tablet Take 5 mg by mouth daily.        No current facility-administered medications for this visit.     Past Medical History  Diagnosis Date  . Emphysema     Oxygen-dependent  . Chronic systolic heart failure   . Implantable cardiac defibrillator infection     July 2012  . ICD (implantable cardiac defibrillator) in place     CRT St Jude; remote - yes  . Nonischemic cardiomyopathy     a. Catheterization 2005 no obstructive coronary disease;  b. 02/2009 Echo: EF 35-40%, Gr 2 DD, mild lvh.  Marland Kitchen. HLD (hyperlipidemia)   . Essential hypertension, benign   . Myocardial infarction 1990's  . Peptic ulcer   . GI bleeding   . BPH (benign prostatic hypertrophy)   .  DM (diabetes mellitus)   . Coronary atherosclerosis of native coronary artery     Nonobstructive  . Shortness of breath   . Automatic implantable cardioverter-defibrillator in situ   . CHF (congestive heart failure)   . Chronic kidney disease     ROS:   All systems reviewed and negative except as noted in the HPI.   Past Surgical History  Procedure Laterality Date  . Adenoidectomy    . Doppler echocardiography  2005, 2011  . Cardiac defibrillator placement  11/27/10    "this is my 3rd defibrillator"  . Cervical disc surgery  1990's    "slipped"  . Laceration repair  ~ 1939    right leg  . Tonsillectomy  ~ 1940  . Insert / replace / remove pacemaker  11/27/10    "this is my third one"  . Eye surgery    . Cataract extraction      "left eye"  . Laparoscopic appendectomy N/A 08/16/2012    Procedure: APPENDECTOMY LAPAROSCOPIC;  Surgeon: Liz Malady, MD;  Location: [facility] OR;  Service: General;  Laterality: N/A;  . Appendectomy  08/16/2012      Family History  Problem Relation Age of Onset  . Cancer Father   . Cancer Mother     Throat  . Lung cancer Daughter      History   Social History  . Marital Status: Widowed    Spouse Name: N/A    Number of Children: N/A  . Years of Education: N/A   Occupational History  . Not on file.   Social History Main Topics  . Smoking status: Former Smoker -- 2.00 packs/day    Types: Cigarettes    Quit date: 06/20/2011  . Smokeless tobacco: Never Used     Comment: started at age 89; smoked 1-2.5 ppd; quit 11/11  . Alcohol Use: No  . Drug Use: No  . Sexual Activity: Not on file   Other Topics Concern  . Not on file   Social History Narrative   Divorced, retired Naval architect.      There were no vitals taken for this visit.  Physical Exam:  Well appearing 80 year old man,NAD HEENT: Unremarkable Neck:  7 cm JVD, no thyromegally Back:  No CVA tenderness Lungs:  Clear except for scattered basilar rales, no wheezes, no rhonchi. HEART:  Regular rate rhythm, no murmurs, no rubs, no clicks Abd:  soft, obese, positive bowel sounds, no organomegally, no rebound, no guarding Ext:  2 plus pulses, no edema, no cyanosis, no clubbing Skin:  No rashes no nodules Neuro:  CN II through XII intact, motor grossly intact   DEVICE  Normal device function.  See PaceArt for details.   Assess/Plan:

## 2013-04-01 NOTE — Progress Notes (Signed)
I notified St Jude rep. , Wynona Canes of patient procedure for Monday, March 16.

## 2013-04-04 ENCOUNTER — Inpatient Hospital Stay (HOSPITAL_COMMUNITY): Payer: Medicare HMO | Admitting: Certified Registered"

## 2013-04-04 ENCOUNTER — Encounter (HOSPITAL_COMMUNITY): Payer: Medicare HMO | Admitting: Certified Registered"

## 2013-04-04 ENCOUNTER — Encounter (HOSPITAL_COMMUNITY)
Admission: RE | Disposition: A | Discharge status: Home / Self Care | Payer: Self-pay | Source: Ambulatory Visit | Attending: Internal Medicine

## 2013-04-04 ENCOUNTER — Ambulatory Visit (HOSPITAL_COMMUNITY)
Admission: RE | Admit: 2013-04-04 | Discharge: 2013-04-05 | Disposition: A | Discharge status: Home / Self Care | Payer: Medicare HMO | Source: Ambulatory Visit | Attending: Internal Medicine | Admitting: Internal Medicine

## 2013-04-04 ENCOUNTER — Encounter (HOSPITAL_COMMUNITY): Payer: Self-pay | Admitting: *Deleted

## 2013-04-04 ENCOUNTER — Inpatient Hospital Stay (HOSPITAL_COMMUNITY): Payer: Medicare HMO

## 2013-04-04 DIAGNOSIS — Z9049 Acquired absence of other specified parts of digestive tract: Secondary | ICD-10-CM

## 2013-04-04 DIAGNOSIS — R109 Unspecified abdominal pain: Secondary | ICD-10-CM

## 2013-04-04 DIAGNOSIS — N189 Chronic kidney disease, unspecified: Secondary | ICD-10-CM | POA: Insufficient documentation

## 2013-04-04 DIAGNOSIS — J438 Other emphysema: Secondary | ICD-10-CM | POA: Insufficient documentation

## 2013-04-04 DIAGNOSIS — Y831 Surgical operation with implant of artificial internal device as the cause of abnormal reaction of the patient, or of later complication, without mention of misadventure at the time of the procedure: Secondary | ICD-10-CM | POA: Insufficient documentation

## 2013-04-04 DIAGNOSIS — I129 Hypertensive chronic kidney disease with stage 1 through stage 4 chronic kidney disease, or unspecified chronic kidney disease: Secondary | ICD-10-CM | POA: Insufficient documentation

## 2013-04-04 DIAGNOSIS — Z01810 Encounter for preprocedural cardiovascular examination: Secondary | ICD-10-CM

## 2013-04-04 DIAGNOSIS — R531 Weakness: Secondary | ICD-10-CM

## 2013-04-04 DIAGNOSIS — I498 Other specified cardiac arrhythmias: Secondary | ICD-10-CM | POA: Insufficient documentation

## 2013-04-04 DIAGNOSIS — E785 Hyperlipidemia, unspecified: Secondary | ICD-10-CM

## 2013-04-04 DIAGNOSIS — T827XXA Infection and inflammatory reaction due to other cardiac and vascular devices, implants and grafts, initial encounter: Principal | ICD-10-CM | POA: Diagnosis present

## 2013-04-04 DIAGNOSIS — E119 Type 2 diabetes mellitus without complications: Secondary | ICD-10-CM | POA: Insufficient documentation

## 2013-04-04 DIAGNOSIS — J9610 Chronic respiratory failure, unspecified whether with hypoxia or hypercapnia: Secondary | ICD-10-CM

## 2013-04-04 DIAGNOSIS — N400 Enlarged prostate without lower urinary tract symptoms: Secondary | ICD-10-CM | POA: Insufficient documentation

## 2013-04-04 DIAGNOSIS — I2510 Atherosclerotic heart disease of native coronary artery without angina pectoris: Secondary | ICD-10-CM | POA: Insufficient documentation

## 2013-04-04 DIAGNOSIS — I509 Heart failure, unspecified: Secondary | ICD-10-CM | POA: Insufficient documentation

## 2013-04-04 DIAGNOSIS — I429 Cardiomyopathy, unspecified: Secondary | ICD-10-CM

## 2013-04-04 DIAGNOSIS — J449 Chronic obstructive pulmonary disease, unspecified: Secondary | ICD-10-CM

## 2013-04-04 DIAGNOSIS — I428 Other cardiomyopathies: Secondary | ICD-10-CM | POA: Insufficient documentation

## 2013-04-04 DIAGNOSIS — Z9981 Dependence on supplemental oxygen: Secondary | ICD-10-CM | POA: Insufficient documentation

## 2013-04-04 DIAGNOSIS — K219 Gastro-esophageal reflux disease without esophagitis: Secondary | ICD-10-CM

## 2013-04-04 DIAGNOSIS — Z01811 Encounter for preprocedural respiratory examination: Secondary | ICD-10-CM

## 2013-04-04 DIAGNOSIS — I5022 Chronic systolic (congestive) heart failure: Secondary | ICD-10-CM | POA: Insufficient documentation

## 2013-04-04 DIAGNOSIS — Z87891 Personal history of nicotine dependence: Secondary | ICD-10-CM | POA: Insufficient documentation

## 2013-04-04 DIAGNOSIS — I6789 Other cerebrovascular disease: Secondary | ICD-10-CM

## 2013-04-04 DIAGNOSIS — Z7901 Long term (current) use of anticoagulants: Secondary | ICD-10-CM | POA: Insufficient documentation

## 2013-04-04 DIAGNOSIS — Z45018 Encounter for adjustment and management of other part of cardiac pacemaker: Secondary | ICD-10-CM

## 2013-04-04 DIAGNOSIS — J45909 Unspecified asthma, uncomplicated: Secondary | ICD-10-CM

## 2013-04-04 HISTORY — PX: PACEMAKER LEAD REMOVAL: SHX5064

## 2013-04-04 LAB — CBC
HCT: 38.5 % — ABNORMAL LOW (ref 39.0–52.0)
Hemoglobin: 12.3 g/dL — ABNORMAL LOW (ref 13.0–17.0)
MCH: 28.9 pg (ref 26.0–34.0)
MCHC: 31.9 g/dL (ref 30.0–36.0)
MCV: 90.6 fL (ref 78.0–100.0)
PLATELETS: 183 10*3/uL (ref 150–400)
RBC: 4.25 MIL/uL (ref 4.22–5.81)
RDW: 14.9 % (ref 11.5–15.5)
WBC: 6.1 10*3/uL (ref 4.0–10.5)

## 2013-04-04 LAB — BASIC METABOLIC PANEL
BUN: 27 mg/dL — ABNORMAL HIGH (ref 6–23)
CALCIUM: 8.8 mg/dL (ref 8.4–10.5)
CO2: 24 mEq/L (ref 19–32)
Chloride: 108 mEq/L (ref 96–112)
Creatinine, Ser: 1.39 mg/dL — ABNORMAL HIGH (ref 0.50–1.35)
GFR, EST AFRICAN AMERICAN: 54 mL/min — AB (ref >90)
GFR, EST NON AFRICAN AMERICAN: 46 mL/min — AB (ref >90)
GLUCOSE: 102 mg/dL — AB (ref 70–99)
POTASSIUM: 4.3 meq/L (ref 3.7–5.3)
SODIUM: 145 meq/L (ref 137–147)

## 2013-04-04 LAB — PREPARE RBC (CROSSMATCH)

## 2013-04-04 LAB — GLUCOSE, CAPILLARY
GLUCOSE-CAPILLARY: 146 mg/dL — AB (ref 70–99)
GLUCOSE-CAPILLARY: 124 mg/dL — AB (ref 70–99)
Glucose-Capillary: 97 mg/dL (ref 70–99)
Glucose-Capillary: 119 mg/dL — ABNORMAL HIGH (ref 70–99)
Glucose-Capillary: 172 mg/dL — ABNORMAL HIGH (ref 70–99)

## 2013-04-04 SURGERY — REMOVAL, ELECTRODE LEAD, CARDIAC PACEMAKER, WITHOUT REPLACEMENT
Anesthesia: General | Site: Chest

## 2013-04-04 MED ORDER — ROCURONIUM BROMIDE 50 MG/5ML IV SOLN
INTRAVENOUS | Status: AC
  Filled 2013-04-04: qty 1

## 2013-04-04 MED ORDER — SODIUM CHLORIDE 0.9 % IR SOLN
Status: DC | PRN
  Administered 2013-04-04: 08:00:00

## 2013-04-04 MED ORDER — FENTANYL CITRATE 0.05 MG/ML IJ SOLN
INTRAMUSCULAR | Status: DC | PRN
  Administered 2013-04-04 (×4): 50 ug via INTRAVENOUS

## 2013-04-04 MED ORDER — PHENYLEPHRINE HCL 10 MG/ML IJ SOLN
10.0000 mg | INTRAVENOUS | Status: DC | PRN
  Administered 2013-04-04: 10 ug/min via INTRAVENOUS

## 2013-04-04 MED ORDER — LIDOCAINE HCL (CARDIAC) 20 MG/ML IV SOLN
INTRAVENOUS | Status: DC | PRN
  Administered 2013-04-04: 50 mg via INTRAVENOUS

## 2013-04-04 MED ORDER — PROPOFOL 10 MG/ML IV BOLUS
INTRAVENOUS | Status: DC | PRN
  Administered 2013-04-04: 110 mg via INTRAVENOUS

## 2013-04-04 MED ORDER — TIOTROPIUM BROMIDE MONOHYDRATE 18 MCG IN CAPS
18.0000 ug | ORAL_CAPSULE | Freq: Every day | RESPIRATORY_TRACT | Status: DC
  Administered 2013-04-05: 18 ug via RESPIRATORY_TRACT
  Filled 2013-04-04: qty 5

## 2013-04-04 MED ORDER — FUROSEMIDE 40 MG PO TABS
40.0000 mg | ORAL_TABLET | Freq: Two times a day (BID) | ORAL | Status: DC
  Administered 2013-04-04 – 2013-04-05 (×2): 40 mg via ORAL
  Filled 2013-04-04 (×5): qty 1

## 2013-04-04 MED ORDER — POLYETHYLENE GLYCOL 3350 17 G PO PACK
17.0000 g | PACK | Freq: Every day | ORAL | Status: DC
Start: 2013-04-04 — End: 2013-04-05
  Administered 2013-04-04 – 2013-04-05 (×2): 17 g via ORAL
  Filled 2013-04-04 (×2): qty 1

## 2013-04-04 MED ORDER — LINAGLIPTIN 5 MG PO TABS
5.0000 mg | ORAL_TABLET | Freq: Every day | ORAL | Status: DC
  Administered 2013-04-04 – 2013-04-05 (×2): 5 mg via ORAL
  Filled 2013-04-04 (×2): qty 1

## 2013-04-04 MED ORDER — WARFARIN - PHYSICIAN DOSING INPATIENT: Freq: Every day | Status: DC

## 2013-04-04 MED ORDER — ARTIFICIAL TEARS OP OINT
TOPICAL_OINTMENT | OPHTHALMIC | Status: DC | PRN
  Administered 2013-04-04: 1 via OPHTHALMIC

## 2013-04-04 MED ORDER — ALBUTEROL SULFATE (2.5 MG/3ML) 0.083% IN NEBU: 1.2500 mg | INHALATION_SOLUTION | Freq: Three times a day (TID) | RESPIRATORY_TRACT | Status: DC

## 2013-04-04 MED ORDER — HYDROXYZINE HCL 25 MG PO TABS
25.0000 mg | ORAL_TABLET | Freq: Every evening | ORAL | Status: DC | PRN
  Filled 2013-04-04: qty 1

## 2013-04-04 MED ORDER — PANTOPRAZOLE SODIUM 40 MG PO TBEC
40.0000 mg | DELAYED_RELEASE_TABLET | Freq: Every day | ORAL | Status: DC
  Administered 2013-04-04 – 2013-04-05 (×2): 40 mg via ORAL
  Filled 2013-04-04 (×2): qty 1

## 2013-04-04 MED ORDER — HYDROMORPHONE HCL PF 1 MG/ML IJ SOLN
0.2500 mg | INTRAMUSCULAR | Status: DC | PRN
  Administered 2013-04-04: 0.5 mg via INTRAVENOUS

## 2013-04-04 MED ORDER — ROCURONIUM BROMIDE 100 MG/10ML IV SOLN
INTRAVENOUS | Status: DC | PRN
  Administered 2013-04-04: 50 mg via INTRAVENOUS

## 2013-04-04 MED ORDER — METOPROLOL SUCCINATE ER 50 MG PO TB24
50.0000 mg | ORAL_TABLET | Freq: Every day | ORAL | Status: DC
  Administered 2013-04-04 – 2013-04-05 (×2): 50 mg via ORAL
  Filled 2013-04-04 (×2): qty 1

## 2013-04-04 MED ORDER — HYDROMORPHONE HCL PF 1 MG/ML IJ SOLN
INTRAMUSCULAR | Status: AC
  Filled 2013-04-04: qty 1

## 2013-04-04 MED ORDER — NEOSTIGMINE METHYLSULFATE 1 MG/ML IJ SOLN
INTRAMUSCULAR | Status: AC
  Filled 2013-04-04: qty 10

## 2013-04-04 MED ORDER — NEOSTIGMINE METHYLSULFATE 1 MG/ML IJ SOLN
INTRAMUSCULAR | Status: DC | PRN
  Administered 2013-04-04: 5 mg via INTRAVENOUS

## 2013-04-04 MED ORDER — DORZOLAMIDE HCL-TIMOLOL MAL 2-0.5 % OP SOLN
1.0000 [drp] | Freq: Two times a day (BID) | OPHTHALMIC | Status: DC
  Administered 2013-04-04 – 2013-04-05 (×3): 1 [drp] via OPHTHALMIC
  Filled 2013-04-04: qty 10

## 2013-04-04 MED ORDER — LIDOCAINE HCL (PF) 1 % IJ SOLN
INTRAMUSCULAR | Status: AC
  Filled 2013-04-04: qty 30

## 2013-04-04 MED ORDER — FENTANYL CITRATE 0.05 MG/ML IJ SOLN
INTRAMUSCULAR | Status: AC
  Filled 2013-04-04: qty 5

## 2013-04-04 MED ORDER — ONDANSETRON HCL 4 MG/2ML IJ SOLN: 4.0000 mg | Freq: Once | INTRAMUSCULAR | Status: DC | PRN

## 2013-04-04 MED ORDER — POLYSACCHARIDE IRON COMPLEX 150 MG PO CAPS
150.0000 mg | ORAL_CAPSULE | Freq: Two times a day (BID) | ORAL | Status: DC
  Administered 2013-04-04 – 2013-04-05 (×3): 150 mg via ORAL
  Filled 2013-04-04 (×4): qty 1

## 2013-04-04 MED ORDER — COLCHICINE 0.6 MG PO TABS
0.6000 mg | ORAL_TABLET | Freq: Every day | ORAL | Status: DC
  Administered 2013-04-04 – 2013-04-05 (×2): 0.6 mg via ORAL
  Filled 2013-04-04 (×2): qty 1

## 2013-04-04 MED ORDER — PROPOFOL 10 MG/ML IV BOLUS
INTRAVENOUS | Status: AC
  Filled 2013-04-04: qty 20

## 2013-04-04 MED ORDER — ACETAMINOPHEN 325 MG PO TABS
325.0000 mg | ORAL_TABLET | ORAL | Status: DC | PRN
Start: 2013-04-04 — End: 2013-04-05

## 2013-04-04 MED ORDER — LIDOCAINE HCL (CARDIAC) 20 MG/ML IV SOLN
INTRAVENOUS | Status: AC
  Filled 2013-04-04: qty 5

## 2013-04-04 MED ORDER — ONDANSETRON HCL 4 MG/2ML IJ SOLN
INTRAMUSCULAR | Status: AC
  Filled 2013-04-04: qty 2

## 2013-04-04 MED ORDER — GLIMEPIRIDE 2 MG PO TABS
2.0000 mg | ORAL_TABLET | Freq: Two times a day (BID) | ORAL | Status: DC
  Administered 2013-04-04 – 2013-04-05 (×2): 2 mg via ORAL
  Filled 2013-04-04 (×5): qty 1

## 2013-04-04 MED ORDER — POTASSIUM CHLORIDE CRYS ER 10 MEQ PO TBCR
10.0000 meq | EXTENDED_RELEASE_TABLET | Freq: Every day | ORAL | Status: DC
  Administered 2013-04-04 – 2013-04-05 (×2): 10 meq via ORAL
  Filled 2013-04-04 (×2): qty 1

## 2013-04-04 MED ORDER — WARFARIN SODIUM 5 MG PO TABS
5.0000 mg | ORAL_TABLET | Freq: Every day | ORAL | Status: DC
  Administered 2013-04-04: 5 mg via ORAL
  Filled 2013-04-04 (×3): qty 1

## 2013-04-04 MED ORDER — 0.9 % SODIUM CHLORIDE (POUR BTL) OPTIME
TOPICAL | Status: DC | PRN
  Administered 2013-04-04: 1000 mL

## 2013-04-04 MED ORDER — VANCOMYCIN HCL IN DEXTROSE 1-5 GM/200ML-% IV SOLN
1000.0000 mg | INTRAVENOUS | Status: AC
  Administered 2013-04-04: 1000 mg via INTRAVENOUS

## 2013-04-04 MED ORDER — TRAMADOL HCL 50 MG PO TABS
50.0000 mg | ORAL_TABLET | Freq: Four times a day (QID) | ORAL | Status: DC | PRN
Start: 2013-04-04 — End: 2013-04-05
  Administered 2013-04-04: 50 mg via ORAL
  Filled 2013-04-04: qty 1

## 2013-04-04 MED ORDER — VANCOMYCIN HCL IN DEXTROSE 1-5 GM/200ML-% IV SOLN
1000.0000 mg | Freq: Two times a day (BID) | INTRAVENOUS | Status: AC
  Administered 2013-04-04: 1000 mg via INTRAVENOUS
  Filled 2013-04-04: qty 200

## 2013-04-04 MED ORDER — ALBUTEROL SULFATE (2.5 MG/3ML) 0.083% IN NEBU
1.2500 mg | INHALATION_SOLUTION | Freq: Three times a day (TID) | RESPIRATORY_TRACT | Status: DC
  Administered 2013-04-04: 21:00:00 via RESPIRATORY_TRACT
  Filled 2013-04-04: qty 3

## 2013-04-04 MED ORDER — HEPARIN SODIUM (PORCINE) 1000 UNIT/ML IJ SOLN
INTRAMUSCULAR | Status: AC
Start: 2013-04-04 — End: 2013-04-04
  Filled 2013-04-04: qty 2

## 2013-04-04 MED ORDER — METOPROLOL SUCCINATE ER 50 MG PO TB24
50.0000 mg | ORAL_TABLET | Freq: Every day | ORAL | Status: DC
Start: 2013-04-04 — End: 2013-04-04
  Filled 2013-04-04: qty 1

## 2013-04-04 MED ORDER — MOMETASONE FURO-FORMOTEROL FUM 100-5 MCG/ACT IN AERO
2.0000 | INHALATION_SPRAY | Freq: Two times a day (BID) | RESPIRATORY_TRACT | Status: DC
  Administered 2013-04-04 – 2013-04-05 (×2): 2 via RESPIRATORY_TRACT
  Filled 2013-04-04: qty 8.8

## 2013-04-04 MED ORDER — GLYCOPYRROLATE 0.2 MG/ML IJ SOLN
INTRAMUSCULAR | Status: DC | PRN
  Administered 2013-04-04: .8 mg via INTRAVENOUS

## 2013-04-04 MED ORDER — ARTIFICIAL TEARS OP OINT
TOPICAL_OINTMENT | OPHTHALMIC | Status: AC
  Filled 2013-04-04: qty 3.5

## 2013-04-04 MED ORDER — LACTATED RINGERS IV SOLN
INTRAVENOUS | Status: DC | PRN
  Administered 2013-04-04: 08:00:00 via INTRAVENOUS

## 2013-04-04 MED ORDER — TAMSULOSIN HCL 0.4 MG PO CAPS
0.4000 mg | ORAL_CAPSULE | Freq: Every day | ORAL | Status: DC
  Administered 2013-04-04 – 2013-04-05 (×2): 0.4 mg via ORAL
  Filled 2013-04-04 (×2): qty 1

## 2013-04-04 MED ORDER — METOPROLOL TARTRATE 50 MG PO TABS
ORAL_TABLET | ORAL | Status: AC
  Administered 2013-04-04: 50 mg
  Filled 2013-04-04: qty 1

## 2013-04-04 MED ORDER — ONDANSETRON HCL 4 MG/2ML IJ SOLN
4.0000 mg | Freq: Four times a day (QID) | INTRAMUSCULAR | Status: DC | PRN
Start: 2013-04-04 — End: 2013-04-05

## 2013-04-04 MED ORDER — FAMOTIDINE 20 MG PO TABS
20.0000 mg | ORAL_TABLET | Freq: Two times a day (BID) | ORAL | Status: DC
  Administered 2013-04-04 – 2013-04-05 (×3): 20 mg via ORAL
  Filled 2013-04-04 (×5): qty 1

## 2013-04-04 MED ORDER — BISACODYL 5 MG PO TBEC
10.0000 mg | DELAYED_RELEASE_TABLET | Freq: Every day | ORAL | Status: DC
  Administered 2013-04-04: 10 mg via ORAL
  Filled 2013-04-04: qty 2

## 2013-04-04 MED ORDER — SODIUM CHLORIDE 0.9 % IR SOLN
Status: DC
  Filled 2013-04-04: qty 2

## 2013-04-04 MED ORDER — ALBUTEROL SULFATE 1.25 MG/3ML IN NEBU: 1.0000 | INHALATION_SOLUTION | Freq: Three times a day (TID) | RESPIRATORY_TRACT | Status: DC

## 2013-04-04 MED ORDER — SIMVASTATIN 20 MG PO TABS
20.0000 mg | ORAL_TABLET | Freq: Every day | ORAL | Status: DC
  Administered 2013-04-04: 20 mg via ORAL
  Filled 2013-04-04 (×2): qty 1

## 2013-04-04 MED ORDER — ENALAPRIL MALEATE 10 MG PO TABS
10.0000 mg | ORAL_TABLET | Freq: Every day | ORAL | Status: DC
  Administered 2013-04-04 – 2013-04-05 (×2): 10 mg via ORAL
  Filled 2013-04-04 (×2): qty 1

## 2013-04-04 MED ORDER — ONDANSETRON HCL 4 MG/2ML IJ SOLN
INTRAMUSCULAR | Status: DC | PRN
  Administered 2013-04-04: 4 mg via INTRAVENOUS

## 2013-04-04 SURGICAL SUPPLY — 38 items
BAG BANDED W/RUBBER/TAPE 36X54 (MISCELLANEOUS) ×3 IMPLANT
BAG EQP BAND 135X91 W/RBR TAPE (MISCELLANEOUS) ×1
BLADE STERNUM SYSTEM 6 (BLADE) ×3 IMPLANT
BNDG ADH 5X4 AIR PERM ELC (GAUZE/BANDAGES/DRESSINGS)
BNDG COHESIVE 4X5 WHT NS (GAUZE/BANDAGES/DRESSINGS) IMPLANT
CANISTER SUCTION 2500CC (MISCELLANEOUS) ×3 IMPLANT
COVER DOME SNAP 22 D (MISCELLANEOUS) ×3 IMPLANT
COVER TABLE BACK 60X90 (DRAPES) ×3 IMPLANT
DRAPE CARDIOVASCULAR INCISE (DRAPES) ×3
DRAPE SRG 135X102X78XABS (DRAPES) ×1 IMPLANT
DRSG OPSITE 6X11 MED (GAUZE/BANDAGES/DRESSINGS) ×3 IMPLANT
ELECT REM PT RETURN 9FT ADLT (ELECTROSURGICAL) ×6
ELECTRODE REM PT RTRN 9FT ADLT (ELECTROSURGICAL) ×2 IMPLANT
GAUZE PACKING IODOFORM 1 (PACKING) ×3 IMPLANT
GAUZE SPONGE 4X4 16PLY XRAY LF (GAUZE/BANDAGES/DRESSINGS) IMPLANT
GLOVE BIOGEL PI IND STRL 7.5 (GLOVE) ×1 IMPLANT
GLOVE BIOGEL PI IND STRL 8 (GLOVE) ×2 IMPLANT
GLOVE BIOGEL PI INDICATOR 7.5 (GLOVE) ×2
GLOVE BIOGEL PI INDICATOR 8 (GLOVE) ×4
GLOVE ECLIPSE 8.0 STRL XLNG CF (GLOVE) ×3 IMPLANT
GOWN STRL REUS W/ TWL LRG LVL3 (GOWN DISPOSABLE) IMPLANT
GOWN STRL REUS W/ TWL XL LVL3 (GOWN DISPOSABLE) ×1 IMPLANT
GOWN STRL REUS W/TWL LRG LVL3 (GOWN DISPOSABLE)
GOWN STRL REUS W/TWL XL LVL3 (GOWN DISPOSABLE) ×3
KIT ROOM TURNOVER OR (KITS) ×3 IMPLANT
PAD ARMBOARD 7.5X6 YLW CONV (MISCELLANEOUS) ×6 IMPLANT
PAD ELECT DEFIB RADIOL ZOLL (MISCELLANEOUS) ×3 IMPLANT
PAD ONESTEP ZOLL R SERIES ADT (MISCELLANEOUS) ×3 IMPLANT
SPONGE GAUZE 4X4 12PLY (GAUZE/BANDAGES/DRESSINGS) ×3 IMPLANT
SUT PROLENE 2 0 SH DA (SUTURE) ×3 IMPLANT
TAPE CLOTH SURG 4X10 WHT LF (GAUZE/BANDAGES/DRESSINGS) ×3 IMPLANT
TOWEL OR 17X24 6PK STRL BLUE (TOWEL DISPOSABLE) ×6 IMPLANT
TOWEL OR 17X26 10 PK STRL BLUE (TOWEL DISPOSABLE) ×6 IMPLANT
TRAY FOLEY CATH 16FRSI W/METER (SET/KITS/TRAYS/PACK) ×3 IMPLANT
TRAY FOLEY IC TEMP SENS 14FR (CATHETERS) ×6 IMPLANT
TUBE CONNECTING 12'X1/4 (SUCTIONS)
TUBE CONNECTING 12X1/4 (SUCTIONS) IMPLANT
YANKAUER SUCT BULB TIP NO VENT (SUCTIONS) IMPLANT

## 2013-04-04 NOTE — Anesthesia Preprocedure Evaluation (Signed)
Anesthesia Evaluation  Patient identified by MRN, date of birth, ID band Patient awake    Reviewed: Allergy & Precautions, H&P , NPO status , Patient's Chart, lab work & pertinent test results  Airway Mallampati: II TM Distance: >3 FB Neck ROM: Full    Dental  (+) Edentulous Upper, Edentulous Lower   Pulmonary former smoker,  breath sounds clear to auscultation        Cardiovascular hypertension, Rhythm:Regular Rate:Normal     Neuro/Psych    GI/Hepatic   Endo/Other  diabetes  Renal/GU      Musculoskeletal   Abdominal   Peds  Hematology   Anesthesia Other Findings   Reproductive/Obstetrics                           Anesthesia Physical Anesthesia Plan  ASA: III  Anesthesia Plan: General   Post-op Pain Management:    Induction: Intravenous  Airway Management Planned: Oral ETT  Additional Equipment:   Intra-op Plan:   Post-operative Plan: Extubation in OR  Informed Consent: I have reviewed the patients History and Physical, chart, labs and discussed the procedure including the risks, benefits and alternatives for the proposed anesthesia with the patient or authorized representative who has indicated his/her understanding and acceptance.     Plan Discussed with: CRNA and Anesthesiologist  Anesthesia Plan Comments: (Non-ischemic cardiomyopathy EF 35% AICD with pocket infections Type 2 DM glucose 97 Htn Renal insufficiency Cr 1.6  Plan GA with art line oral ETT  Kipp Brood, MD)        Anesthesia Quick Evaluation

## 2013-04-04 NOTE — Telephone Encounter (Signed)
Patient seen by Dr Ladona Ridgel on 04/01/13.   See note

## 2013-04-04 NOTE — Anesthesia Postprocedure Evaluation (Signed)
  Anesthesia Post-op Note  Patient: Robert Davies  Procedure(s) Performed: Procedure(s): PACEMAKER LEAD REMOVAL (N/A)  Patient Location: PACU  Anesthesia Type:General  Level of Consciousness: awake, alert  and oriented  Airway and Oxygen Therapy: Patient Spontanous Breathing and Patient connected to nasal cannula oxygen  Post-op Pain: mild  Post-op Assessment: Post-op Vital signs reviewed, Patient's Cardiovascular Status Stable, Respiratory Function Stable, Patent Airway, No signs of Nausea or vomiting and Pain level controlled  Post-op Vital Signs: stable  Complications: No apparent anesthesia complications

## 2013-04-04 NOTE — CV Procedure (Signed)
Extraction of a BiV PM carried out without immediate complication. [id number].

## 2013-04-04 NOTE — Interval H&P Note (Signed)
History and Physical Interval Note:  04/04/2013 7:28 AM  Robert Davies  has presented today for surgery, with the diagnosis of infected pacemaker  The various methods of treatment have been discussed with the patient and family. After consideration of risks, benefits and other options for treatment, the patient has consented to  Procedure(s): PACEMAKER LEAD REMOVAL (N/A) as a surgical intervention .  The patient's history has been reviewed, patient examined, no change in status, stable for surgery.  I have reviewed the patient's chart and labs.  Questions were answered to the patient's satisfaction.     [name]

## 2013-04-04 NOTE — H&P (View-Only) (Signed)
HPI Robert Davies returns today for an unscheduled followup. He is an 80-year-old man with a history of symptomatic bradycardia, hypertension, and chronic systolic heart failure, status post biventricular pacemaker insertion. He was hospitalized several months ago with appendicitis, and underwent surgery followed by rehabilitation. He improved. Since then, he has had gradual enlargement of his PM pocket. He denies chest pain or shortness of breath or abdominal pain or fevers or chills. No syncope. He admits to dietary indiscretion. His PPM pocket is not sore or red. 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  Allergies  Allergen Reactions  . Adhesive [Tape]     Break out     Current Outpatient Prescriptions  Medication Sig Dispense Refill  . albuterol (ACCUNEB) 1.25 MG/3ML nebulizer solution Take 1 ampule by nebulization 3 (three) times daily.       . bisacodyl (BISACODYL) 5 MG EC tablet Take 10 mg by mouth at bedtime.      . colchicine 0.6 MG tablet Take 0.6 mg by mouth daily.      . Dorzolamide HCl-Timolol Mal (COSOPT OP) Apply 1 drop to eye 2 (two) times daily.       . enalapril (VASOTEC) 10 MG tablet Take 1 tablet (10 mg total) by mouth daily.  30 tablet  6  . famotidine (PEPCID) 20 MG tablet Take 20 mg by mouth 2 (two) times daily.       . fluticasone (FLONASE) 50 MCG/ACT nasal spray Place 2 sprays into the nose daily.      . Fluticasone-Salmeterol (ADVAIR DISKUS) 250-50 MCG/DOSE AEPB Inhale 1 puff into the lungs 2 (two) times daily.        . furosemide (LASIX) 40 MG tablet Take 40 mg by mouth daily.      . glimepiride (AMARYL) 2 MG tablet Take 2 mg by mouth 2 (two) times daily.        . metoprolol (TOPROL-XL) 50 MG 24 hr tablet Take 50 mg by mouth daily.        . potassium chloride (KLOR-CON) 10 MEQ CR tablet Take 10 mEq by mouth 2 (two) times daily.        . simvastatin (ZOCOR) 20 MG tablet Take 20 mg by mouth at  bedtime.        . sitaGLIPtin (JANUVIA) 50 MG tablet Take 50 mg by mouth daily.      . Tamsulosin HCl (FLOMAX) 0.4 MG CAPS Take 0.4 mg by mouth daily.        . tiotropium (SPIRIVA) 18 MCG inhalation capsule Place 18 mcg into inhaler and inhale daily.      . warfarin (COUMADIN) 5 MG tablet Take 5 mg by mouth daily.        No current facility-administered medications for this visit.     Past Medical History  Diagnosis Date  . Emphysema     Oxygen-dependent  . Chronic systolic heart failure   . Implantable cardiac defibrillator infection     July 2012  . ICD (implantable cardiac defibrillator) in place     CRT St Jude; remote - yes  . Nonischemic cardiomyopathy     a. Catheterization 2005 no obstructive coronary disease;  b. 02/2009 Echo: EF 35-40%, Gr 2 DD, mild lvh.  . HLD (hyperlipidemia)   . Essential hypertension, benign   . Myocardial infarction 1990's  . Peptic ulcer   . GI bleeding   . BPH (benign prostatic hypertrophy)   .   DM (diabetes mellitus)   . Coronary atherosclerosis of native coronary artery     Nonobstructive  . Shortness of breath   . Automatic implantable cardioverter-defibrillator in situ   . CHF (congestive heart failure)   . Chronic kidney disease     ROS:   All systems reviewed and negative except as noted in the HPI.   Past Surgical History  Procedure Laterality Date  . Adenoidectomy    . Doppler echocardiography  2005, 2011  . Cardiac defibrillator placement  11/27/10    "this is my 3rd defibrillator"  . Cervical disc surgery  1990's    "slipped"  . Laceration repair  ~ 1939    right leg  . Tonsillectomy  ~ 1940  . Insert / replace / remove pacemaker  11/27/10    "this is my third one"  . Eye surgery    . Cataract extraction      "left eye"  . Laparoscopic appendectomy N/A 08/16/2012    Procedure: APPENDECTOMY LAPAROSCOPIC;  Surgeon: Liz Malady, MD;  Location: [facility] OR;  Service: General;  Laterality: N/A;  . Appendectomy  08/16/2012      Family History  Problem Relation Age of Onset  . Cancer Father   . Cancer Mother     Throat  . Lung cancer Daughter      History   Social History  . Marital Status: Widowed    Spouse Name: N/A    Number of Children: N/A  . Years of Education: N/A   Occupational History  . Not on file.   Social History Main Topics  . Smoking status: Former Smoker -- 2.00 packs/day    Types: Cigarettes    Quit date: 06/20/2011  . Smokeless tobacco: Never Used     Comment: started at age 89; smoked 1-2.5 ppd; quit 11/11  . Alcohol Use: No  . Drug Use: No  . Sexual Activity: Not on file   Other Topics Concern  . Not on file   Social History Narrative   Divorced, retired Naval architect.      There were no vitals taken for this visit.  Physical Exam:  Well appearing 80 year old man,NAD HEENT: Unremarkable Neck:  7 cm JVD, no thyromegally Back:  No CVA tenderness Lungs:  Clear except for scattered basilar rales, no wheezes, no rhonchi. HEART:  Regular rate rhythm, no murmurs, no rubs, no clicks Abd:  soft, obese, positive bowel sounds, no organomegally, no rebound, no guarding Ext:  2 plus pulses, no edema, no cyanosis, no clubbing Skin:  No rashes no nodules Neuro:  CN II through XII intact, motor grossly intact   DEVICE  Normal device function.  See PaceArt for details.   Assess/Plan:

## 2013-04-04 NOTE — Op Note (Signed)
NAME:  Robert Davies, LOOMIS NO.:  [id number]  MEDICAL RECORD NO.:  [id number]  LOCATION:  2W06C                        FACILITY:  [facility]  PHYSICIAN:  Doylene Canning. Ladona Ridgel, MD    DATE OF BIRTH:  03-Aug-1930  DATE OF PROCEDURE:  04/04/2013 DATE OF DISCHARGE:                              OPERATIVE REPORT   PROCEDURE PERFORMED:  Extraction of a biventricular pacing system.  INDICATION:  Pacemaker pocket infection.  INTRODUCTION:  The patient is a very pleasant 80 year old man who underwent insertion of a biventricular pacemaker in the setting of a nonischemic cardiomyopathy, approximately 3 years ago.  He initially did well, but over the last several months, he has noted swelling in his pacemaker pocket.  He has had no fevers or chills.  No drainage and no tenderness or erythema at the pocket site.  He is now referred for extraction of this clearly infected pacemaker system.  PROCEDURE:  After informed consent was obtained, the patient was taken to the operating room in the fasting state.  The Anesthesia Service was utilized to provide general anesthesia for an invasive arterial monitoring.  After the usual preparation and draping, and the appropriate time called, a 7-cm incision was carried out over the old pacemaker insertion site and electrocautery was utilized to enter the pacemaker pocket.  Approximately 100 mL of purulent material was removed.  Electrocautery was utilized to dissect down to the generator itself and the generator was removed with gentle traction from the pocket and disconnected from the atrial RV and LV leads.  The patient did not have pacemaker dependence.  The rest of the purulent material was removed from the pocket.  At this point, the stylet was advanced into the left ventricular lead and gentle traction was placed on the lead and it was removed in total.  Next, a 46-cm stylet was inserted into the atrial lead and it was in the helix of the lead was  retracted and the atrial lead was also removed with gentle traction.  Finally, attention was turned to the right ventricular lead.  A 52-cm stylet was then inserted into the ventricular lead and the lead helix retracted back into the body and gentle traction was placed on the lead.  This lead had some increased resistance to lead removal but after a period of time, the lead was removed in total.  There were no hemodynamic sequelae.  At this point, fluoroscopy was utilized to demonstrate the pericardial silhouette with PACs but not enlarged.  The pocket was debrided extensively and electrocautery was utilized to assure hemostasis.  The pocket was irrigated with antibiotic irrigation and packed with an 1-inch iodoform gauze.  The incision was closed with multiple mattress Prolene suture.  Pressure dressing was placed and the patient was returned to the recovery area in satisfactory condition.  COMPLICATIONS:  There were no immediate procedure complications.  RESULTS:  This demonstrate successful extraction of a St. Jude biventricular pacemaker in a patient with pacemaker pocket infection without immediate procedure complications.     Doylene Canning. Ladona Ridgel, MD     GWT/MEDQ  D:  04/04/2013  T:  04/04/2013  Job:  [id number]

## 2013-04-04 NOTE — Transfer of Care (Signed)
Immediate Anesthesia Transfer of Care Note  Patient: Robert Davies  Procedure(s) Performed: Procedure(s): PACEMAKER LEAD REMOVAL (N/A)  Patient Location: PACU  Anesthesia Type:General  Level of Consciousness: alert  and oriented  Airway & Oxygen Therapy: Patient Spontanous Breathing and Patient connected to face mask oxygen  Post-op Assessment: Report given to PACU RN, Post -op Vital signs reviewed and stable and Patient moving all extremities X 4  Post vital signs: Reviewed and stable  Complications: No apparent anesthesia complications

## 2013-04-04 NOTE — Telephone Encounter (Signed)
Finally reached pt Friday night 7:45pm, 04/01/13. Reviewed instructions for extraction procedure Monday. Pt had pre procedure lab work Friday. Patient verbalized understanding and agreeable to plan.    Wound check appt not made due to not in office at time of reaching patient by phone. I will forward to Dr. Lubertha Basque nurse to arrange.

## 2013-04-05 ENCOUNTER — Ambulatory Visit (HOSPITAL_COMMUNITY): Payer: Medicare HMO

## 2013-04-05 ENCOUNTER — Encounter (HOSPITAL_COMMUNITY): Payer: Self-pay | Admitting: Internal Medicine

## 2013-04-05 LAB — TYPE AND SCREEN
ABO/RH(D): O POS
ANTIBODY SCREEN: NEGATIVE
Unit division: 0
Unit division: 0
Unit division: 0
Unit division: 0

## 2013-04-05 LAB — PROTIME-INR
INR: 1.53 — ABNORMAL HIGH (ref 0.00–1.49)
PROTHROMBIN TIME: 18 s — AB (ref 11.6–15.2)

## 2013-04-05 MED ORDER — FUROSEMIDE 40 MG PO TABS: ORAL_TABLET | ORAL | Status: AC

## 2013-04-05 MED ORDER — ALBUTEROL SULFATE (2.5 MG/3ML) 0.083% IN NEBU: 2.5000 mg | INHALATION_SOLUTION | Freq: Three times a day (TID) | RESPIRATORY_TRACT | Status: DC

## 2013-04-05 MED ORDER — ALBUTEROL SULFATE (2.5 MG/3ML) 0.083% IN NEBU
INHALATION_SOLUTION | RESPIRATORY_TRACT | Status: AC
  Administered 2013-04-05: 2.5 mg
  Filled 2013-04-05: qty 3

## 2013-04-05 NOTE — Discharge Summary (Signed)
ELECTROPHYSIOLOGY DISCHARGE SUMMARY   Patient ID: Robert Davies,  MRN: [id number], DOB/AGE: 1930-11-12 80 y.o.  Admit date: 04/04/2013 Discharge date: 04/05/2013  Primary Care Physician: Georgann Housekeeper, MD Primary EP: Ladona Ridgel, MD / Graciela Husbands, MD  Primary Discharge Diagnosis:  1. CRT-P pocket infection s/p system extraction yesterday   Secondary Discharge Diagnoses:  1. NICM with chronic systolic HF 2. History of bradycardia 3. COPD 4. HTN 5. Dyslipidemia 6. Nonobstructive CAD 7. CKD 8. PUD with history GI bleed 9. DM 10. BPH  Procedures This Admission:  1. Extraction of BiV PPM 04/04/2013 RESULTS: This demonstrates successful extraction of a St. Jude  biventricular pacemaker in a patient with pacemaker pocket infection  without immediate procedure complications.  History and Hospital Course:  [name] is a pleasant 80 year old man with NICM and chronic systolic HF who underwent insertion of a biventricular pacemaker approximately 3 years ago. He was hospitalized several months ago with appendicitis and underwent surgery followed by rehabilitation. He improved. Since then, he has noticed swelling and gradual enlargement of his pacemaker pocket. He denies chest pain or shortness of breath. No abdominal pain, fever or chills. No syncope. No drainage. His PPM pocket is not sore or red. 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 He is now referred for extraction of this clearly infected pacemaker system. On 04/04/2013 he underwent BiV PPM system extraction. [name] tolerated this procedure well without any immediate complication. He remains hemodynamically stable and afebrile. His explant site is intact without significant bleeding or hematoma. He has been given discharge instructions including wound care and activity restrictions. He will follow-up in the office tomorrow for wound check and dressing  change then again on Friday for repeat wound check and labs (BMET and Coumadin follow-up). He has been seen, examined and deemed stable for discharge today by Dr. Lewayne Bunting.  Discharge Vitals: Blood pressure 130/83, pulse 79, temperature 98.9 F (37.2 C), temperature source Oral, resp. rate 18, height 5\' 6"  (1.676 m), weight 199 lb 15.3 oz (90.7 kg), SpO2 92.00%.   Labs: Lab Results  Component Value Date   WBC 6.1 04/04/2013   HGB 12.3* 04/04/2013   HCT 38.5* 04/04/2013   MCV 90.6 04/04/2013   PLT 183 04/04/2013    Recent Labs Lab 04/01/13 1259 04/04/13 0643  NA 140 145  K 3.9 4.3  CL 100 108  CO2 32 24  BUN 21 27*  CREATININE 1.6* 1.39*  CALCIUM 9.3 8.8  PROT 8.0  --   BILITOT 0.8  --   ALKPHOS 43  --   ALT 22  --   AST 25  --   GLUCOSE 105* 102*   Recent Labs  04/05/13 0547  INR 1.53*    Disposition:  The patient is being discharged in stable condition.  Follow-up: Follow-up Information   Follow up with [facility] On 04/06/2013. (At 9:00 AM for wound check)    Specialty:  Cardiology   Contact information:   [address] [place] [address] [phone]      Follow up with [facility] On 04/08/2013. (At 2:30 PM for Coumadin follow-up and lab (BMET))    Specialty:  Cardiology   Contact information:   [address], [address] [place] [address] [phone]     Discharge Medications:    Medication List  ADVAIR DISKUS 250-50 MCG/DOSE Aepb  Generic drug:  Fluticasone-Salmeterol  Inhale 1 puff into the lungs 2 (two) times daily.     albuterol 1.25 MG/3ML nebulizer solution  Commonly known as:  ACCUNEB  Take 1 ampule by nebulization 3 (three) times daily.     bisacodyl 5 MG EC tablet  Generic drug:  bisacodyl  Take 10 mg by mouth at bedtime.     colchicine 0.6 MG tablet  Take 0.6 mg by mouth daily.     COSOPT OP  Apply 1 drop to eye 2 (two) times daily.     enalapril 10  MG tablet  Commonly known as:  VASOTEC  Take 1 tablet (10 mg total) by mouth daily.     famotidine 20 MG tablet  Commonly known as:  PEPCID  Take 20 mg by mouth 2 (two) times daily.     FLOMAX 0.4 MG Caps capsule  Generic drug:  tamsulosin  Take 0.4 mg by mouth daily.     fluticasone 50 MCG/ACT nasal spray  Commonly known as:  FLONASE  Place 2 sprays into the nose daily.     furosemide 40 MG tablet  Commonly known as:  LASIX  Take 60 mg (1.5 tablets) in the morning and 40 mg (1 tablet) in the evening.     glimepiride 2 MG tablet  Commonly known as:  AMARYL  Take 2 mg by mouth 2 (two) times daily.     hydrOXYzine 25 MG tablet  Commonly known as:  ATARAX/VISTARIL  Take 25 mg by mouth at bedtime as needed for anxiety.     iron polysaccharides 150 MG capsule  Commonly known as:  NIFEREX  Take 150 mg by mouth 2 (two) times daily.     metoprolol succinate 50 MG 24 hr tablet  Commonly known as:  TOPROL-XL  Take 50 mg by mouth daily.     pantoprazole 40 MG tablet  Commonly known as:  PROTONIX  Take 40 mg by mouth daily.     polyethylene glycol packet  Commonly known as:  MIRALAX / GLYCOLAX  Take 17 g by mouth daily.     potassium chloride 10 MEQ CR tablet  Commonly known as:  KLOR-CON  Take 10 mEq by mouth 2 (two) times daily.     simvastatin 20 MG tablet  Commonly known as:  ZOCOR  Take 20 mg by mouth at bedtime.     sitaGLIPtin 50 MG tablet  Commonly known as:  JANUVIA  Take 50 mg by mouth daily.     tiotropium 18 MCG inhalation capsule  Commonly known as:  SPIRIVA  Place 18 mcg into inhaler and inhale daily.     traMADol 50 MG tablet  Commonly known as:  ULTRAM  Take 50 mg by mouth every 6 (six) hours as needed for moderate pain.     triamcinolone cream 0.1 %  Commonly known as:  KENALOG  Apply 1 application topically 2 (two) times daily.     warfarin 5 MG tablet  Commonly known as:  COUMADIN  Take 5 mg by mouth daily.       Duration of Discharge  Encounter: Greater than 30 minutes including physician time.  Signed, Rick DuffDMISTEN, Enyla Lisbon, PA-C 04/05/2013, 9:27 AM

## 2013-04-05 NOTE — Progress Notes (Signed)
Patient: Robert Davies Date of Encounter: 04/05/2013, 8:57 AM Admit date: 04/04/2013     Subjective  [name] reports soreness at explant site but is otherwise without complaint. He denies CP or SOB.   Objective  Physical Exam: Vitals: BP 130/83  Pulse 79  Temp(Src) 98.9 F (37.2 C) (Oral)  Resp 18  Ht 5\' 6"  (1.676 m)  Wt 199 lb 15.3 oz (90.7 kg)  BMI 32.29 kg/m2  SpO2 92% General: Well developed, well appearing 80 year old male in no acute distress. Neck: Supple. JVD not elevated. Lungs: Clear bilaterally to auscultation without wheezes, rales, or rhonchi. Breathing is unlabored. Heart: RRR S1 S2 without murmurs, rubs, or gallops.  Abdomen: Soft, non-distended. Extremities: No clubbing or cyanosis. No edema.  Distal pedal pulses are 2+ and equal bilaterally. Neuro: Alert and oriented X 3. Moves all extremities spontaneously. No focal deficits. Skin: Right upper chest / explant site well approximated with sutures and iodoform gauze in place, moderate serosanguinous drainage.   Intake/Output:  Intake/Output Summary (Last 24 hours) at 04/05/13 0857 Last data filed at 04/05/13 [id number]  Gross per 24 hour  Intake   1240 ml  Output    575 ml  Net    665 ml    Inpatient Medications:  . albuterol  1.25 mg Nebulization TID  . bisacodyl  10 mg Oral QHS  . colchicine  0.6 mg Oral Daily  . dorzolamide-timolol  1 drop Both Eyes BID  . enalapril  10 mg Oral Daily  . famotidine  20 mg Oral BID  . furosemide  40 mg Oral BID  . glimepiride  2 mg Oral BID WC  . iron polysaccharides  150 mg Oral BID  . linagliptin  5 mg Oral Daily  . metoprolol succinate  50 mg Oral Daily  . mometasone-formoterol  2 puff Inhalation BID  . pantoprazole  40 mg Oral Daily  . polyethylene glycol  17 g Oral Daily  . potassium chloride  10 mEq Oral Daily  . simvastatin  20 mg Oral QHS  . tamsulosin  0.4 mg Oral Daily  . tiotropium  18 mcg Inhalation Daily  . warfarin  5 mg Oral q1800  . Warfarin  - Physician Dosing Inpatient   Does not apply q1800    Labs:  Recent Labs  04/04/13 0643  NA 145  K 4.3  CL 108  CO2 24  GLUCOSE 102*  BUN 27*  CREATININE 1.39*  CALCIUM 8.8    Recent Labs  04/04/13 0643  WBC 6.1  HGB 12.3*  HCT 38.5*  MCV 90.6  PLT 183    Recent Labs  04/05/13 0547  INR 1.53*    Radiology/Studies: Dg Chest 2 View  04/05/2013   CLINICAL DATA:  Pacemaker extraction.  EXAM: CHEST  2 VIEW  COMPARISON:  August 16, 2012.  FINDINGS: The heart size and mediastinal contours are within normal limits. Both lungs are clear. No pneumothorax or pleural effusion is noted. Right-sided pacemaker has been removed. The visualized skeletal structures are unremarkable.  IMPRESSION: No acute cardiopulmonary abnormality seen.   Electronically Signed   By: Roque LiasJames  Green M.D.   On: 04/05/2013 08:23   Telemetry: SR with occasional PVCs   Assessment and Plan  1. CRT-P pocket infection s/p system extraction yesterday 2. NICM with chronic systolic HF [name] is doing well post CRT-P explant yesterday. He is afebrile and hemodynamically stable. His wound is intact. Plan for DC home today after ambulation.  Signed, Exie Parody  EP Attending  Patient seen and examined. Agree with above.  [name]

## 2013-04-05 NOTE — Discharge Instructions (Signed)
° °  Supplemental Discharge Instructions folllowing  Device Removal  Activity No heavy lifting or vigorous activity with your left/right arm for 2 weeks.       NO DRIVING for 1 week; you may begin driving on 93/73/4287. WOUND CARE   Keep the wound area clean and dry.  Do not get this area wet (no shower or tub bath) until given clearance by Dr. Ladona Ridgel.   DO  NOT apply any creams, oils, or ointments to the wound area.   If you notice any drainage or discharge from the wound, any swelling or bruising at the site, or you develop a fever > 101? F after you are discharged home, call the office at once.

## 2013-04-06 ENCOUNTER — Ambulatory Visit: Payer: Commercial Managed Care - HMO | Admitting: *Deleted

## 2013-04-06 DIAGNOSIS — I429 Cardiomyopathy, unspecified: Secondary | ICD-10-CM

## 2013-04-06 DIAGNOSIS — I509 Heart failure, unspecified: Secondary | ICD-10-CM

## 2013-04-06 DIAGNOSIS — T827XXA Infection and inflammatory reaction due to other cardiac and vascular devices, implants and grafts, initial encounter: Secondary | ICD-10-CM

## 2013-04-06 NOTE — Progress Notes (Addendum)
Patient presents to the office for a wound check w/GT s/p CRT explant. GT drained blood from site and pressure dressing was applied. Incision site appeared healthy. Patient will follow up on Friday (3/20) for a wound recheck.

## 2013-04-07 LAB — MDC_IDC_ENUM_SESS_TYPE_INCLINIC
Battery Voltage: 2.92 V
Brady Statistic RA Percent Paced: 29 %
Implantable Pulse Generator Serial Number: 2699993
Lead Channel Impedance Value: 400 Ohm
Lead Channel Impedance Value: 450 Ohm
Lead Channel Impedance Value: 610 Ohm
Lead Channel Pacing Threshold Amplitude: 0.875 V
Lead Channel Pacing Threshold Amplitude: 2.375 V
Lead Channel Pacing Threshold Pulse Width: 0.5 ms
Lead Channel Pacing Threshold Pulse Width: 1 ms
Lead Channel Sensing Intrinsic Amplitude: 7.3 mV
Lead Channel Setting Pacing Amplitude: 2 V
Lead Channel Setting Pacing Amplitude: 3 V
Lead Channel Setting Sensing Sensitivity: 2 mV
MDC IDC MSMT LEADCHNL RA SENSING INTR AMPL: 4.1 mV
MDC IDC SET LEADCHNL LV PACING PULSEWIDTH: 1 ms
MDC IDC SET LEADCHNL RA PACING AMPLITUDE: 2 V
MDC IDC SET LEADCHNL RV PACING PULSEWIDTH: 0.5 ms
MDC IDC STAT BRADY RV PERCENT PACED: 89 %

## 2013-04-08 ENCOUNTER — Ambulatory Visit (INDEPENDENT_AMBULATORY_CARE_PROVIDER_SITE_OTHER): Payer: Commercial Managed Care - HMO | Admitting: *Deleted

## 2013-04-08 DIAGNOSIS — I429 Cardiomyopathy, unspecified: Secondary | ICD-10-CM

## 2013-04-08 NOTE — Progress Notes (Signed)
Pressure dressing removed.  Wound without any further edema or bleeding.  Sutures intact.  Follow up 1 week to have them removed.

## 2013-04-12 ENCOUNTER — Ambulatory Visit: Payer: Medicare Other | Admitting: Cardiology

## 2013-04-12 NOTE — Progress Notes (Signed)
This encounter was created in error - please disregard.

## 2013-04-15 ENCOUNTER — Ambulatory Visit (INDEPENDENT_AMBULATORY_CARE_PROVIDER_SITE_OTHER): Payer: Commercial Managed Care - HMO | Admitting: *Deleted

## 2013-04-15 DIAGNOSIS — T827XXA Infection and inflammatory reaction due to other cardiac and vascular devices, implants and grafts, initial encounter: Secondary | ICD-10-CM

## 2013-04-18 NOTE — Progress Notes (Signed)
Patient presents to the office for a wound recheck and stitch removal. Incision appeared to be well healed without redness. Two stitches were removed without complication. Fluid was noted in the pocket--patient w/o c/o. SK attempted to drain the area, but was unsuccessful. Patient will follow up with the device clinic on 3-31 for a wound recheck and further stitch removal w/GT.

## 2013-04-19 ENCOUNTER — Ambulatory Visit: Payer: Commercial Managed Care - HMO

## 2013-04-21 ENCOUNTER — Ambulatory Visit (INDEPENDENT_AMBULATORY_CARE_PROVIDER_SITE_OTHER): Payer: Commercial Managed Care - HMO | Admitting: *Deleted

## 2013-04-21 DIAGNOSIS — I429 Cardiomyopathy, unspecified: Secondary | ICD-10-CM

## 2013-04-21 DIAGNOSIS — I509 Heart failure, unspecified: Secondary | ICD-10-CM

## 2013-04-21 NOTE — Progress Notes (Signed)
Wound recheck today.  Remaining sutures removed.  Pocket continues to be enlarged with blood.  No redness noted. Dr. Ladona Ridgel aware.  ROV in 2 weeks for a recheck.

## 2013-05-05 ENCOUNTER — Ambulatory Visit (INDEPENDENT_AMBULATORY_CARE_PROVIDER_SITE_OTHER): Payer: Commercial Managed Care - HMO | Admitting: *Deleted

## 2013-05-05 DIAGNOSIS — I428 Other cardiomyopathies: Secondary | ICD-10-CM

## 2013-05-05 NOTE — Progress Notes (Signed)
Wound recheck for fluid in the pocket.  The pocket appears to be smaller and is soft to touch.  Incision well healed.  Per Dr. Ladona Ridgel, ROV in 1 month for a recheck.

## 2013-05-17 IMAGING — US US SOFT TISSUE HEAD/NECK
1 series · 14 of 25 positions shown · non-contrast
Comparison: None.

CLINICAL DATA: Mass at the angle of the right side of the mandible.

ULTRASOUND OF HEAD/NECK SOFT TISSUES
TECHNIQUE: Ultrasound examination of the head and neck soft
tissues was performed in the area of clinical concern.

[Series 1: us soft tissue head/neck · 0.07mm/px · 31 acquisitions, 14 frames shown]
[im 1/31]
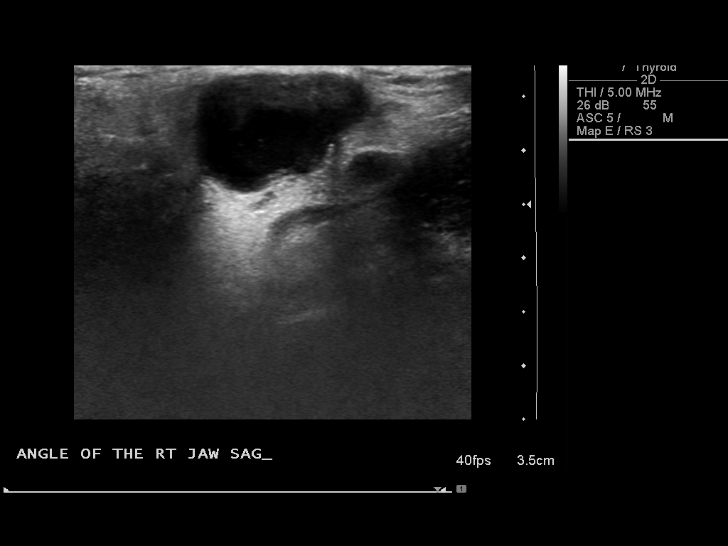
[im 3/31]
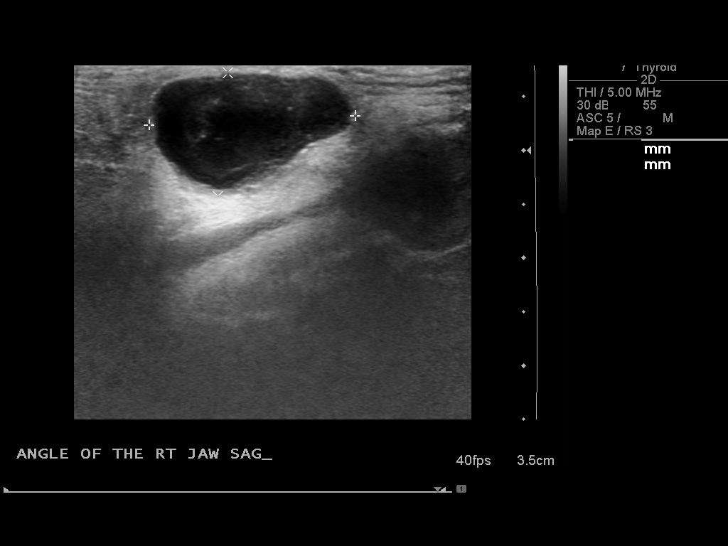
[im 6/31]
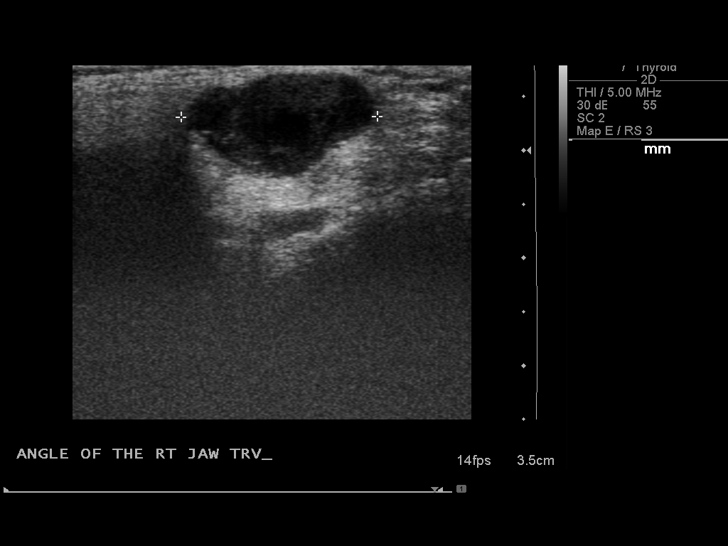
[im 8/31]
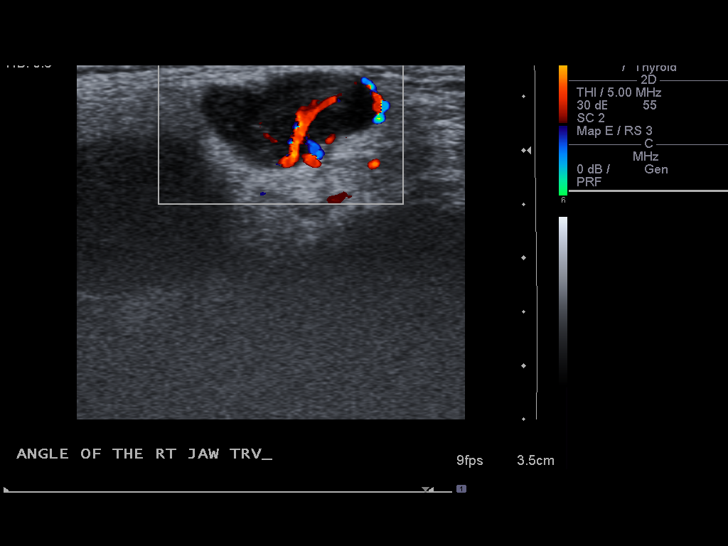
[im 11/31]
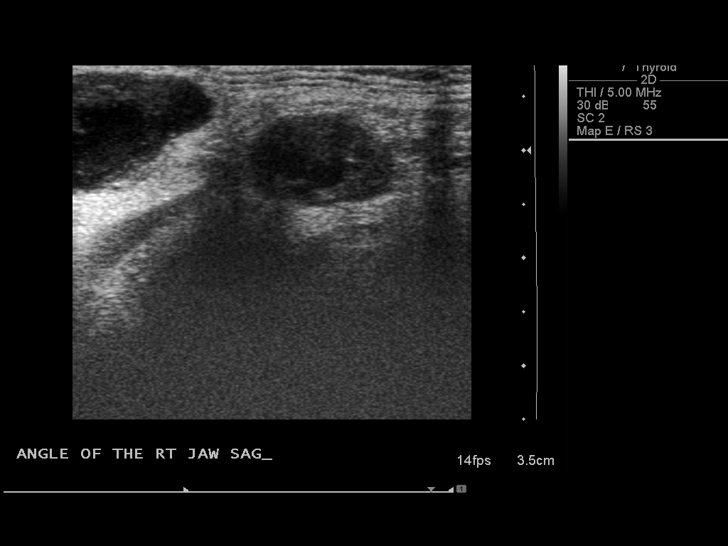
[im 12/31]
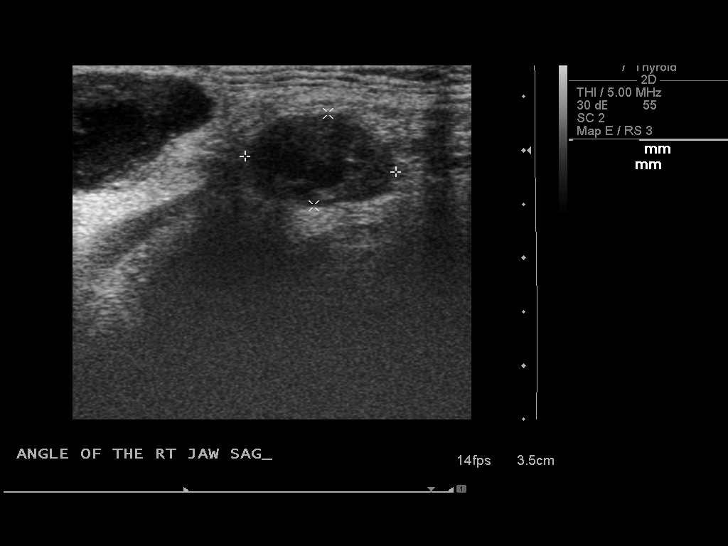
[im 14/31]
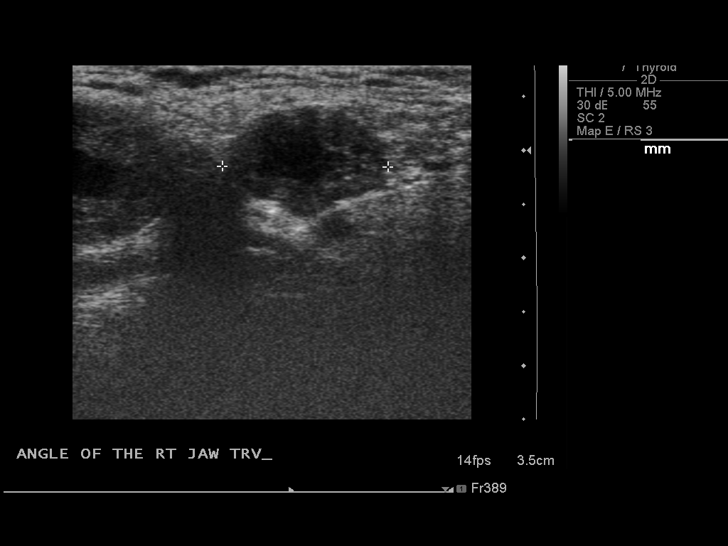
[im 17/31]
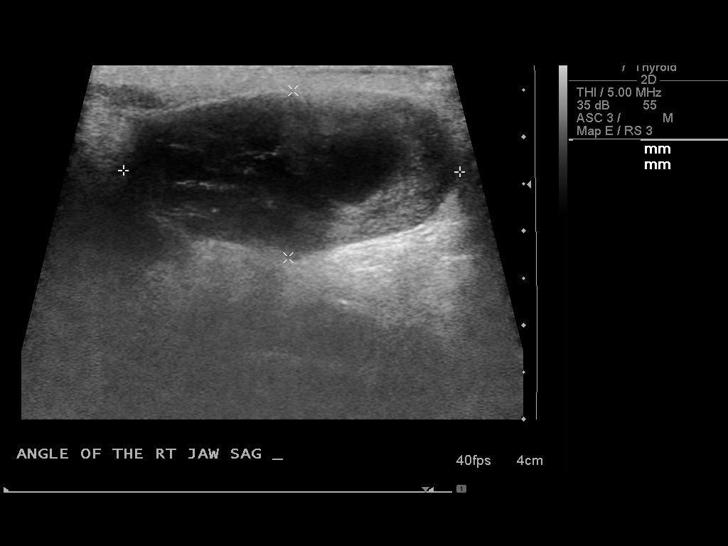
[im 19/31]
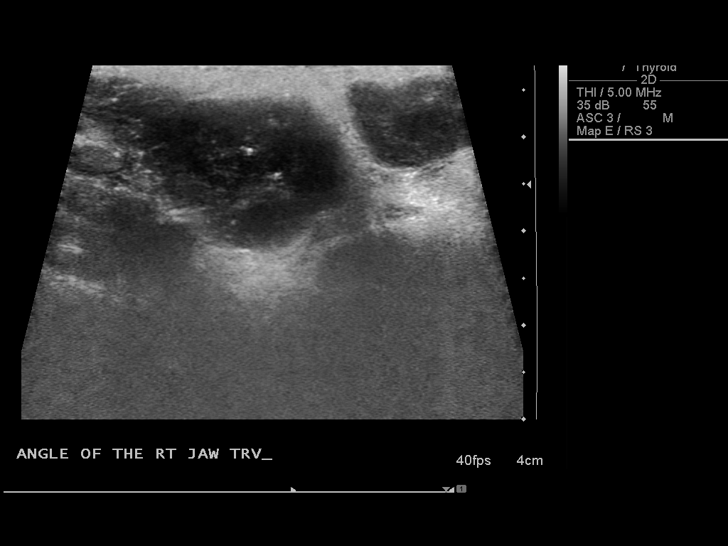
[im 21/31]
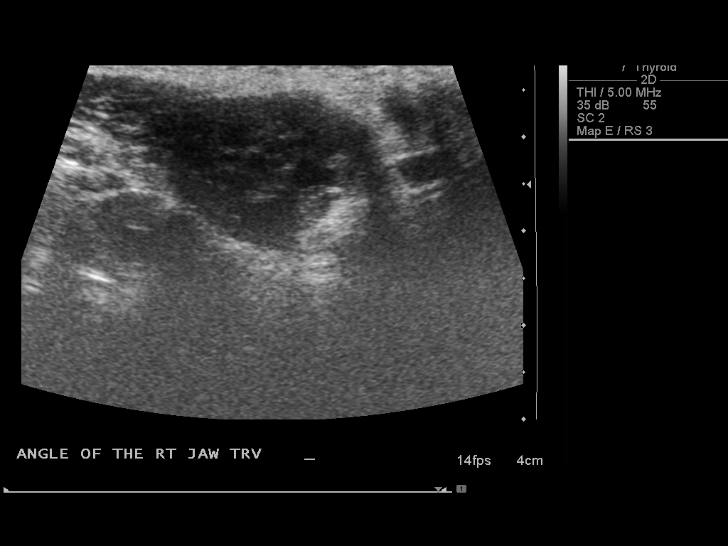
[im 23/31]
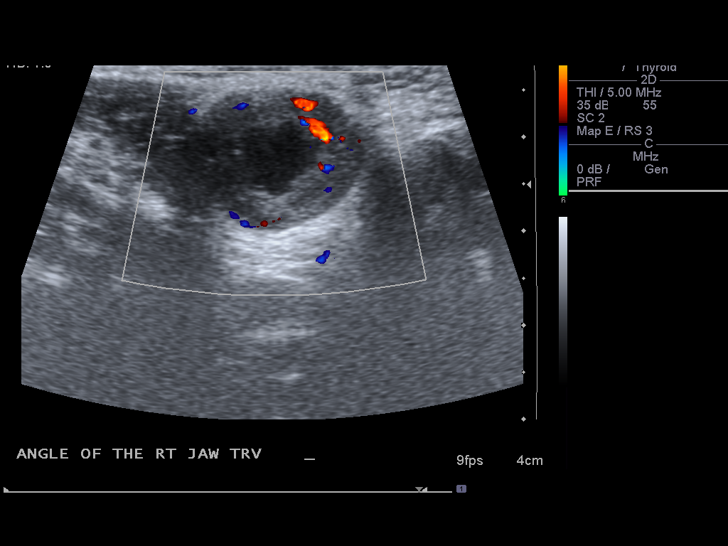
[im 26/31]
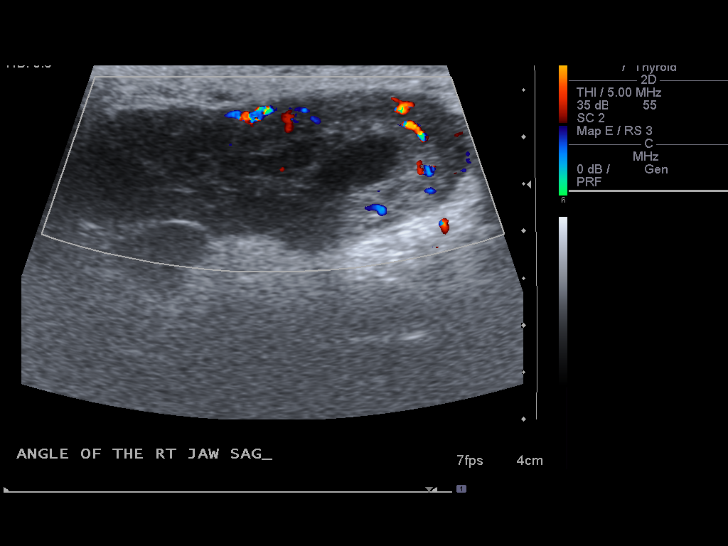
[im 28/31]
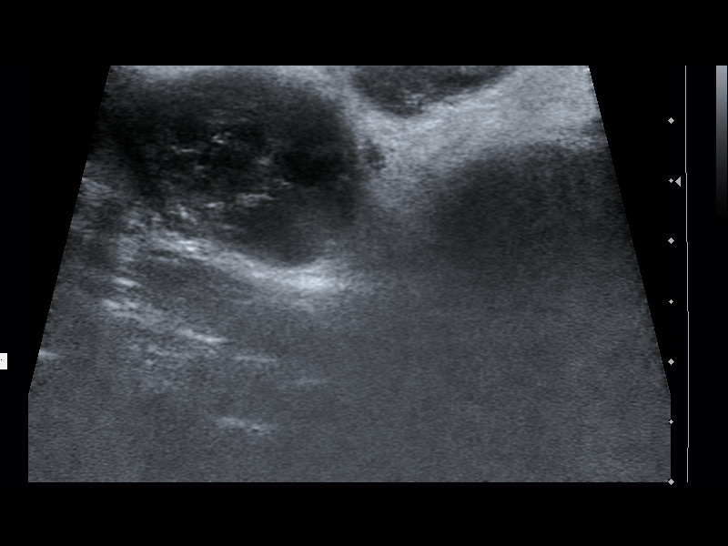
[im 31/31]
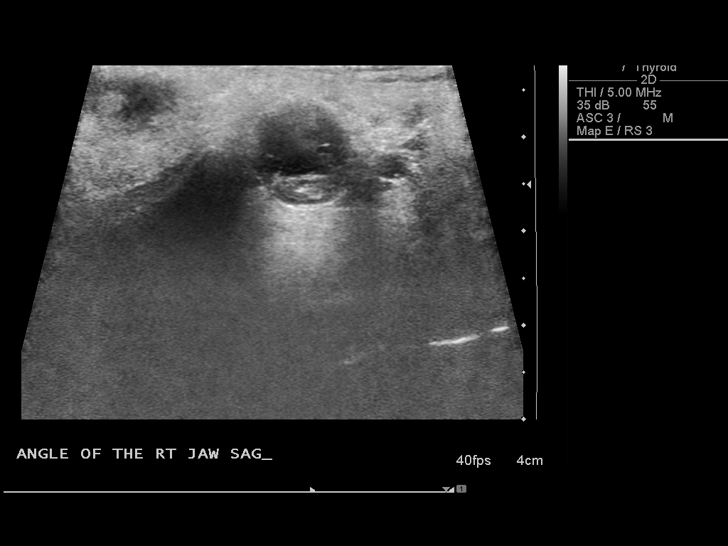

[14 of 25 positions shown; findings below may reference images not displayed]

FINDINGS: There are three necrotic appearing masses at the angle of
the mandible on the right, one measuring 3.6 cm in size, one
measuring 1.5 cm in size and the other measuring 1.9 cm in size.
The appearance is consistent with necrotic lymph nodes. Malignancy
should be considered.
IMPRESSION: Necrotic lymph nodes at the angle of the mandible on the right. The
finding is worrisome for malignancy.

## 2013-06-02 ENCOUNTER — Ambulatory Visit (INDEPENDENT_AMBULATORY_CARE_PROVIDER_SITE_OTHER): Payer: Commercial Managed Care - HMO | Admitting: *Deleted

## 2013-06-02 DIAGNOSIS — I509 Heart failure, unspecified: Secondary | ICD-10-CM

## 2013-06-02 DIAGNOSIS — I428 Other cardiomyopathies: Secondary | ICD-10-CM

## 2013-06-07 NOTE — Progress Notes (Signed)
Wound recheck--S/P CRT explant on 3-16. Site appears well healed without redness or edema. GT observed. Patient will follow up with GT in 3 months.

## 2013-09-05 ENCOUNTER — Encounter: Payer: Commercial Managed Care - HMO | Admitting: Internal Medicine

## 2013-09-06 ENCOUNTER — Encounter: Payer: Self-pay | Admitting: Internal Medicine

## 2013-09-06 ENCOUNTER — Ambulatory Visit (INDEPENDENT_AMBULATORY_CARE_PROVIDER_SITE_OTHER): Payer: Commercial Managed Care - HMO | Admitting: Internal Medicine

## 2013-09-06 VITALS — BP 118/60 | HR 64 | Ht 66.0 in | Wt 204.0 lb

## 2013-09-06 DIAGNOSIS — R0602 Shortness of breath: Secondary | ICD-10-CM

## 2013-09-06 DIAGNOSIS — J9610 Chronic respiratory failure, unspecified whether with hypoxia or hypercapnia: Secondary | ICD-10-CM

## 2013-09-06 DIAGNOSIS — J9611 Chronic respiratory failure with hypoxia: Secondary | ICD-10-CM

## 2013-09-06 DIAGNOSIS — I5022 Chronic systolic (congestive) heart failure: Secondary | ICD-10-CM

## 2013-09-06 DIAGNOSIS — R0902 Hypoxemia: Secondary | ICD-10-CM

## 2013-09-06 NOTE — Assessment & Plan Note (Signed)
His chronic dyspnea is multifactorial. I have discussed the treatment options. He will continue his diuretic therapy, taking lasix 60 mg daily. He has no evidence of excess fluid. I do not appreciate rales today.

## 2013-09-06 NOTE — Patient Instructions (Addendum)
Your physician recommends that you continue on your current medications as directed. Please refer to the Current Medication list given to you today.  Your physician wants you to follow-up in: 6 MONTHS WITH DR. Ladona Ridgel. You will receive a reminder letter in the mail two months in advance. If you don't receive a letter, please call our office to schedule the follow-up appointment.  You have been referred to [facility] PULMONOLOGIST PATRICK WRIGHT.  Smoking Cessation Quitting smoking is important to your health and has many advantages. However, it is not always easy to quit since nicotine is a very addictive drug. Oftentimes, people try 3 times or more before being able to quit. This document explains the best ways for you to prepare to quit smoking. Quitting takes hard work and a lot of effort, but you can do it. ADVANTAGES OF QUITTING SMOKING  You will live longer, feel better, and live better.  Your body will feel the impact of quitting smoking almost immediately.  Within 20 minutes, blood pressure decreases. Your pulse returns to its normal level.  After 8 hours, carbon monoxide levels in the blood return to normal. Your oxygen level increases.  After 24 hours, the chance of having a heart attack starts to decrease. Your breath, hair, and body stop smelling like smoke.  After 48 hours, damaged nerve endings begin to recover. Your sense of taste and smell improve.  After 72 hours, the body is virtually free of nicotine. Your bronchial tubes relax and breathing becomes easier.  After 2 to 12 weeks, lungs can hold more air. Exercise becomes easier and circulation improves.  The risk of having a heart attack, stroke, cancer, or lung disease is greatly reduced.  After 1 year, the risk of coronary heart disease is cut in half.  After 5 years, the risk of stroke falls to the same as a nonsmoker.  After 10 years, the risk of lung cancer is cut in half and the risk of other cancers decreases  significantly.  After 15 years, the risk of coronary heart disease drops, usually to the level of a nonsmoker.  If you are pregnant, quitting smoking will improve your chances of having a healthy baby.  The people you live with, especially any children, will be healthier.  You will have extra money to spend on things other than cigarettes. QUESTIONS TO THINK ABOUT BEFORE ATTEMPTING TO QUIT You may want to talk about your answers with your health care provider.  Why do you want to quit?  If you tried to quit in the past, what helped and what did not?  What will be the most difficult situations for you after you quit? How will you plan to handle them?  Who can help you through the tough times? Your family? Friends? A health care provider?  What pleasures do you get from smoking? What ways can you still get pleasure if you quit? Here are some questions to ask your health care provider:  How can you help me to be successful at quitting?  What medicine do you think would be best for me and how should I take it?  What should I do if I need more help?  What is smoking withdrawal like? How can I get information on withdrawal? GET READY  Set a quit date.  Change your environment by getting rid of all cigarettes, ashtrays, matches, and lighters in your home, car, or work. Do not let people smoke in your home.  Review your past attempts to  quit. Think about what worked and what did not. GET SUPPORT AND ENCOURAGEMENT You have a better chance of being successful if you have help. You can get support in many ways.  Tell your family, friends, and coworkers that you are going to quit and need their support. Ask them not to smoke around you.  Get individual, group, or telephone counseling and support. Programs are available at [facility] and [facility]. Call your local health department for information about programs in your area.  Spiritual beliefs and practices may help some  smokers quit.  Download a "quit meter" on your computer to keep track of quit statistics, such as how long you have gone without smoking, cigarettes not smoked, and money saved.  Get a self-help book about quitting smoking and staying off tobacco. LEARN NEW SKILLS AND BEHAVIORS  Distract yourself from urges to smoke. Talk to someone, go for a walk, or occupy your time with a task.  Change your normal routine. Take a different route to work. Drink tea instead of coffee. Eat breakfast in a different place.  Reduce your stress. Take a hot bath, exercise, or read a book.  Plan something enjoyable to do every day. Reward yourself for not smoking.  Explore interactive web-based programs that specialize in helping you quit. GET MEDICINE AND USE IT CORRECTLY Medicines can help you stop smoking and decrease the urge to smoke. Combining medicine with the above behavioral methods and support can greatly increase your chances of successfully quitting smoking.  Nicotine replacement therapy helps deliver nicotine to your body without the negative effects and risks of smoking. Nicotine replacement therapy includes nicotine gum, lozenges, inhalers, nasal sprays, and skin patches. Some may be available over-the-counter and others require a prescription.  Antidepressant medicine helps people abstain from smoking, but how this works is unknown. This medicine is available by prescription.  Nicotinic receptor partial agonist medicine simulates the effect of nicotine in your brain. This medicine is available by prescription. Ask your health care provider for advice about which medicines to use and how to use them based on your health history. Your health care provider will tell you what side effects to look out for if you choose to be on a medicine or therapy. Carefully read the information on the package. Do not use any other product containing nicotine while using a nicotine replacement product.  RELAPSE OR  DIFFICULT SITUATIONS Most relapses occur within the first 3 months after quitting. Do not be discouraged if you start smoking again. Remember, most people try several times before finally quitting. You may have symptoms of withdrawal because your body is used to nicotine. You may crave cigarettes, be irritable, feel very hungry, cough often, get headaches, or have difficulty concentrating. The withdrawal symptoms are only temporary. They are strongest when you first quit, but they will go away within 10-14 days. To reduce the chances of relapse, try to:  Avoid drinking alcohol. Drinking lowers your chances of successfully quitting.  Reduce the amount of caffeine you consume. Once you quit smoking, the amount of caffeine in your body increases and can give you symptoms, such as a rapid heartbeat, sweating, and anxiety.  Avoid smokers because they can make you want to smoke.  Do not let weight gain distract you. Many smokers will gain weight when they quit, usually less than 10 pounds. Eat a healthy diet and stay active. You can always lose the weight gained after you quit.  Find ways to improve your mood  other than smoking. FOR MORE INFORMATION  www.smokefree.gov  Document Released: 12/31/2000 Document Revised: 05/23/2013 Document Reviewed: 04/17/2011 [facility] Patient Information 2015 [place], [place]. This information is not intended to replace advice given to you by your health care provider. Make sure you discuss any questions you have with your health care provider.

## 2013-09-06 NOTE — Progress Notes (Signed)
HPI Robert Davies returns today for followup. He is an 80 year old man with a history of symptomatic bradycardia, COPD, hypertension, and chronic systolic heart failure, status post biventricular pacemaker insertion.  He developed PM pocket infection and underwent extraction. He has had no symptomatic bradycardia. He has progressive sob and is on chronic home oxygen, now at 4 liters. His oxygen sat today is 4 liters. He is not actively followed by a pulmonologist. He still smokes an occaisional cigarette.  He denies peripheral edema.  Allergies  Allergen Reactions  . Adhesive [Tape]     Break out  . Vicodin [Hydrocodone-Acetaminophen] Rash     Current Outpatient Prescriptions  Medication Sig Dispense Refill  . albuterol (ACCUNEB) 1.25 MG/3ML nebulizer solution Take 1 ampule by nebulization 3 (three) times daily.       . bisacodyl (BISACODYL) 5 MG EC tablet Take 10 mg by mouth at bedtime.      . colchicine 0.6 MG tablet Take 0.6 mg by mouth daily.      . Dorzolamide HCl-Timolol Mal (COSOPT OP) Apply 1 drop to eye 2 (two) times daily.       . enalapril (VASOTEC) 10 MG tablet Take 1 tablet (10 mg total) by mouth daily.  30 tablet  6  . famotidine (PEPCID) 20 MG tablet Take 20 mg by mouth 2 (two) times daily.       . fluticasone (FLONASE) 50 MCG/ACT nasal spray Place 2 sprays into the nose daily.      . Fluticasone-Salmeterol (ADVAIR DISKUS) 250-50 MCG/DOSE AEPB Inhale 1 puff into the lungs 2 (two) times daily.        . furosemide (LASIX) 40 MG tablet Take 60 mg (1.5 tablets) in the morning and 40 mg (1 tablet) in the evening.  45 tablet  3  . glimepiride (AMARYL) 2 MG tablet Take 2 mg by mouth 2 (two) times daily.       . hydrOXYzine (ATARAX/VISTARIL) 25 MG tablet Take 25 mg by mouth at bedtime as needed for anxiety.      . iron polysaccharides (NIFEREX) 150 MG capsule Take 150 mg by mouth 2 (two) times daily.      . metoprolol (TOPROL-XL) 50 MG 24 hr tablet Take 50 mg by mouth daily.        .  pantoprazole (PROTONIX) 40 MG tablet Take 40 mg by mouth daily.      . polyethylene glycol (MIRALAX / GLYCOLAX) packet Take 17 g by mouth daily.      . potassium chloride (KLOR-CON) 10 MEQ CR tablet Take 10 mEq by mouth 2 (two) times daily.        . simvastatin (ZOCOR) 20 MG tablet Take 20 mg by mouth at bedtime.        . sitaGLIPtin (JANUVIA) 50 MG tablet Take 50 mg by mouth daily.      . Tamsulosin HCl (FLOMAX) 0.4 MG CAPS Take 0.4 mg by mouth daily.        Marland Kitchen tiotropium (SPIRIVA) 18 MCG inhalation capsule Place 18 mcg into inhaler and inhale daily.      . traMADol (ULTRAM) 50 MG tablet Take 50 mg by mouth every 6 (six) hours as needed for moderate pain.      Marland Kitchen triamcinolone cream (KENALOG) 0.1 % Apply 1 application topically 2 (two) times daily.      Marland Kitchen warfarin (COUMADIN) 5 MG tablet Take 5 mg by mouth daily.        No current facility-administered medications for this  visit.     Past Medical History  Diagnosis Date  . Emphysema     Oxygen-dependent  . Chronic systolic heart failure   . Implantable cardiac defibrillator infection     July 2012; 03/2013  . Nonischemic cardiomyopathy     a. Catheterization 2005 no obstructive coronary disease;  b. 02/2009 Echo: EF 35-40%, Gr 2 DD, mild lvh.  Marland Kitchen. HLD (hyperlipidemia)   . Essential hypertension, benign   . Myocardial infarction 1990's  . Peptic ulcer   . GI bleeding   . BPH (benign prostatic hypertrophy)   . DM (diabetes mellitus)   . Coronary atherosclerosis of native coronary artery     Nonobstructive  . Shortness of breath   . CHF (congestive heart failure)   . Chronic kidney disease     ROS:   All systems reviewed and negative except as noted in the HPI.   Past Surgical History  Procedure Laterality Date  . Adenoidectomy    . Doppler echocardiography  2005, 2011  . Cardiac defibrillator placement  11/27/10    "this is my 3rd defibrillator"  . Cervical disc surgery  1990's    "slipped"  . Laceration repair  ~ 1939     right leg  . Tonsillectomy  ~ 1940  . Eye surgery    . Cataract extraction      "left eye"  . Laparoscopic appendectomy N/A 08/16/2012    Procedure: APPENDECTOMY LAPAROSCOPIC;  Surgeon: Liz MaladyBurke E Thompson, MD;  [facility];  Service: General;  Laterality: N/A;  . Appendectomy  08/16/2012  . Pacemaker lead removal N/A 04/04/2013    Procedure: PACEMAKER LEAD REMOVAL;  Surgeon: Marinus MawGregg W Daleisa Halperin, MD;  [facility];  Service: Cardiovascular;  Laterality: N/A;     Family History  Problem Relation Age of Onset  . Cancer Father   . Cancer Mother     Throat  . Lung cancer Daughter      History   Social History  . Marital Status: Widowed    Spouse Name: N/A    Number of Children: N/A  . Years of Education: N/A   Occupational History  . Not on file.   Social History Main Topics  . Smoking status: Current Some Day Smoker -- 2.00 packs/day    Types: Cigarettes    Last Attempt to Quit: 06/20/2011  . Smokeless tobacco: Never Used     Comment: started at age 80; smoked 1-2.5 ppd; quit 11/11  . Alcohol Use: No  . Drug Use: No  . Sexual Activity: Not on file   Other Topics Concern  . Not on file   Social History Narrative   Divorced, retired [employer] architecttruck driver.      BP 118/60  Pulse 64  Ht 5\' 6"  (1.676 m)  Wt 204 lb (92.534 kg)  BMI 32.94 kg/m2  Physical Exam: Oxygen sat on 4 liters 85%. Chronically ill appearing 80 year old man,NAD HEENT: Unremarkable Neck:  7 cm JVD, no thyromegally Back:  No CVA tenderness Lungs:  Clear except for scattered basilar rales, no wheezes, no rhonchi. HEART:  Regular rate rhythm, no murmurs, no rubs, no clicks Abd:  soft, obese, positive bowel sounds, no organomegally, no rebound, no guarding Ext:  2 plus pulses, no edema, no cyanosis, no clubbing Skin:  No rashes no nodules Neuro:  CN II through XII intact, motor grossly intact    Assess/Plan:

## 2013-09-06 NOTE — Assessment & Plan Note (Signed)
He is desaturated despite home oxygen. I suspect there is very little that is reversible. I have asked the patient to folowup with Dr. Delford Field who has seen him remotely. Unclear if his dyspnea is all copd but he does not appear to be fluid overloaded.

## 2013-09-10 ENCOUNTER — Other Ambulatory Visit: Payer: Self-pay | Admitting: Internal Medicine

## 2013-09-19 ENCOUNTER — Ambulatory Visit (INDEPENDENT_AMBULATORY_CARE_PROVIDER_SITE_OTHER): Payer: Commercial Managed Care - HMO | Admitting: Interventional Cardiology

## 2013-09-19 ENCOUNTER — Encounter: Payer: Self-pay | Admitting: Interventional Cardiology

## 2013-09-19 VITALS — BP 122/78 | HR 72 | Ht 67.0 in | Wt 204.0 lb

## 2013-09-19 DIAGNOSIS — I429 Cardiomyopathy, unspecified: Secondary | ICD-10-CM

## 2013-09-19 DIAGNOSIS — I5022 Chronic systolic (congestive) heart failure: Secondary | ICD-10-CM

## 2013-09-19 DIAGNOSIS — I209 Angina pectoris, unspecified: Secondary | ICD-10-CM | POA: Diagnosis not present

## 2013-09-19 DIAGNOSIS — J449 Chronic obstructive pulmonary disease, unspecified: Secondary | ICD-10-CM

## 2013-09-19 DIAGNOSIS — I6789 Other cerebrovascular disease: Secondary | ICD-10-CM

## 2013-09-19 DIAGNOSIS — E785 Hyperlipidemia, unspecified: Secondary | ICD-10-CM

## 2013-09-19 MED ORDER — NITROGLYCERIN 0.4 MG SL SUBL: 0.4000 mg | SUBLINGUAL_TABLET | SUBLINGUAL | Status: AC | PRN

## 2013-09-19 NOTE — Progress Notes (Signed)
Patient ID: Robert Davies, male   DOB: October 22, 1930, 80 y.o.   MRN: [id number]    [address], [address] [place], [place]  [address] Phone: [phone] Fax:  [phone]  Date:  09/19/2013   ID:  Robert Davies, DOB 01-27-1930, MRN [id number]  PCP:  Georgann Housekeeper, MD   ASSESSMENT:  1. Angina pectoris, recurrent over the past 2-3 years. He has had perhaps 3-5 episodes each lasted less than 30 minutes. He can't remember when the last episode occurred. Coronary angiography in 2005 was unremarkable 2. Nonischemic cardiomyopathy, most recent LVEF 40%, 2014 echo: [place] 3. Severe COPD with chronic oxygen requirement 4. Chronic kidney disease, stage III 5. History of AICD pocket infection x2 bilateral subclavian areas with most recent explant 2013  PLAN:  1. Given the patient's severe comorbidities, especially COPD with chronic oxygen dependency, we will treat episodes of angina with sublingual nitroglycerin. No ischemic evaluation seems indicated at this time as symptoms have been stable over several years 2. If recurring episodes become more frequent than one per week he should return for further evaluation which may include an ischemic evaluation.   SUBJECTIVE: Robert Davies is a 80 y.o. male who is doing well today. He is referred by Dr. Eula Listen because of recurrent chest tightness. The story is vague. He states that over the past 3-4 years he has had 4 or 5 episodes of fairly severe substernal chest tightness. Episodes last up to 30 minutes. It is quite severe when it occurs. Last episode was several months ago. He has had none recently. He does not sleep in his bed because he gets short of breath. He has not noted lower extremity swelling.   Wt Readings from Last 3 Encounters:  09/19/13 204 lb (92.534 kg)  09/06/13 204 lb (92.534 kg)  04/05/13 199 lb 15.3 oz (90.7 kg)     Past Medical History  Diagnosis Date  . Emphysema     Oxygen-dependent  . Chronic systolic  heart failure   . Implantable cardiac defibrillator infection     July 2012; 03/2013  . Nonischemic cardiomyopathy     a. Catheterization 2005 no obstructive coronary disease;  b. 02/2009 Echo: EF 35-40%, Gr 2 DD, mild lvh.  Marland Kitchen HLD (hyperlipidemia)   . Essential hypertension, benign   . Myocardial infarction 1990's  . Peptic ulcer   . GI bleeding   . BPH (benign prostatic hypertrophy)   . DM (diabetes mellitus)   . Coronary atherosclerosis of native coronary artery     Nonobstructive  . Shortness of breath   . CHF (congestive heart failure)   . Chronic kidney disease     Current Outpatient Prescriptions  Medication Sig Dispense Refill  . albuterol (ACCUNEB) 1.25 MG/3ML nebulizer solution Take 1 ampule by nebulization 3 (three) times daily.       . bisacodyl (BISACODYL) 5 MG EC tablet Take 10 mg by mouth at bedtime.      . colchicine 0.6 MG tablet Take 0.6 mg by mouth daily.      . Dorzolamide HCl-Timolol Mal (COSOPT OP) Apply 1 drop to eye 2 (two) times daily.       . enalapril (VASOTEC) 10 MG tablet Take 1 tablet (10 mg total) by mouth daily.  30 tablet  6  . famotidine (PEPCID) 20 MG tablet Take 20 mg by mouth 2 (two) times daily.       . fluticasone (FLONASE) 50 MCG/ACT nasal spray Place 2 sprays into  the nose daily.      . Fluticasone-Salmeterol (ADVAIR DISKUS) 250-50 MCG/DOSE AEPB Inhale 1 puff into the lungs 2 (two) times daily.        . furosemide (LASIX) 40 MG tablet Take 60 mg (1.5 tablets) in the morning and 40 mg (1 tablet) in the evening.  45 tablet  3  . glimepiride (AMARYL) 2 MG tablet Take 2 mg by mouth 2 (two) times daily.       . hydrOXYzine (ATARAX/VISTARIL) 25 MG tablet Take 25 mg by mouth at bedtime as needed for anxiety.      . iron polysaccharides (NIFEREX) 150 MG capsule Take 150 mg by mouth 2 (two) times daily.      . metoprolol (TOPROL-XL) 50 MG 24 hr tablet Take 50 mg by mouth daily.        . pantoprazole (PROTONIX) 40 MG tablet Take 40 mg by mouth daily.        . polyethylene glycol (MIRALAX / GLYCOLAX) packet Take 17 g by mouth daily.      . potassium chloride (KLOR-CON) 10 MEQ CR tablet Take 10 mEq by mouth 2 (two) times daily.        . simvastatin (ZOCOR) 20 MG tablet Take 20 mg by mouth at bedtime.        . sitaGLIPtin (JANUVIA) 50 MG tablet Take 50 mg by mouth daily.      . Tamsulosin HCl (FLOMAX) 0.4 MG CAPS Take 0.4 mg by mouth daily.        Marland Kitchen tiotropium (SPIRIVA) 18 MCG inhalation capsule Place 18 mcg into inhaler and inhale daily.      . traMADol (ULTRAM) 50 MG tablet Take 50 mg by mouth every 6 (six) hours as needed for moderate pain.      Marland Kitchen triamcinolone cream (KENALOG) 0.1 % Apply 1 application topically 2 (two) times daily.      Marland Kitchen warfarin (COUMADIN) 5 MG tablet Take 5 mg by mouth daily.        No current facility-administered medications for this visit.    Allergies:    Allergies  Allergen Reactions  . Adhesive [Tape]     Break out  . Vicodin [Hydrocodone-Acetaminophen] Rash    Social History:  The patient  reports that he has been smoking Cigarettes.  He has been smoking about 2.00 packs per day. He has never used smokeless tobacco. He reports that he does not drink alcohol or use illicit drugs.   ROS:  Please see the history of present illness.   No syncope. Exertional tolerance is limited. Continues to smoke cigarettes. On multiple medications.   All other systems reviewed and negative.   OBJECTIVE: VS:  BP 122/78  Pulse 72  Ht 5\' 7"  (1.702 m)  Wt 204 lb (92.534 kg)  BMI 31.94 kg/m2 Well nourished, well developed, in no acute distress, younger than stated age HEENT: normal Neck: JVD elevated. Carotid bruit absent  Cardiac:  normal S1, S2; RRR; no murmur Lungs:  clear to auscultation bilaterally, no wheezing, rhonchi or rales Abd: soft, nontender, no hepatomegaly Ext: Edema absent. Pulses 2+ Skin: warm and dry Neuro:  CNs 2-12 intact, no focal abnormalities noted  EKG:  Left bundle branch block        Signed, Darci Needle III, MD 09/19/2013 4:14 PM

## 2013-09-19 NOTE — Patient Instructions (Signed)
Your physician has recommended you make the following change in your medication:   1. Start Nitroglycerin 0.4 mg tablet SL as needed for chest pain.  Your physician recommends that you schedule a follow-up appointment as needed.   Call for appointment if you have to use Nitro more than once a week.  Nitroglycerin sublingual tablets What is this medicine? NITROGLYCERIN (nye troe GLI ser in) is a type of vasodilator. It relaxes blood vessels, increasing the blood and oxygen supply to your heart. This medicine is used to relieve chest pain caused by angina. It is also used to prevent chest pain before activities like climbing stairs, going outdoors in cold weather, or sexual activity. This medicine may be used for other purposes; ask your health care provider or pharmacist if you have questions. COMMON BRAND NAME(S): Nitroquick, Nitrostat, Nitrotab What should I tell my health care provider before I take this medicine? They need to know if you have any of these conditions: -anemia -head injury, recent stroke, or bleeding in the brain -liver disease -previous heart attack -an unusual or allergic reaction to nitroglycerin, other medicines, foods, dyes, or preservatives -pregnant or trying to get pregnant -breast-feeding How should I use this medicine? Take this medicine by mouth as needed. At the first sign of an angina attack (chest pain or tightness) place one tablet under your tongue. You can also take this medicine 5 to 10 minutes before an event likely to produce chest pain. Follow the directions on the prescription label. Let the tablet dissolve under the tongue. Do not swallow whole. Replace the dose if you accidentally swallow it. It will help if your mouth is not dry. Saliva around the tablet will help it to dissolve more quickly. Do not eat or drink, smoke or chew tobacco while a tablet is dissolving. If you are not better within 5 minutes after taking ONE dose of nitroglycerin, call  9-1-1 immediately to seek emergency medical care. Do not take more than 3 nitroglycerin tablets over 15 minutes. If you take this medicine often to relieve symptoms of angina, your doctor or health care professional may provide you with different instructions to manage your symptoms. If symptoms do not go away after following these instructions, it is important to call 9-1-1 immediately. Do not take more than 3 nitroglycerin tablets over 15 minutes. Talk to your pediatrician regarding the use of this medicine in children. Special care may be needed. Overdosage: If you think you have taken too much of this medicine contact a poison control center or emergency room at once. NOTE: This medicine is only for you. Do not share this medicine with others. What if I miss a dose? This does not apply. This medicine is only used as needed. What may interact with this medicine? Do not take this medicine with any of the following medications: -certain migraine medicines like ergotamine and dihydroergotamine (DHE) -medicines used to treat erectile dysfunction like sildenafil, tadalafil, and vardenafil -riociguat This medicine may also interact with the following medications: -alteplase -aspirin -heparin -medicines for high blood pressure -medicines for mental depression -other medicines used to treat angina -phenothiazines like chlorpromazine, mesoridazine, prochlorperazine, thioridazine This list may not describe all possible interactions. Give your health care provider a list of all the medicines, herbs, non-prescription drugs, or dietary supplements you use. Also tell them if you smoke, drink alcohol, or use illegal drugs. Some items may interact with your medicine. What should I watch for while using this medicine? Tell your doctor or health  care professional if you feel your medicine is no longer working. Keep this medicine with you at all times. Sit or lie down when you take your medicine to prevent  falling if you feel dizzy or faint after using it. Try to remain calm. This will help you to feel better faster. If you feel dizzy, take several deep breaths and lie down with your feet propped up, or bend forward with your head resting between your knees. You may get drowsy or dizzy. Do not drive, use machinery, or do anything that needs mental alertness until you know how this drug affects you. Do not stand or sit up quickly, especially if you are an older patient. This reduces the risk of dizzy or fainting spells. Alcohol can make you more drowsy and dizzy. Avoid alcoholic drinks. Do not treat yourself for coughs, colds, or pain while you are taking this medicine without asking your doctor or health care professional for advice. Some ingredients may increase your blood pressure. What side effects may I notice from receiving this medicine? Side effects that you should report to your doctor or health care professional as soon as possible: -blurred vision -dry mouth -skin rash -sweating -the feeling of extreme pressure in the head -unusually weak or tired Side effects that usually do not require medical attention (report to your doctor or health care professional if they continue or are bothersome): -flushing of the face or neck -headache -irregular heartbeat, palpitations -nausea, vomiting This list may not describe all possible side effects. Call your doctor for medical advice about side effects. You may report side effects to FDA at 1-800-FDA-1088. Where should I keep my medicine? Keep out of the reach of children. Store at room temperature between 20 and 25 degrees C (68 and 77 degrees F). Store in Retail buyer. Protect from light and moisture. Keep tightly closed. Throw away any unused medicine after the expiration date. NOTE: This sheet is a summary. It may not cover all possible information. If you have questions about this medicine, talk to your doctor, pharmacist, or health care  provider.  2015, [employer]. (2012-11-04 17:57:36)

## 2013-09-28 ENCOUNTER — Encounter: Payer: Self-pay | Admitting: Pulmonary Disease

## 2013-09-28 ENCOUNTER — Ambulatory Visit (INDEPENDENT_AMBULATORY_CARE_PROVIDER_SITE_OTHER): Payer: Commercial Managed Care - HMO | Admitting: Pulmonary Disease

## 2013-09-28 VITALS — BP 124/76 | HR 70 | Temp 97.9°F | Ht 67.0 in | Wt 206.0 lb

## 2013-09-28 DIAGNOSIS — J9611 Chronic respiratory failure with hypoxia: Secondary | ICD-10-CM

## 2013-09-28 DIAGNOSIS — R0902 Hypoxemia: Secondary | ICD-10-CM | POA: Diagnosis not present

## 2013-09-28 DIAGNOSIS — Z23 Encounter for immunization: Secondary | ICD-10-CM | POA: Diagnosis not present

## 2013-09-28 DIAGNOSIS — J439 Emphysema, unspecified: Secondary | ICD-10-CM

## 2013-09-28 DIAGNOSIS — J9610 Chronic respiratory failure, unspecified whether with hypoxia or hypercapnia: Secondary | ICD-10-CM | POA: Diagnosis not present

## 2013-09-28 DIAGNOSIS — J438 Other emphysema: Secondary | ICD-10-CM | POA: Diagnosis not present

## 2013-09-28 MED ORDER — IPRATROPIUM-ALBUTEROL 0.5-2.5 (3) MG/3ML IN SOLN: 3.0000 mL | Freq: Four times a day (QID) | RESPIRATORY_TRACT | Status: AC

## 2013-09-28 MED ORDER — ALBUTEROL SULFATE 108 (90 BASE) MCG/ACT IN AEPB: 2.0000 | INHALATION_SPRAY | Freq: Four times a day (QID) | RESPIRATORY_TRACT | Status: AC | PRN

## 2013-09-28 MED ORDER — BUDESONIDE 0.5 MG/2ML IN SUSP: 0.5000 mg | Freq: Two times a day (BID) | RESPIRATORY_TRACT | Status: AC

## 2013-09-28 NOTE — Patient Instructions (Signed)
Will stop advair/spiriva.  Start on duonebs thru your machine 4 times a day (take at breakfast/lunch/dinner/bedtime everyday no matter what) Add budesonide 0.5mg  to 2 of the treatments each day. You will need to have a rescue inhaler to carry with you when away from home.  Will get you prescription for proair respiclick  2 inhalations up to every 6 hrs if needed when away from home. You MUST stop smoking! Will refer back to pulmonary rehab to work on conditioning. Stay on your continuous oxygen when at home, and will stick with pulsed oxygen when away from home although I suspect it does not provide enough oxygen.  However, you will not be mobile if you have to carry tanks.  Will give you the flu shot today. followup with me again in 39mos.

## 2013-09-28 NOTE — Progress Notes (Signed)
   Subjective:    Patient ID: Robert Davies, male    DOB: Mar 30, 1930, 80 y.o.   MRN: [id number]  HPI The patient is an 80 year old male who I've been asked to see for management of COPD. I have seen the patient in the distant past, the last being around 2011.  He had moderate airflow obstruction at that time, and was treated with Advair and Spiriva along with as needed albuterol. The patient was able to stop smoking, and he can participate in pulmonary rehabilitation. He has subsequently been lost to followup, and comes back today where he has gone back to smoking, but is not participating in any type of exercise program.  He also has a history of a known cardiomyopathy with chronic congestive heart failure. He is currently using oxygen at 4 L pulsed with inactivity, and 5 L continuous by concentrator at home. He feels that his breathing has worsened over the last few years, and is now gotten to the point that he will get winded just walking through his house. Even though he is using his inhalers, he feels that he is having a difficult time getting the medication into his chest. He denies any cough, mucus, or congestion. He tells me that his weight is stable over the last few years.  A chest x-ray in March of this year showed no significant acute process.   Review of Systems  Constitutional: Negative for fever and unexpected weight change.  HENT: Positive for congestion and rhinorrhea. Negative for dental problem, ear pain, nosebleeds, postnasal drip, sinus pressure, sneezing, sore throat and trouble swallowing.   Eyes: Negative for redness and itching.  Respiratory: Positive for chest tightness and shortness of breath. Negative for cough and wheezing.   Cardiovascular: Positive for palpitations. Negative for chest pain and leg swelling.  Gastrointestinal: Negative for nausea and vomiting.  Genitourinary: Negative for dysuria.  Musculoskeletal: Negative for joint swelling.  Skin: Negative for  rash.  Neurological: Negative for headaches.  Hematological: Does not bruise/bleed easily.  Psychiatric/Behavioral: Negative for dysphoric mood. The patient is not nervous/anxious.        Objective:   Physical Exam Constitutional:  Obese male, no acute distress  HENT:  Nares patent without discharge, deviated septum to left with narrowing.  Oropharynx without exudate, palate and uvula are normal  Eyes:  Perrla, eomi, no scleral icterus  Neck:  No JVD, no TMG  Cardiovascular:  Normal rate, regular rhythm, no rubs or gallops.  No murmurs        Intact distal pulses but diminished  Pulmonary :  Decreased bs, no stridor or respiratory distress   No rales, rhonchi, or wheezing  Abdominal:  Soft, nondistended, bowel sounds present.  No tenderness noted.   Musculoskeletal:  mild lower extremity edema noted.  Lymph Nodes:  No cervical lymphadenopathy noted  Skin:  No cyanosis noted  Neurologic:  Alert, appropriate, moves all 4 extremities without obvious deficit.         Assessment & Plan:

## 2013-09-28 NOTE — Assessment & Plan Note (Signed)
The patient has borderline oxygen saturations of 90% at rest on pulsed oxygen, and I suspect that he has significant desaturation with walking. Optimally he would need continuous oxygen, but this would involve carrying a large tank everywhere, and would limit his mobility.  He has continuous flow oxygen at home, and notices the difference. For now, he would like to stick with his pulsed oxygen which will allow him to be more mobile.

## 2013-09-28 NOTE — Assessment & Plan Note (Signed)
The patient has a history of moderate COPD dating back to 2011, but unfortunately has returned to smoking. I suspect he has severe disease at this point, and I see no reason to recheck his pulmonary function studies. I think his cardiomyopathy with chronic congestive heart failure, as well as his weight and conditioning also playing a role with his dyspnea. I have stressed to him the importance of total smoking cessation, and would like to try and get him back to pulmonary rehabilitation. He is having issues getting good lung deposition with his inhalers, and we'll therefore change him over to nebulized bronchodilators.

## 2013-11-28 ENCOUNTER — Ambulatory Visit: Payer: Commercial Managed Care - HMO | Admitting: Pulmonary Disease

## 2013-11-29 ENCOUNTER — Ambulatory Visit (INDEPENDENT_AMBULATORY_CARE_PROVIDER_SITE_OTHER): Payer: Commercial Managed Care - HMO | Admitting: Pulmonary Disease

## 2013-11-29 ENCOUNTER — Encounter: Payer: Self-pay | Admitting: Pulmonary Disease

## 2013-11-29 VITALS — BP 126/78 | HR 71 | Temp 97.1°F | Ht 67.0 in | Wt 203.0 lb

## 2013-11-29 DIAGNOSIS — J438 Other emphysema: Secondary | ICD-10-CM

## 2013-11-29 DIAGNOSIS — J9611 Chronic respiratory failure with hypoxia: Secondary | ICD-10-CM

## 2013-11-29 NOTE — Assessment & Plan Note (Signed)
The patient appears to be stable from his last visit, and at least he has stopped smoking. He unfortunately misunderstood his medication directions, and has not been using budesonide. I have gone over all of this with him again, and he feels it is clear. He has still not heard from pulmonary rehabilitation, and I will call them today to find out what is going on. I stressed to him the importance of staying on his medications, staying away from cigarettes, and to work on weight loss and conditioning.

## 2013-11-29 NOTE — Patient Instructions (Signed)
Use your duoneb 4 times a day, and can take 2 extra treatments a day if having a bad day. ADD the budesonide liquid to your treatments TWICE a day everyday. Use proair for rescue only when away from home.  Will send a note to pulmonary rehab again. Let us know if you do not hear from them.  followup with me again in 50mos.

## 2013-11-29 NOTE — Progress Notes (Signed)
   Subjective:    Patient ID: Robert Davies, male    DOB: 23-Oct-1930, 80 y.o.   MRN: [id number]  HPI Patient comes in today for follow-up of his known COPD with chronic respiratory failure. At least he has stopped smoking since the last visit, but unfortunately he did not understand the directions to put budesonide in his nebulizer treatments. He feels that his breathing has been stable since the last visit, but has been using his rescue inhaler more. He still has not heard from pulmonary rehabilitation.   Review of Systems  Constitutional: Negative for fever and unexpected weight change.  HENT: Positive for rhinorrhea. Negative for congestion, dental problem, ear pain, nosebleeds, postnasal drip, sinus pressure, sneezing, sore throat and trouble swallowing.   Eyes: Negative for redness and itching.  Respiratory: Positive for chest tightness, shortness of breath and wheezing. Negative for cough.   Cardiovascular: Negative for palpitations and leg swelling.  Gastrointestinal: Negative for nausea and vomiting.  Genitourinary: Negative for dysuria.  Musculoskeletal: Negative for joint swelling.  Skin: Negative for rash.  Neurological: Negative for headaches.  Hematological: Does not bruise/bleed easily.  Psychiatric/Behavioral: Negative for dysphoric mood. The patient is not nervous/anxious.        Objective:   Physical Exam Obese male in no acute distress Nose without purulence or discharge noted Neck without lymphadenopathy or thyromegaly Chest with decreased breath sounds, no active wheezing Cardiac exam with regular rate and rhythm Lower extremities with edema noted, no cyanosis Alert and oriented, moves all 4 extremities.       Assessment & Plan:

## 2013-12-05 ENCOUNTER — Telehealth (HOSPITAL_COMMUNITY): Payer: Self-pay

## 2013-12-05 NOTE — Telephone Encounter (Signed)
I have called and left a message with [facility] to inquire about participation in Pulmonary Rehab. Will send letter in mail and follow up.

## 2013-12-13 ENCOUNTER — Encounter (HOSPITAL_COMMUNITY): Payer: Self-pay | Admitting: Emergency Medicine

## 2013-12-13 ENCOUNTER — Emergency Department (HOSPITAL_COMMUNITY)
Admission: EM | Admit: 2013-12-13 | Discharge: 2013-12-20 | Disposition: E | Payer: Commercial Managed Care - HMO | Attending: Emergency Medicine | Admitting: Emergency Medicine

## 2013-12-13 DIAGNOSIS — Z87891 Personal history of nicotine dependence: Secondary | ICD-10-CM | POA: Insufficient documentation

## 2013-12-13 DIAGNOSIS — Z7901 Long term (current) use of anticoagulants: Secondary | ICD-10-CM | POA: Insufficient documentation

## 2013-12-13 DIAGNOSIS — I252 Old myocardial infarction: Secondary | ICD-10-CM | POA: Insufficient documentation

## 2013-12-13 DIAGNOSIS — E785 Hyperlipidemia, unspecified: Secondary | ICD-10-CM | POA: Diagnosis not present

## 2013-12-13 DIAGNOSIS — E119 Type 2 diabetes mellitus without complications: Secondary | ICD-10-CM | POA: Diagnosis not present

## 2013-12-13 DIAGNOSIS — N189 Chronic kidney disease, unspecified: Secondary | ICD-10-CM | POA: Diagnosis not present

## 2013-12-13 DIAGNOSIS — Z7951 Long term (current) use of inhaled steroids: Secondary | ICD-10-CM | POA: Insufficient documentation

## 2013-12-13 DIAGNOSIS — I129 Hypertensive chronic kidney disease with stage 1 through stage 4 chronic kidney disease, or unspecified chronic kidney disease: Secondary | ICD-10-CM | POA: Diagnosis not present

## 2013-12-13 DIAGNOSIS — Z79899 Other long term (current) drug therapy: Secondary | ICD-10-CM | POA: Insufficient documentation

## 2013-12-13 DIAGNOSIS — I469 Cardiac arrest, cause unspecified: Secondary | ICD-10-CM | POA: Diagnosis present

## 2013-12-13 DIAGNOSIS — I2510 Atherosclerotic heart disease of native coronary artery without angina pectoris: Secondary | ICD-10-CM | POA: Insufficient documentation

## 2013-12-20 NOTE — Code Documentation (Signed)
Robert Davies paused, Dr. Juleen China at bedside with [place]. Asystole on the monitor.

## 2013-12-20 NOTE — ED Notes (Signed)
Bed control notified checklist is complete.

## 2013-12-20 NOTE — ED Notes (Addendum)
Per [facility], pt was found outside his house by neighbor, upon ems arrival pt was warm, asystole. Samuel Bouche in place upon EMS arrival. Pt given 6 epis by EMS, intubed 8.0, 27 cm at the lip. 16 L External Jugular, L IO, and L Hand access. Pt down for unknown time. Capnography 70 by ems, CBG 118. Cold saline running.

## 2013-12-20 NOTE — ED Provider Notes (Signed)
CSN: [id number]     Arrival date & time 12/27/2013  1324 History   First MD Initiated Contact with Patient 12-27-13 1333     No chief complaint on file.    (Consider location/radiation/quality/duration/timing/severity/associated sxs/prior Treatment) HPI   80 year old man with a history of NICM with chronic systolic HF, symptomatic bradycardia, COPD, hypertension, status post biventricular pacemaker insertion. He developed PM pocket infection and underwent extraction. Today he was found outside his residence by his neighbor. Unknown down time or circumstances. Felt warm to touch on EMS arrival. Initial rhythm asystole and has remained since. Epi x6. Intubated in field. L EJ IV placed. Cold saline started.    Past Medical History  Diagnosis Date  . Emphysema     Oxygen-dependent  . Chronic systolic heart failure   . Implantable cardiac defibrillator infection     July 2012; 03/2013  . Nonischemic cardiomyopathy     a. Catheterization 2005 no obstructive coronary disease;  b. 02/2009 Echo: EF 35-40%, Gr 2 DD, mild lvh.  Marland Kitchen HLD (hyperlipidemia)   . Essential hypertension, benign   . Myocardial infarction 1990's  . Peptic ulcer   . GI bleeding   . BPH (benign prostatic hypertrophy)   . DM (diabetes mellitus)   . Coronary atherosclerosis of native coronary artery     Nonobstructive  . Shortness of breath   . CHF (congestive heart failure)   . Chronic kidney disease    Past Surgical History  Procedure Laterality Date  . Adenoidectomy    . Doppler echocardiography  2005, 2011  . Cardiac defibrillator placement  11/27/10    "this is my 3rd defibrillator"  . Cervical disc surgery  1990's    "slipped"  . Laceration repair  ~ 1939    right leg  . Tonsillectomy  ~ 1940  . Eye surgery    . Cataract extraction      "left eye"  . Laparoscopic appendectomy N/A 08/16/2012    Procedure: APPENDECTOMY LAPAROSCOPIC;  Surgeon: Liz Malady, MD;  Location: [facility] OR;  Service: General;   Laterality: N/A;  . Appendectomy  08/16/2012  . Pacemaker lead removal N/A 04/04/2013    Procedure: PACEMAKER LEAD REMOVAL;  Surgeon: Marinus Maw, MD;  Location: [facility] OR;  Service: Cardiovascular;  Laterality: N/A;   Family History  Problem Relation Age of Onset  . Cancer Father   . Cancer Mother     Throat  . Lung cancer Daughter    History  Substance Use Topics  . Smoking status: Former Smoker -- 0.50 packs/day for 69 years    Types: Cigarettes    Quit date: 06/20/2013  . Smokeless tobacco: Never Used     Comment: started at age 70; smoked 1-2.5 ppd; quit 11/11  . Alcohol Use: No    Review of Systems  Level 5 caveat because pt is unresponsive.  Allergies  Adhesive and Vicodin  Home Medications   Prior to Admission medications   Medication Sig Start Date End Date Taking? Authorizing Provider  Albuterol Sulfate (PROAIR RESPICLICK) 108 (90 BASE) MCG/ACT AEPB Inhale 2 puffs into the lungs every 6 (six) hours as needed. 09/28/13   Barbaraann Share, MD  bisacodyl (BISACODYL) 5 MG EC tablet Take 10 mg by mouth at bedtime.    Historical Provider, MD  budesonide (PULMICORT) 0.5 MG/2ML nebulizer solution Take 2 mLs (0.5 mg total) by nebulization 2 (two) times daily. Dx 496 09/28/13   Barbaraann Share, MD  colchicine 0.6 MG tablet Take  0.6 mg by mouth daily.    Historical Provider, MD  Dorzolamide HCl-Timolol Mal (COSOPT OP) Apply 1 drop to eye 2 (two) times daily.     Historical Provider, MD  enalapril (VASOTEC) 10 MG tablet Take 1 tablet (10 mg total) by mouth daily. 12/10/11   Marinus MawGregg W Taylor, MD  famotidine (PEPCID) 20 MG tablet Take 20 mg by mouth 2 (two) times daily.     Historical Provider, MD  fluticasone (FLONASE) 50 MCG/ACT nasal spray Place 2 sprays into the nose daily.    Historical Provider, MD  furosemide (LASIX) 40 MG tablet Take 60 mg (1.5 tablets) in the morning and 40 mg (1 tablet) in the evening. 04/05/13   Brooke O Edmisten, PA-C  glimepiride (AMARYL) 2 MG tablet Take 2 mg  by mouth 2 (two) times daily.     Historical Provider, MD  hydrOXYzine (ATARAX/VISTARIL) 25 MG tablet Take 25 mg by mouth at bedtime as needed for anxiety.    Historical Provider, MD  ipratropium-albuterol (DUONEB) 0.5-2.5 (3) MG/3ML SOLN Take 3 mLs by nebulization 4 (four) times daily. Take at breakfast, lunch, dinner, bedtime everyday no matter what DX 496 09/28/13   Barbaraann ShareKeith M Clance, MD  iron polysaccharides (NIFEREX) 150 MG capsule Take 150 mg by mouth 2 (two) times daily.    Historical Provider, MD  metoprolol (TOPROL-XL) 50 MG 24 hr tablet Take 50 mg by mouth daily.      Historical Provider, MD  nitroGLYCERIN (NITROSTAT) 0.4 MG SL tablet Place 1 tablet (0.4 mg total) under the tongue every 5 (five) minutes as needed. 09/19/13   Lyn RecordsHenry W Smith III, MD  pantoprazole (PROTONIX) 40 MG tablet Take 40 mg by mouth daily.    Historical Provider, MD  polyethylene glycol (MIRALAX / GLYCOLAX) packet Take 17 g by mouth daily.    Historical Provider, MD  potassium chloride (KLOR-CON) 10 MEQ CR tablet Take 10 mEq by mouth 2 (two) times daily.      Historical Provider, MD  simvastatin (ZOCOR) 20 MG tablet Take 20 mg by mouth at bedtime.      Historical Provider, MD  sitaGLIPtin (JANUVIA) 50 MG tablet Take 50 mg by mouth daily.    Historical Provider, MD  Tamsulosin HCl (FLOMAX) 0.4 MG CAPS Take 0.4 mg by mouth daily.      Historical Provider, MD  traMADol (ULTRAM) 50 MG tablet Take 50 mg by mouth every 6 (six) hours as needed for moderate pain.    Historical Provider, MD  triamcinolone cream (KENALOG) 0.1 % Apply 1 application topically 2 (two) times daily.    Historical Provider, MD  warfarin (COUMADIN) 5 MG tablet Take 5 mg by mouth daily.     Historical Provider, MD   BP 0/0 mmHg  Pulse 0  Resp 0  Wt 203 lb (92.08 kg)  SpO2 0% Physical Exam  Constitutional:  Intubated. Unresponsive.   HENT:  Head: Normocephalic.  Abrasion to R eye brow. Small amount of blood from nose.   Eyes:  Dilated. Nonreactive.    Cardiovascular:  External compression device. No palpable pulses without.   Pulmonary/Chest:  Intubated. bagged easily. B/l breath sounds.   Abdominal: Soft. He exhibits no distension. There is no tenderness.  Musculoskeletal: He exhibits no edema.  Neurological:  GCS 3T  Skin: Skin is warm and dry.  Nursing note and vitals reviewed.   ED Course  Procedures (including critical care time)  Cardiopulmonary Resuscitation (CPR) Procedure Note Directed/Performed by: Raeford RazorKOHUT, Jillann Charette I personally directed  ancillary staff and/or performed CPR in an effort to regain return of spontaneous circulation and to maintain cardiac, neuro and systemic perfusion.    Labs Review Labs Reviewed - No data to display  Imaging Review No results found.   EKG Interpretation   Date/Time:  Tuesday 25-Dec-2013 13:27:16 EST Ventricular Rate:  0 PR Interval:    QRS Duration:   QT Interval:    QTC Calculation:   R Axis:   0 Text Interpretation:  Uncertain rhythm: review No further analysis  attempted - not enough leads could be measured Missing lead(s): V2 ED  PHYSICIAN INTERPRETATION AVAILABLE IN [facility] Confirmed by TEST,  Record ([id number]) on 12/15/2013 9:07:33 AM      MDM   Final diagnoses:  Cardiac arrest    83yM found down. Asystole on EMS arrival. Asystole on arrival to ED. GCS 3T. Bedside US w/o discernable cardiac activity. Extremely poor prognosis. Efforts discontinued. Pronounced dead.     Raeford Razor, MD Jan 02, 2014 [phone]

## 2013-12-20 NOTE — Progress Notes (Signed)
Responded to CPR in Progress. Patient was found faced down at home outside [place] . Patient died shortly after arriving in ED. Daughter and grand-daughter at bedside. Provided grief and emotional support to family.    January 08, 2014 1400  Clinical Encounter Type  Visited With Patient;Family;Health care provider  Visit Type Initial;Spiritual support;Death;ED;Trauma  Referral From Nurse  Spiritual Encounters  Spiritual Needs Prayer;Emotional;Grief support  Stress Factors  Family Stress Factors Health changes  Venida Jarvis, Chaplain,pager [phone]

## 2013-12-20 NOTE — Code Documentation (Signed)
Patient time of death occurred at 1326 

## 2013-12-20 DEATH — deceased

## 2013-12-28 ENCOUNTER — Encounter (HOSPITAL_COMMUNITY): Payer: Self-pay | Admitting: Internal Medicine

## 2014-03-29 ENCOUNTER — Ambulatory Visit: Payer: Commercial Managed Care - HMO | Admitting: Pulmonary Disease
